# Patient Record
Sex: Female | Born: 1970 | Race: Black or African American | Hispanic: No | Marital: Single | Smoking: Never smoker
Health system: Southern US, Community
[De-identification: ages and names within clinical notes are randomized; demographics above are authoritative.]

## PROBLEM LIST (undated history)

## (undated) DIAGNOSIS — I10 Essential (primary) hypertension: Secondary | ICD-10-CM

## (undated) DIAGNOSIS — I509 Heart failure, unspecified: Secondary | ICD-10-CM

## (undated) DIAGNOSIS — D332 Benign neoplasm of brain, unspecified: Secondary | ICD-10-CM

## (undated) DIAGNOSIS — E119 Type 2 diabetes mellitus without complications: Secondary | ICD-10-CM

## (undated) DIAGNOSIS — E669 Obesity, unspecified: Secondary | ICD-10-CM

## (undated) DIAGNOSIS — E1169 Type 2 diabetes mellitus with other specified complication: Secondary | ICD-10-CM

## (undated) DIAGNOSIS — E282 Polycystic ovarian syndrome: Secondary | ICD-10-CM

## (undated) HISTORY — DX: Type 2 diabetes mellitus with other specified complication: E11.69

## (undated) HISTORY — DX: Polycystic ovarian syndrome: E28.2

## (undated) HISTORY — DX: Obesity, unspecified: E66.9

## (undated) HISTORY — DX: Essential (primary) hypertension: I10

## (undated) HISTORY — DX: Type 2 diabetes mellitus without complications: E11.9

## (undated) HISTORY — DX: Heart failure, unspecified: I50.9

## (undated) HISTORY — PX: OTHER SURGICAL HISTORY: SHX169

## (undated) HISTORY — DX: Benign neoplasm of brain, unspecified: D33.2

---

## 2000-06-25 HISTORY — PX: OTHER SURGICAL HISTORY: SHX169

## 2000-07-24 ENCOUNTER — Inpatient Hospital Stay (HOSPITAL_COMMUNITY): Admission: RE | Admit: 2000-07-24 | Discharge: 2000-07-27 | Payer: Self-pay | Admitting: Neurosurgery

## 2003-03-31 ENCOUNTER — Ambulatory Visit (HOSPITAL_COMMUNITY): Admission: RE | Admit: 2003-03-31 | Discharge: 2003-03-31 | Payer: Self-pay | Admitting: Family Medicine

## 2004-08-01 ENCOUNTER — Ambulatory Visit: Payer: Self-pay | Admitting: Family Medicine

## 2005-02-26 ENCOUNTER — Ambulatory Visit: Payer: Self-pay | Admitting: Family Medicine

## 2005-08-29 ENCOUNTER — Ambulatory Visit: Payer: Self-pay | Admitting: Family Medicine

## 2006-01-22 ENCOUNTER — Ambulatory Visit: Payer: Self-pay | Admitting: Family Medicine

## 2006-02-05 ENCOUNTER — Ambulatory Visit: Payer: Self-pay | Admitting: Family Medicine

## 2006-03-07 ENCOUNTER — Encounter: Payer: Self-pay | Admitting: Family Medicine

## 2006-08-26 ENCOUNTER — Ambulatory Visit: Payer: Self-pay | Admitting: Family Medicine

## 2007-02-24 ENCOUNTER — Ambulatory Visit: Payer: Self-pay | Admitting: Family Medicine

## 2007-02-26 ENCOUNTER — Encounter: Payer: Self-pay | Admitting: Family Medicine

## 2007-06-19 ENCOUNTER — Encounter: Payer: Self-pay | Admitting: Family Medicine

## 2007-06-19 LAB — CONVERTED CEMR LAB
BUN: 13 mg/dL (ref 6–23)
Basophils Absolute: 0 10*3/uL (ref 0.0–0.1)
Basophils Relative: 1 % (ref 0–1)
CO2: 24 meq/L (ref 19–32)
Calcium: 9.5 mg/dL (ref 8.4–10.5)
Chloride: 102 meq/L (ref 96–112)
Cholesterol: 155 mg/dL (ref 0–200)
Creatinine, Ser: 1.02 mg/dL (ref 0.40–1.20)
Eosinophils Absolute: 0.2 10*3/uL (ref 0.0–0.7)
Eosinophils Relative: 3 % (ref 0–5)
Glucose, Bld: 125 mg/dL — ABNORMAL HIGH (ref 70–99)
HCT: 39.4 % (ref 36.0–46.0)
HDL: 38 mg/dL — ABNORMAL LOW (ref >39)
Hemoglobin: 12.7 g/dL (ref 12.0–15.0)
LDL Cholesterol: 88 mg/dL (ref 0–99)
Lymphocytes Relative: 19 % (ref 12–46)
Lymphs Abs: 1.5 10*3/uL (ref 0.7–4.0)
MCHC: 32.2 g/dL (ref 30.0–36.0)
MCV: 82.9 fL (ref 78.0–100.0)
Microalb, Ur: 3.85 mg/dL — ABNORMAL HIGH (ref 0.00–1.89)
Monocytes Absolute: 0.7 10*3/uL (ref 0.1–1.0)
Monocytes Relative: 9 % (ref 3–12)
Neutro Abs: 5.3 10*3/uL (ref 1.7–7.7)
Neutrophils Relative %: 69 % (ref 43–77)
Platelets: 331 10*3/uL (ref 150–400)
Potassium: 4.8 meq/L (ref 3.5–5.3)
RBC: 4.75 M/uL (ref 3.87–5.11)
RDW: 15.1 % (ref 11.5–15.5)
Sodium: 138 meq/L (ref 135–145)
Total CHOL/HDL Ratio: 4.1
Triglycerides: 146 mg/dL (ref <150)
VLDL: 29 mg/dL (ref 0–40)
WBC: 7.8 10*3/uL (ref 4.0–10.5)

## 2007-06-24 DIAGNOSIS — I10 Essential (primary) hypertension: Secondary | ICD-10-CM | POA: Insufficient documentation

## 2007-06-25 ENCOUNTER — Ambulatory Visit: Payer: Self-pay | Admitting: Family Medicine

## 2007-09-04 ENCOUNTER — Ambulatory Visit: Payer: Self-pay | Admitting: Family Medicine

## 2007-09-14 ENCOUNTER — Ambulatory Visit (HOSPITAL_COMMUNITY): Admission: RE | Admit: 2007-09-14 | Discharge: 2007-09-14 | Payer: Self-pay | Admitting: Family Medicine

## 2007-09-14 LAB — HM MAMMOGRAPHY: HM Mammogram: NORMAL

## 2007-11-11 ENCOUNTER — Encounter: Payer: Self-pay | Admitting: Family Medicine

## 2007-12-09 ENCOUNTER — Ambulatory Visit: Payer: Self-pay | Admitting: Family Medicine

## 2007-12-09 DIAGNOSIS — E1169 Type 2 diabetes mellitus with other specified complication: Secondary | ICD-10-CM | POA: Insufficient documentation

## 2007-12-09 DIAGNOSIS — E669 Obesity, unspecified: Secondary | ICD-10-CM

## 2007-12-09 DIAGNOSIS — E119 Type 2 diabetes mellitus without complications: Secondary | ICD-10-CM

## 2007-12-09 HISTORY — DX: Type 2 diabetes mellitus with other specified complication: E11.69

## 2007-12-09 HISTORY — DX: Type 2 diabetes mellitus without complications: E11.9

## 2007-12-09 LAB — HM DIABETES FOOT EXAM

## 2007-12-09 LAB — CONVERTED CEMR LAB
Glucose, Bld: 126 mg/dL
Hgb A1c MFr Bld: 7.5 %

## 2007-12-28 ENCOUNTER — Telehealth: Payer: Self-pay | Admitting: Family Medicine

## 2009-02-25 ENCOUNTER — Emergency Department (HOSPITAL_COMMUNITY): Admission: EM | Admit: 2009-02-25 | Discharge: 2009-02-25 | Payer: Self-pay | Admitting: Emergency Medicine

## 2009-02-25 DIAGNOSIS — D332 Benign neoplasm of brain, unspecified: Secondary | ICD-10-CM

## 2009-02-25 HISTORY — DX: Benign neoplasm of brain, unspecified: D33.2

## 2009-03-24 ENCOUNTER — Telehealth: Payer: Self-pay | Admitting: Family Medicine

## 2009-03-24 ENCOUNTER — Encounter: Payer: Self-pay | Admitting: Family Medicine

## 2009-04-06 ENCOUNTER — Ambulatory Visit: Payer: Self-pay | Admitting: Family Medicine

## 2009-04-19 ENCOUNTER — Ambulatory Visit: Payer: Self-pay | Admitting: Family Medicine

## 2009-04-19 ENCOUNTER — Telehealth: Payer: Self-pay | Admitting: Family Medicine

## 2009-04-19 DIAGNOSIS — R42 Dizziness and giddiness: Secondary | ICD-10-CM | POA: Insufficient documentation

## 2009-04-19 DIAGNOSIS — G4700 Insomnia, unspecified: Secondary | ICD-10-CM | POA: Insufficient documentation

## 2009-04-21 ENCOUNTER — Encounter: Payer: Self-pay | Admitting: Family Medicine

## 2009-07-18 ENCOUNTER — Encounter: Payer: Self-pay | Admitting: Family Medicine

## 2009-07-18 ENCOUNTER — Telehealth (INDEPENDENT_AMBULATORY_CARE_PROVIDER_SITE_OTHER): Payer: Self-pay | Admitting: *Deleted

## 2009-07-26 ENCOUNTER — Telehealth: Payer: Self-pay | Admitting: Family Medicine

## 2009-07-26 ENCOUNTER — Encounter: Payer: Self-pay | Admitting: Family Medicine

## 2009-07-26 ENCOUNTER — Telehealth: Payer: Self-pay | Admitting: Physician Assistant

## 2009-07-27 ENCOUNTER — Encounter: Payer: Self-pay | Admitting: Family Medicine

## 2009-07-28 ENCOUNTER — Telehealth: Payer: Self-pay | Admitting: Physician Assistant

## 2009-08-16 ENCOUNTER — Encounter: Payer: Self-pay | Admitting: Family Medicine

## 2009-08-25 ENCOUNTER — Encounter: Payer: Self-pay | Admitting: Family Medicine

## 2009-09-12 ENCOUNTER — Encounter: Payer: Self-pay | Admitting: Family Medicine

## 2009-09-18 ENCOUNTER — Telehealth: Payer: Self-pay | Admitting: Family Medicine

## 2009-09-28 ENCOUNTER — Encounter: Payer: Self-pay | Admitting: Family Medicine

## 2009-10-11 ENCOUNTER — Encounter: Payer: Self-pay | Admitting: Family Medicine

## 2009-10-25 ENCOUNTER — Encounter: Payer: Self-pay | Admitting: Family Medicine

## 2009-12-04 ENCOUNTER — Telehealth: Payer: Self-pay | Admitting: Family Medicine

## 2009-12-22 ENCOUNTER — Telehealth: Payer: Self-pay | Admitting: Family Medicine

## 2009-12-25 ENCOUNTER — Telehealth: Payer: Self-pay | Admitting: Family Medicine

## 2009-12-27 ENCOUNTER — Telehealth: Payer: Self-pay | Admitting: Family Medicine

## 2010-01-02 ENCOUNTER — Encounter: Payer: Self-pay | Admitting: Family Medicine

## 2010-01-05 ENCOUNTER — Telehealth: Payer: Self-pay | Admitting: Family Medicine

## 2010-01-08 ENCOUNTER — Encounter: Payer: Self-pay | Admitting: Family Medicine

## 2010-02-08 ENCOUNTER — Encounter: Payer: Self-pay | Admitting: Family Medicine

## 2010-02-14 ENCOUNTER — Telehealth: Payer: Self-pay | Admitting: Family Medicine

## 2010-02-16 ENCOUNTER — Emergency Department (HOSPITAL_COMMUNITY)
Admission: EM | Admit: 2010-02-16 | Discharge: 2010-02-17 | Payer: Self-pay | Source: Home / Self Care | Admitting: Emergency Medicine

## 2010-02-20 ENCOUNTER — Telehealth: Payer: Self-pay | Admitting: Family Medicine

## 2010-02-20 ENCOUNTER — Ambulatory Visit
Admission: RE | Admit: 2010-02-20 | Discharge: 2010-02-20 | Payer: Self-pay | Source: Home / Self Care | Attending: Family Medicine | Admitting: Family Medicine

## 2010-02-20 ENCOUNTER — Encounter: Payer: Self-pay | Admitting: Family Medicine

## 2010-02-20 DIAGNOSIS — R059 Cough, unspecified: Secondary | ICD-10-CM | POA: Insufficient documentation

## 2010-02-20 DIAGNOSIS — R05 Cough: Secondary | ICD-10-CM

## 2010-02-22 ENCOUNTER — Ambulatory Visit
Admission: RE | Admit: 2010-02-22 | Discharge: 2010-02-22 | Payer: Self-pay | Source: Home / Self Care | Attending: Internal Medicine | Admitting: Internal Medicine

## 2010-02-27 ENCOUNTER — Encounter: Payer: Self-pay | Admitting: Family Medicine

## 2010-03-06 ENCOUNTER — Encounter: Payer: Self-pay | Admitting: Family Medicine

## 2010-03-13 ENCOUNTER — Encounter: Payer: Self-pay | Admitting: Family Medicine

## 2010-03-19 ENCOUNTER — Telehealth: Payer: Self-pay | Admitting: Family Medicine

## 2010-03-27 ENCOUNTER — Encounter: Payer: Self-pay | Admitting: Family Medicine

## 2010-03-27 NOTE — Letter (Signed)
Summary: lab  lab   Imported By: Curtis Sites 09/21/2009 13:10:49  _____________________________________________________________________  External Attachment:    Type:   Image     Comment:   External Document

## 2010-03-27 NOTE — Progress Notes (Signed)
  Phone Note Call from Patient   Caller: Patient Summary of Call: mostly dry cough but some yellow sputum, head congestion, cough is hurting throat and chest, no fever, no body aches [facility]. [phone] at work Initial call taken by: Adella Hare LPN,  December 25, 2009 8:25 AM  Follow-up for Phone Call        pls advise and erx tessalon perles 100mg .tidcap #30 refill zero, if worsens needs ov Follow-up by: Syliva Overman MD,  December 25, 2009 2:12 PM  Additional Follow-up for Phone Call Additional follow up Details #1::        med sent, called patient, no answer work or home Additional Follow-up by: Adella Hare LPN,  December 25, 2009 3:48 PM    New/Updated Medications: TESSALON PERLES 100 MG CAPS (BENZONATATE) one cap by mouth three times a day Prescriptions: TESSALON PERLES 100 MG CAPS (BENZONATATE) one cap by mouth three times a day  #30 x 0   Entered by:   Adella Hare LPN   Authorized by:   Syliva Overman MD   Signed by:   Adella Hare LPN on 60/45/4098   Method used:   Electronically to        [facility] (retail)       [address]       [place], [place]  [address]       Ph: [phone]       Fax: [phone]   RxID:   [id number]

## 2010-03-27 NOTE — Progress Notes (Signed)
Summary: [facility]  [facility]   Imported By: Lind Guest 10/13/2009 13:42:38  _____________________________________________________________________  External Attachment:    Type:   Image     Comment:   External Document

## 2010-03-27 NOTE — Letter (Signed)
Summary: misc.  misc.   Imported By: Curtis Sites 09/21/2009 13:11:05  _____________________________________________________________________  External Attachment:    Type:   Image     Comment:   External Document

## 2010-03-27 NOTE — Progress Notes (Signed)
Summary: speak with nurse  Phone Note Call from Patient   Summary of Call: needs to speak with nurse about having dizzy neck stiff and heart isn't beating right. tried to give appt. but she wanted to talk with doc about this because they have before. [phone] Initial call taken by: Rudene Anda,  April 19, 2009 2:11 PM  Follow-up for Phone Call        patient coming in today to see PA Follow-up by: Adella Hare LPN,  April 19, 2009 2:18 PM

## 2010-03-27 NOTE — Progress Notes (Signed)
Summary: NEUROSURGERY  NEUROSURGERY   Imported By: Lind Guest 10/04/2009 11:38:02  _____________________________________________________________________  External Attachment:    Type:   Image     Comment:   External Document

## 2010-03-27 NOTE — Progress Notes (Signed)
Summary: brain surgery  Phone Note Call from Patient   Summary of Call: having brain surgery nov 18 at [facility] wanted u to know Initial call taken by: Lind Guest,  December 04, 2009 11:44 AM  Follow-up for Phone Call        pls wishe her all the best for me and thanks for letting meknow Follow-up by: Syliva Overman MD,  December 04, 2009 12:08 PM  Additional Follow-up for Phone Call Additional follow up Details #1::        LEFT MESSAGE

## 2010-03-27 NOTE — Progress Notes (Signed)
Summary: rx  Phone Note Call from Patient   Summary of Call: left message needs her medicines refilled that was sent over there Initial call taken by: Lind Guest,  December 22, 2009 1:08 PM    Prescriptions: ZOLPIDEM TARTRATE 5 MG TABS (ZOLPIDEM TARTRATE) 1 hs as needed insomna  #90 x 0   Entered by:   Adella Hare LPN   Authorized by:   Syliva Overman MD   Signed by:   Adella Hare LPN on 27/25/3664   Method used:   Printed then faxed to ...       [facility] (retail)       [address]       [place], [place]  [address]       Ph: [phone]       Fax: [phone]   RxID:   [id number] AZOR 10-40 MG TABS (AMLODIPINE-OLMESARTAN) Take 1 tablet by mouth once a day  #90 x 0   Entered by:   Adella Hare LPN   Authorized by:   Syliva Overman MD   Signed by:   Adella Hare LPN on 35/57/3220   Method used:   Electronically to        [facility] (retail)       [address]       [place], [place]  [address]       Ph: [phone]       Fax: [phone]   RxID:   [id number] GLIPIZIDE 5 MG  TABS (GLIPIZIDE) one tab by mouth two times a day  #180 x 0   Entered by:   Adella Hare LPN   Authorized by:   Syliva Overman MD   Signed by:   Adella Hare LPN on 00/93/8182   Method used:   Electronically to        [facility] (retail)       [address]       [place], [place]  [address]       Ph: [phone]       Fax: [phone]   RxID:   [id number] KLOR-CON M20 20 MEQ  TBCR (POTASSIUM CHLORIDE CRYS CR) two tabs by mouth once daily  #180 x 0   Entered by:   Adella Hare LPN   Authorized by:   Syliva Overman MD   Signed by:   Adella Hare LPN on 40/09/6759   Method used:   Electronically to        [facility] (retail)       [address]       [place], [place]  [address]       Ph: [phone]       Fax: [phone]   RxID:    [id number] CLONIDINE HCL 0.3 MG  TABS (CLONIDINE HCL) two tabs by mouth three times a day  #540 x 0   Entered by:   Adella Hare LPN   Authorized by:   Syliva Overman MD   Signed by:   Adella Hare LPN on 73/53/2992   Method used:   Electronically to        [facility] (retail)       [address]       [place], [place]  [address]       Ph: [phone]       Fax: [phone]   RxID:   [id number] LASIX 40 MG  TABS (FUROSEMIDE) one tab by mouth two times a day  #180 x 0   Entered by:   Adella Hare LPN   Authorized by:   Syliva Overman MD   Signed by:   Adella Hare LPN on 84/13/2440   Method used:   Electronically to        [facility] (retail)       [address]       [place], [place]  [address]       Ph: [phone]       Fax: [phone]   RxID:   [id number]

## 2010-03-27 NOTE — Medication Information (Signed)
Summary: Tax adviser   Imported By: Lind Guest 01/08/2010 17:15:11  _____________________________________________________________________  External Attachment:    Type:   Image     Comment:   External Document

## 2010-03-27 NOTE — Progress Notes (Signed)
Summary: PRE AUTH #  Phone Note Call from Patient   Summary of Call: PRE AUTHORIZATION  #   IS  [id number] FOR THE MRI AND CT  GOOD THROUGH 6.1.11 -  6.30.11  AND THIS IS NOT A GUAR. OF PAYMENT  IF U NEED TO CALL BACK AT [phone] Initial call taken by: Lind Guest,  July 26, 2009 3:58 PM  Follow-up for Phone Call        pt going to wait and see what dr. Lovell Sheehan says before doing this. and if her recommends she will have him to do it.  Follow-up by: Rudene Anda,  July 27, 2009 11:39 AM

## 2010-03-27 NOTE — Letter (Signed)
Summary: history and physical  history and physical   Imported By: Curtis Sites 09/21/2009 13:10:32  _____________________________________________________________________  External Attachment:    Type:   Image     Comment:   External Document

## 2010-03-27 NOTE — Progress Notes (Signed)
Summary: Christina Davenport  Christina Davenport   Imported By: Lind Guest 09/15/2009 15:15:34  _____________________________________________________________________  External Attachment:    Type:   Image     Comment:   External Document

## 2010-03-27 NOTE — Letter (Signed)
Summary: demographic  demographic   Imported By: Curtis Sites 09/21/2009 13:10:14  _____________________________________________________________________  External Attachment:    Type:   Image     Comment:   External Document

## 2010-03-27 NOTE — Medication Information (Signed)
Summary: Tax adviser   Imported By: Lind Guest 04/21/2009 14:46:10  _____________________________________________________________________  External Attachment:    Type:   Image     Comment:   External Document

## 2010-03-27 NOTE — Assessment & Plan Note (Signed)
Summary: office visit   Vital Signs:  Patient profile:   40 year old female Height:      65 inches Weight:      332.25 pounds BMI:     55.49 O2 Sat:      95 % Pulse rate:   100 / minute Pulse rhythm:   regular Resp:     16 per minute BP sitting:   140 / 72  (right arm)  Vitals Entered By: Everitt Amber (April 06, 2009 3:20 PM)  Nutrition Counseling: Patient's BMI is greater than 25 and therefore counseled on weight management options. CC: Follow up chronic problems Is Patient Diabetic? Yes   CC:  Follow up chronic problems.  History of Present Illness: See in the Ed due to facial twitching and palpitations, on 02/25/2009, the twitching is allegedly a possible afterv effect from brain surgery, she has had no more episodes and states she will be walking regularly as this prevents the twitch. Reports  that she has been doing well. 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 Denies recent fever or chills. Denies sinus pressure, nasal congestion , ear pain or sore throat. Denies chest congestion, or cough productive of sputum. Denies chest pain, palpitations, PND, orthopnea or leg swelling. Denies abdominal pain, nausea, vomitting, diarrhea or constipation. Denies change in bowel movements or bloody stool. Denies dysuria , frequency, incontinence or hesitancy. Denies  joint pain, swelling, or reduced mobility. Denies headaches, vertigo, seizures. Denies depression, anxiety or insomnia. Denies  rash, lesions, or itch. she has not been exerfcising and has not lost weight as she had planned, she did recently invbest in a treadmill and intends to change this     Current Medications (verified): 1)  Lasix 40 Mg  Tabs (Furosemide) .... One Tab By Mouth Two Times A Day 2)  Clonidine Hcl 0.3 Mg  Tabs (Clonidine Hcl) .... Two Tabs By Mouth Three Times A Day 3)  Klor-Con M20 20 Meq  Tbcr (Potassium Chloride Crys Cr) .... Two Tabs By Mouth Once  Daily 4)  Glipizide 5 Mg  Tabs (Glipizide) .... One Tab By Mouth Two Times A Day 5)  Azor 10-40 Mg Tabs (Amlodipine-Olmesartan) .... Take 1 Tablet By Mouth Once A Day  Allergies (verified): 1)  ! Vasotec 2)  ! Glucophage  Review of Systems      See HPI Eyes:  Denies blurring and discharge. Derm:  Denies lesion(s) and rash. Neuro:  Denies headaches, seizures, and sensation of room spinning; neew  onset left facial twitch, eye, lower lip, post left neck intermittent since Jan 2011. Psych:  Denies anxiety and depression. Endo:  Denies cold intolerance, excessive hunger, excessive thirst, excessive urination, heat intolerance, polyuria, and weight change; tests daily fastings, range 110 to 127. Heme:  Denies abnormal bruising and bleeding. Allergy:  Denies hives or rash.  Physical Exam  General:  Well-developed,morbidly obese,in no acute distress; alert,appropriate and cooperative throughout examination HEENT: No facial asymmetry,  EOMI, No sinus tenderness, TM's Clear, oropharynx  pink and moist.   Chest: Clear to auscultation bilaterally.  CVS: S1, S2, No murmurs, No S3.   Abd: Soft, Nontender.  MS: Adequate ROM spine, hips, shoulders and knees.  Ext: No edema.   CNS: CN 2-12 intact, power tone and sensation normal throughout.   Skin: Intact, no visible lesions or rashes.  Psych: Good eye contact, normal affect.  Memory intact, not anxious or depressed appearing.    Impression &  Recommendations:  Problem # 1:  DIABETES MELLITUS, TYPE II (ICD-250.00) Assessment Improved  Her updated medication list for this problem includes:    Glipizide 5 Mg Tabs (Glipizide) ..... One tab by mouth two times a day    Azor 10-40 Mg Tabs (Amlodipine-olmesartan) .Marland Kitchen... Take 1 tablet by mouth once a day  Labs Reviewed: Creat: 1.02 (06/19/2007)    Reviewed HgBA1c results: 5.4 (04/06/2009)  7.5 (12/09/2007)  Problem # 2:  OBESITY (ICD-278.00) Assessment: Unchanged  Ht: 65 (04/06/2009)   Wt:  332.25 (04/06/2009)   BMI: 55.49 (04/06/2009)  Problem # 3:  HYPERTENSION (ICD-401.9) Assessment: Improved  Her updated medication list for this problem includes:    Lasix 40 Mg Tabs (Furosemide) ..... One tab by mouth two times a day    Clonidine Hcl 0.3 Mg Tabs (Clonidine hcl) .Marland Kitchen..Marland Kitchen Two tabs by mouth three times a day    Azor 10-40 Mg Tabs (Amlodipine-olmesartan) .Marland Kitchen... Take 1 tablet by mouth once a day  Orders: T-Basic Metabolic Panel ([address])  BP today: 140/72 Prior BP: 142/94 (12/09/2007)  Labs Reviewed: K+: 4.8 (06/19/2007) Creat: : 1.02 (06/19/2007)   Chol: 155 (06/19/2007)   HDL: 38 (06/19/2007)   LDL: 88 (06/19/2007)   TG: 146 (06/19/2007)  Complete Medication List: 1)  Lasix 40 Mg Tabs (Furosemide) .... One tab by mouth two times a day 2)  Clonidine Hcl 0.3 Mg Tabs (Clonidine hcl) .... Two tabs by mouth three times a day 3)  Klor-con M20 20 Meq Tbcr (Potassium chloride crys cr) .... Two tabs by mouth once daily 4)  Glipizide 5 Mg Tabs (Glipizide) .... One tab by mouth two times a day 5)  Azor 10-40 Mg Tabs (Amlodipine-olmesartan) .... Take 1 tablet by mouth once a day  Other Orders: Hemoglobin A1C ([address]) T-Lipid Profile ([address]21-30865) T-CBC w/Diff ([id number]) T-Urine Microalbumin w/creat. ratio [id number])  Patient Instructions: 1)  F/u in 5 months and 3.5 weeks 2)  It is important that you exercise regularly at least 20 minutes 5 times a week. If you develop chest pain, have severe difficulty breathing, or feel very tired , stop exercising immediately and seek medical attention. 3)  You need to lose weight. Consider a lower calorie diet and regular exercise.  4)  BMP prior to visit, ICD-9: 5)  Lipid Panel prior to visit, ICD-9:  fasting asap 6)  CBC w/ Diff prior to visit, ICD-9: 7)  Urine Microalbumin prior to visit, ICD-9: 8)  Your sugar is great keep it up  Laboratory Results   Blood Tests   Date/Time Received: April 06, 2009  Date/Time  Reported: April 06, 2009   HGBA1C: 5.4%   (Normal Range: Non-Diabetic - 3-6%   Control Diabetic - 6-8%)

## 2010-03-27 NOTE — Letter (Signed)
Summary: progress notes  progress notes   Imported By: Curtis Sites 09/21/2009 13:11:45  _____________________________________________________________________  External Attachment:    Type:   Image     Comment:   External Document

## 2010-03-27 NOTE — Progress Notes (Signed)
Summary: APPT  Phone Note Call from Patient   Summary of Call: YOU GAVE ME HER LAB SHEET AND SAID NEEDS OV AND I CALLED HER AND SHE RETURNED MY CALL THIS MORNING AND SHE IS HAVING BRAIN SURGERY THURSDAY  AND SHE SAID SHE HAD TOLD YOU SEVERAL TIMES AND THAT THE OTHER DRS HD TOLD YOU WAS THERE SOMETHING  THAT SHE NEEDED TO KNOW  Initial call taken by: Lind Guest,  January 05, 2010 9:13 AM  Follow-up for Phone Call        spoke with pt, dose inc sent in for her glipizide due to uncontrolled blood sugars, she is aware. Follow-up by: Syliva Overman MD,  January 05, 2010 12:04 PM  Additional Follow-up for Phone Call Additional follow up Details #1::        pls call pt at her work for  an appt i 4 months , she is expecting the call Additional Follow-up by: Syliva Overman MD,  January 05, 2010 12:05 PM    Additional Follow-up for Phone Call Additional follow up Details #2::    appt made for 3.21.12 @ 3:00 Follow-up by: Lind Guest,  January 05, 2010 3:31 PM  New/Updated Medications: GLIPIZIDE 5 MG TABS (GLIPIZIDE) one and a half tablets twice daily Prescriptions: GLIPIZIDE 5 MG TABS (GLIPIZIDE) one and a half tablets twice daily  #270 x 1   Entered and Authorized by:   Syliva Overman MD   Signed by:   Syliva Overman MD on 01/05/2010   Method used:   Printed then faxed to ...       [facility] (retail)       [address]       [place], [place]  [address]       Ph: [phone]       Fax: [phone]   RxID:   [id number]

## 2010-03-27 NOTE — Progress Notes (Signed)
Summary: fyi  Phone Note Call from Patient   Summary of Call: patient called in this morning, states she has tried to get in since Monday, she has something going on cold like symptoms, she is scheduled for brain surgery on the 18th of this month.  She is going to urgent care this morning, so that the antibiotics they prescribe will be out of her system before her surgery.  She did say that Dr. Lodema Hong called her in something for a cough, but she is going to Urgent care. Initial call taken by: Curtis Sites,  December 27, 2009 8:24 AM  Follow-up for Phone Call        NOTED, THE INITIAL MSG WAS NOT TO BE SEEN HERE Follow-up by: Syliva Overman MD,  December 27, 2009 8:12 PM

## 2010-03-27 NOTE — Letter (Signed)
Summary: Appt with Dr. Lovell Sheehan  Appt with Dr. Lovell Sheehan   Imported By: Rudene Anda 07/27/2009 11:44:21  _____________________________________________________________________  External Attachment:    Type:   Image     Comment:   External Document

## 2010-03-27 NOTE — Assessment & Plan Note (Signed)
Summary: sick- room 2   Vital Signs:  Patient profile:   40 year old female Height:      65 inches Weight:      326.50 pounds BMI:     54.53 O2 Sat:      100 % Pulse rate:   100 / minute Resp:     16 per minute BP supine:   160 / 90  (left arm) BP sitting:   140 / 84  (left arm) BP standing:   140 / 80  (left arm)  Vitals Entered By: Adella Hare LPN (April 19, 2009 3:34 PM)  Nutrition Counseling: Patient's BMI is greater than 25 and therefore counseled on weight management options. CC: dizzy, insomnia, neck pain Is Patient Diabetic? Yes Did you bring your meter with you today? Yes Pain Assessment Patient in pain? yes     Location: neck Intensity: 6 Type: stiffness Onset of pain  Constant   CC:  dizzy, insomnia, and neck pain.  History of Present Illness: Pt is in today with complaints of unable to sleep since January of this year.  No prev hx of insomnia.  States she has probs falling asleep, and will lie in bed for a couple of hrs before she falls asleep.  Then awakens early am.  Usually about 4:00.  Awakens suddenly with mind racing about things she might need to do, etc.  Pt has a hx of dizziness x yrs.  Mostly with change of positions.  She has had Antivert in the past which did seem to help.  Pt has been exercising regulaly.  Doing a mile per day on her treadmill.  She is working full time, and in Jan started classes for [employer].  She doesn't feel overwhelmed with this.   Current Medications (verified): 1)  Lasix 40 Mg  Tabs (Furosemide) .... One Tab By Mouth Two Times A Day 2)  Clonidine Hcl 0.3 Mg  Tabs (Clonidine Hcl) .... Two Tabs By Mouth Three Times A Day 3)  Klor-Con M20 20 Meq  Tbcr (Potassium Chloride Crys Cr) .... Two Tabs By Mouth Once Daily 4)  Glipizide 5 Mg  Tabs (Glipizide) .... One Tab By Mouth Two Times A Day 5)  Azor 10-40 Mg Tabs (Amlodipine-Olmesartan) .... Take 1 Tablet By Mouth Once A Day  Allergies (verified): 1)  ! Vasotec 2)  !  Glucophage PMH-FH-SH reviewed for relevance  Review of Systems General:  Denies chills, fatigue, and fever. Eyes:  Denies blurring and double vision. ENT:  Denies earache, sinus pressure, and sore throat. CV:  Denies chest pain or discomfort. Resp:  Denies shortness of breath. Neuro:  Denies numbness and tingling. Psych:  Denies anxiety and depression.  Physical Exam  General:  Well-developed,well-nourished,in no acute distress; alert,appropriate and cooperative throughout examination Head:  Normocephalic and atraumatic without obvious abnormalities. No apparent alopecia or balding. Ears:  External ear exam shows no significant lesions or deformities.  Otoscopic examination reveals clear canals, tympanic membranes are intact bilaterally without bulging, retraction, inflammation or discharge. Hearing is grossly normal bilaterally. Nose:  External nasal examination shows no deformity or inflammation. Nasal mucosa are pink and moist without lesions or exudates. Mouth:  Oral mucosa and oropharynx without lesions or exudates.  Teeth in good repair. Neck:  No deformities, masses, or tenderness noted. Lungs:  Normal respiratory effort, chest expands symmetrically. Lungs are clear to auscultation, no crackles or wheezes. Heart:  Normal rate and regular rhythm. S1 and S2 normal without gallop, murmur, click, rub  or other extra sounds. Cervical Nodes:  No lymphadenopathy noted Psych:  Cognition and judgment appear intact. Alert and cooperative with normal attention span and concentration. No apparent delusions, illusions, hallucinations   Impression & Recommendations:  Problem # 1:  INSOMNIA (ICD-780.52) Assessment New Discussed with pt probably due to increase in stress.  Will use Ambien as needed, not nightly.  Her updated medication list for this problem includes:    Zolpidem Tartrate 5 Mg Tabs (Zolpidem tartrate) .Marland Kitchen... 1 hs as needed insomna  Problem # 2:  DIZZINESS, CHRONIC  (ICD-780.4) Assessment: Unchanged  Her updated medication list for this problem includes:    Meclizine Hcl 25 Mg Tabs (Meclizine hcl) .Marland Kitchen... 1/2 - 1 tab q 8 hrs as needed dizziness  Problem # 3:  HYPERTENSION (ICD-401.9) Assessment: Unchanged  Her updated medication list for this problem includes:    Lasix 40 Mg Tabs (Furosemide) ..... One tab by mouth two times a day    Clonidine Hcl 0.3 Mg Tabs (Clonidine hcl) .Marland Kitchen..Marland Kitchen Two tabs by mouth three times a day    Azor 10-40 Mg Tabs (Amlodipine-olmesartan) .Marland Kitchen... Take 1 tablet by mouth once a day  BP today: 140/84 Prior BP: 140/72 (04/06/2009)  Labs Reviewed: K+: 4.8 (06/19/2007) Creat: : 1.02 (06/19/2007)   Chol: 155 (06/19/2007)   HDL: 38 (06/19/2007)   LDL: 88 (06/19/2007)   TG: 146 (06/19/2007)  Problem # 4:  OBESITY (ICD-278.00) Assessment: Improved Pt has lost 6 lbs since her prev visit. Congratulated her & encouraged her to continue.  Complete Medication List: 1)  Lasix 40 Mg Tabs (Furosemide) .... One tab by mouth two times a day 2)  Clonidine Hcl 0.3 Mg Tabs (Clonidine hcl) .... Two tabs by mouth three times a day 3)  Klor-con M20 20 Meq Tbcr (Potassium chloride crys cr) .... Two tabs by mouth once daily 4)  Glipizide 5 Mg Tabs (Glipizide) .... One tab by mouth two times a day 5)  Azor 10-40 Mg Tabs (Amlodipine-olmesartan) .... Take 1 tablet by mouth once a day 6)  Zolpidem Tartrate 5 Mg Tabs (Zolpidem tartrate) .Marland Kitchen.. 1 hs as needed insomna 7)  Meclizine Hcl 25 Mg Tabs (Meclizine hcl) .... 1/2 - 1 tab q 8 hrs as needed dizziness  Patient Instructions: 1)  It is important that you exercise regularly at least 20 minutes 5 times a week. If you develop chest pain, have severe difficulty breathing, or feel very tired , stop exercising immediately and seek medical attention. 2)  Continue with weight loss.  Congratulations! 3)  I have sent your prescriptions to the pharmacy for you. 4)  Keep your follow up appt.  We will see you sooner  if your problems persist. Prescriptions: MECLIZINE HCL 25 MG TABS (MECLIZINE HCL) 1/2 - 1 tab q 8 hrs as needed dizziness  #30 x 1   Entered and Authorized by:   Esperanza Sheets PA   Signed by:   Esperanza Sheets PA on 04/19/2009   Method used:   Printed then faxed to ...       [facility] (retail)       [address]       [place], [place]  [address]       Ph: [phone]       Fax: [phone]   RxID:   [id number] ZOLPIDEM TARTRATE 5 MG TABS (ZOLPIDEM TARTRATE) 1 hs as needed insomna  #30 x 1   Entered and  Authorized by:   Esperanza Sheets PA   Signed by:   Esperanza Sheets PA on 04/19/2009   Method used:   Printed then faxed to ...       [facility] ([facility]ail)       [address]       [place], [place]  [address]       Ph: [phone]       Fax: [phone]   RxID:   [id number]

## 2010-03-27 NOTE — Progress Notes (Signed)
Summary: CALL  Phone Note Call from Patient   Summary of Call: WANTS YOU TO CALL HER AT [phone] Initial call taken by: Lind Guest,  September 18, 2009 1:30 PM  Follow-up for Phone Call        patient is preparing for brain surgery, has been unable to come in for ov due to multiple neuro appts, needs refills, advised will send 90 scripts to give time for recovery Follow-up by: Adella Hare LPN,  September 18, 2009 5:07 PM    Prescriptions: ZOLPIDEM TARTRATE 5 MG TABS (ZOLPIDEM TARTRATE) 1 hs as needed insomna  #90 x 0   Entered by:   Adella Hare LPN   Authorized by:   Syliva Overman MD   Signed by:   Adella Hare LPN on 11/91/4782   Method used:   Printed then faxed to ...       [facility] ([facility]ail[facility]       [address]       [place], [place]  [address]       Ph: [phone]       Fax: [phone]   RxID:   [id number] AZOR 10-40 MG TABS (AMLODIPINE-OLMESARTAN) Take 1 tablet by mouth once a day  #90 x 0   Entered by:   Adella Hare LPN   Authorized by:   Syliva Overman MD   Signed by:   Adella Hare LPN on 47/42/5956   Method used:   Electronically to        [facility] (retail)       [address]       [place], [place]  [address]       Ph: [phone]       Fax: [phone]   RxID:   [id number] GLIPIZIDE 5 MG  TABS (GLIPIZIDE) one tab by mouth two times a day  #180 x 0   Entered by:   Adella Hare LPN   Authorized by:   Syliva Overman MD   Signed by:   Adella Hare LPN on 27/07/2374   Method used:   Electronically to        [facility] (retail)       [address]       [place], [place]  [address]       Ph: [phone]       Fax: [phone]   RxID:   [id number] KLOR-CON M20 20 MEQ  TBCR (POTASSIUM CHLORIDE CRYS CR) two tabs by mouth once daily  #180 x 0   Entered by:   Adella Hare LPN   Authorized by:   Syliva Overman MD   Signed by:   Adella Hare LPN on 71/69/6789   Method used:   Electronically to        [facility] [address] (retail)       [address]       [place], [place]  [address]       Ph: [phone]       Fax: [phone]   RxID:   [id number] CLONIDINE HCL 0.3 MG  TABS (CLONIDINE HCL) two tabs by mouth three times a day  #540 x 0   Entered by:   Adella Hare LPN   Authorized by:   Syliva Overman MD  Signed by:   Adella Hare LPN on 95/10/3265   Method used:   Electronically to        [facility] (retail)       [address]       [place], [place]  [address]       Ph: [phone]       Fax: [phone]   RxID:   [id number] LASIX 40 MG  TABS (FUROSEMIDE) one tab by mouth two times a day  #180 x 0   Entered by:   Adella Hare LPN   Authorized by:   Syliva Overman MD   Signed by:   Adella Hare LPN on 42/68/3419   Method used:   Electronically to        [facility] [address] (retail)       [address]       [place], [place]  [address]       Ph: [phone]       Fax: [phone]   RxID:   [id number]

## 2010-03-27 NOTE — Letter (Signed)
Summary: interventional radiology  interventional radiology   Imported By: Lind Guest 01/08/2010 16:50:41  _____________________________________________________________________  External Attachment:    Type:   Image     Comment:   External Document

## 2010-03-27 NOTE — Progress Notes (Signed)
  Phone Note Other Incoming   Summary of Call: Phone call from radiologist.  Pt had MRI brain last night. Shows a 4 cm mass Lt, by brainstem, with shift.  Needs 1) an MRI with contrast, and 2) temporal bone CT.  These are needed ASAP Also refer pt to Neurosurg for consult. Dx: Brain mass, New onset HA, & abn vision exam. Initial call taken by: Esperanza Sheets PA,  July 26, 2009 9:52 AM     Appended Document:  Discussed results with Pt. She states she has not been having headaches.  She has been experiencing visual changes, and problems with balance and clumsiness.  Appended Document:  pt has appt with dr. Lovell Sheehan for 08/16/2009 9:50. pt is aware and said that she didn't want to to another MRI with contrast are a temprol ct scan. She stated dr. Lovell Sheehan done her last surgery and she would like to see him first before she does any of this. And if dr. Lovell Sheehan thinks it needs to be done then he will do it. Told pt I would let you know and Dr. Lodema Hong

## 2010-03-27 NOTE — Progress Notes (Signed)
  Phone Note Outgoing Call   Summary of Call: Called Dr Lovell Sheehan office. l spoke with Suzie who states Dr Lovell Sheehan did review the pts information, and MRI and does not feel that she needs an appt before 08-16-09. Initial call taken by: Esperanza Sheets PA,  July 28, 2009 10:09 AM

## 2010-03-27 NOTE — Letter (Signed)
Summary: phone notes  phone notes   Imported By: Curtis Sites 09/21/2009 13:11:27  _____________________________________________________________________  External Attachment:    Type:   Image     Comment:   External Document

## 2010-03-27 NOTE — Progress Notes (Signed)
Summary: [facility]  [facility]   Imported By: Lind Guest 09/04/2009 13:48:48  _____________________________________________________________________  External Attachment:    Type:   Image     Comment:   External Document

## 2010-03-27 NOTE — Progress Notes (Signed)
  Phone Note Other Incoming   Caller: dr Ila Landowski Summary of Call: this pt has not been in the office since 10/09. We have made 2 unsuccesful attempts to contact her, 2 msgs have been left for her to call the office fplor an appt with no response, [facility] is requesting a 90 day supply of meds which I am unablet to fill, due to the fact that she has not been evaluatd in over 1 yr.  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

## 2010-03-27 NOTE — Consult Note (Signed)
Summary: NEUOSURGICAL  NEUOSURGICAL   Imported By: Lind Guest 08/24/2009 09:16:42  _____________________________________________________________________  External Attachment:    Type:   Image     Comment:   External Document

## 2010-03-27 NOTE — Progress Notes (Signed)
Summary: eye exam  eye exam   Imported By: Lind Guest 07/25/2009 08:45:48  _____________________________________________________________________  External Attachment:    Type:   Image     Comment:   External Document

## 2010-03-27 NOTE — Progress Notes (Signed)
Summary: wanting a MRI  Phone Note Call from Patient   Summary of Call: pt would like to get a open MRI of head. Said she went to eye doc and told her she had muscles in eye not tracking like they should. said maybe came from where she had surgery in past. please call her back. [phone] Initial call taken by: Rudene Anda,  Jul 18, 2009 1:40 PM  Follow-up for Phone Call        ok to refer her for mRI of brain , new headaches , abn eye exam, s/p brain surgery for chiari malformation, pls let her know , if you can order pls do, if not have them know that they will geta call on thursday about it Follow-up by: Syliva Overman MD,  Jul 18, 2009 5:14 PM  Additional Follow-up for Phone Call Additional follow up Details #1::        not sure if [facility] needs precert will forward to [facility] and let patient know  called patient, left message Additional Follow-up by: Adella Hare LPN,  Jul 19, 2009 10:42 AM    Additional Follow-up for Phone Call Additional follow up Details #2::    Patient aware that precert has to be done first to see if they will cover. Told her we would call her back after that  Follow-up by: Everitt Amber LPN,  Jul 19, 2009 11:10 AM   Appended Document: wanting a MRI pt has a appt at [facility] for 07/21/2009 2:30. called pt and left message for her. and if she had any questions to please call back.   Appended Document: wanting a MRI spoke with pt and she is having test at [facility] at 8pm, she rescheduled,  pLS f/u on therept and directlygive to salllie if abn to fax to the pt's neurosurgeon for an earlier appt than she was able to make 9approx 1 mth out0 She has had arnold chiari brain surgery in the past and a recent opthal eval suggested that she needed urgent neurosurg eval, she was seen last week by them, thanks  pt has been asked to contact the officeWed afternoon if she has not heard from [place] about this , it is impt

## 2010-03-29 NOTE — Progress Notes (Signed)
Summary: change pills to liquid  Phone Note Call from Patient   Summary of Call: pt needs to speak with nurse about brain surgery. needs her medicines in liquid form. [phone] Initial call taken by: Rudene Anda,  March 19, 2010 2:10 PM  Follow-up for Phone Call        unable to swallow tabs of potassium, will cjange to liquid, she is aware Follow-up by: Syliva Overman MD,  March 19, 2010 5:19 PM    New/Updated Medications: POTASSIUM CHLORIDE 20 MEQ/15ML (10%) LIQD (POTASSIUM CHLORIDE) 30 ml once daily, stop potassium tablets effective 03/19/2010 Prescriptions: POTASSIUM CHLORIDE 20 MEQ/15ML (10%) LIQD (POTASSIUM CHLORIDE) 30 ml once daily, stop potassium tablets effective 03/19/2010  #935ml x 3   Entered and Authorized by:   Syliva Overman MD   Signed by:   Syliva Overman MD on 03/19/2010   Method used:   Electronically to        [facility] (retail)       [address]       [place], [place]  [address]       Ph: [phone]       Fax: [phone]   RxID:   [id number]

## 2010-03-29 NOTE — Letter (Signed)
Summary: otolaryngology  otolaryngology   Imported By: Lind Guest 03/20/2010 11:07:01  _____________________________________________________________________  External Attachment:    Type:   Image     Comment:   External Document

## 2010-03-29 NOTE — Letter (Signed)
Summary: Discharge Summary  Discharge Summary   Imported By: Lind Guest 03/19/2010 15:39:50  _____________________________________________________________________  External Attachment:    Type:   Image     Comment:   External Document

## 2010-03-29 NOTE — Letter (Signed)
Summary: Discharge Summary  Discharge Summary   Imported By: Lind Guest 03/15/2010 14:56:51  _____________________________________________________________________  External Attachment:    Type:   Image     Comment:   External Document

## 2010-03-29 NOTE — Letter (Signed)
Summary: appt from [facility]  appt from [facility]   Imported By: Lind Guest 02/20/2010 14:37:12  _____________________________________________________________________  External Attachment:    Type:   Image     Comment:   External Document

## 2010-03-29 NOTE — Progress Notes (Signed)
Summary: SPEECH  Phone Note Call from Patient   Summary of Call: Christina Davenport with [facility] wants verbal order to continue with speech therpy call back at [phone] Initial call taken by: Lind Guest,  February 14, 2010 3:55 PM  Follow-up for Phone Call        returned call, left message Follow-up by: Adella Hare LPN,  February 14, 2010 4:16 PM  Additional Follow-up for Phone Call Additional follow up Details #1::        advised okay Additional Follow-up by: Adella Hare LPN,  February 14, 2010 4:22 PM

## 2010-03-29 NOTE — Assessment & Plan Note (Signed)
Summary: cough   Vital Signs:  Patient profile:   40 year old female Height:      65 inches Weight:      305.75 pounds BMI:     51.06 O2 Sat:      96 % on Room air Pulse rate:   111 / minute Pulse rhythm:   regular Resp:     16 per minute BP sitting:   110 / 70  (left arm)  Vitals Entered By: Adella Hare LPN (February 20, 2010 10:47 AM)  Nutrition Counseling: Patient's BMI is greater than 25 and therefore counseled on weight management options.  O2 Flow:  Room air CC: dry cough Is Patient Diabetic? Yes Did you bring your meter with you today? No   CC:  dry cough.  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 She has h/h for speech and physical therapy.  She was seen in the ed on 12/23 and was told that herPEG is leaking, needs local gI involvement.  Dry hacking cough since mid nov, preveent s her from sleeping, no fever or chills.Her CXR at the Ed this past weekend is clear, and she has recently been on multiple antibiotics No upper respiratory symptoms. No urinary symptoms  Current Medications (verified): 1)  Lasix 40 Mg  Tabs (Furosemide) .... One Tab By Mouth Two Times A Day 2)  Clonidine Hcl 0.3 Mg  Tabs (Clonidine Hcl) .... Two Tabs By Mouth Three Times A Day 3)  Klor-Con M20 20 Meq  Tbcr (Potassium Chloride Crys Cr) .... Two Tabs By Mouth Once Daily 4)  Azor 10-40 Mg Tabs (Amlodipine-Olmesartan) .... Take 1 Tablet By Mouth Once A Day 5)  Zolpidem Tartrate 5 Mg Tabs (Zolpidem Tartrate) .Marland Kitchen.. 1 Hs As Needed Insomna 6)  Meclizine Hcl 25 Mg Tabs (Meclizine Hcl) .... 1/2 - 1 Tab Q 8 Hrs As Needed Dizziness 7)  Glipizide 5 Mg Tabs (Glipizide)  .... One and A Half Tablets Twice Daily  Allergies (verified): 1)  ! Vasotec 2)  ! Glucophage  Review of Systems      See HPI General:  Complains of fatigue and weakness. Eyes:  Complains of vision loss-1 eye; left eye. ENT:  Denies hoarseness, nasal congestion, postnasal drainage, and sinus pressure. CV:  Denies chest pain or discomfort, palpitations, and swelling of hands. Resp:  Complains of cough; denies sputum productive and wheezing. GI:  Denies abdominal pain, constipation, diarrhea, nausea, and vomiting; feelspeg is leaking. GU:  Denies dysuria and urinary frequency. MS:  Complains of muscle weakness. Derm:  Denies itching and rash. Neuro:  Complains of visual disturbances and weakness. Psych:  Complains of anxiety and depression; denies mental problems, suicidal thoughts/plans, thoughts of violence, and unusual visions or sounds; overwhelmed understandably by the multitude of complications. Endo:  Denies cold intolerance, excessive thirst, excessive urination, and heat intolerance. Heme:  Denies abnormal bruising and bleeding. Allergy:  Denies hives or rash and itching eyes.  Physical Exam  General:  Well-developed,obese,in no acute distress; alert,appropriate and cooperative throughout examination HEENT: No facial asymmetry,  EOMI, No sinus tenderness, TM's Clear, oropharynx  pink and moist.   Chest: Clear to auscultation bilaterally.  CVS:  S1, S2, No murmurs, No S3.   Abd: Soft, Nontender. Dry dressing at PEG site MS: Adequate ROM spine, hips, shoulders and knees.  Ext: No edema.   CNS: left facial weakness, power tone and sensation normal throughout.   Skin: Intact, no visible lesions or rashes.  Psych: Good eye contact, normal affect.  Memory intact, mildly  depressed appearing.    Impression & Recommendations:  Problem # 1:  COUGH (ICD-786.2) Assessment Comment Only tussionex prescribed, no evidence of infection  Problem # 2:  DIABETES MELLITUS, TYPE II  (ICD-250.00)  The following medications were removed from the medication list:    Glipizide 5 Mg Tabs (Glipizide) ..... One tab by mouth two times a day Her updated medication list for this problem includes:    Azor 10-40 Mg Tabs (Amlodipine-olmesartan) .Marland Kitchen... Take 1 tablet by mouth once a day    Glipizide 5 Mg Tabs (Glipizide) ..... One and a half tablets twice daily HBA1C at next OV Orders: Gastroenterology Referral (GI)  Problem # 3:  HYPERTENSION (ICD-401.9) Assessment: Improved  Her updated medication list for this problem includes:    Lasix 40 Mg Tabs (Furosemide) ..... One tab by mouth two times a day    Clonidine Hcl 0.3 Mg Tabs (Clonidine hcl) .Marland Kitchen..Marland Kitchen Two tabs by mouth three times a day    Azor 10-40 Mg Tabs (Amlodipine-olmesartan) .Marland Kitchen... Take 1 tablet by mouth once a day  BP today: 110/70 Prior BP: 140/80 (04/19/2009)  Labs Reviewed: K+: 4.8 (06/19/2007) Creat: : 1.02 (06/19/2007)   Chol: 155 (06/19/2007)   HDL: 38 (06/19/2007)   LDL: 88 (06/19/2007)   TG: 146 (06/19/2007)  Problem # 4:  OBESITY (ICD-278.00) Assessment: Improved  Ht: 65 (02/20/2010)   Wt: 305.75 (02/20/2010)   BMI: 51.06 (02/20/2010)  Complete Medication List: 1)  Lasix 40 Mg Tabs (Furosemide) .... One tab by mouth two times a day 2)  Clonidine Hcl 0.3 Mg Tabs (Clonidine hcl) .... Two tabs by mouth three times a day 3)  Klor-con M20 20 Meq Tbcr (Potassium chloride crys cr) .... Two tabs by mouth once daily 4)  Azor 10-40 Mg Tabs (Amlodipine-olmesartan) .... Take 1 tablet by mouth once a day 5)  Zolpidem Tartrate 5 Mg Tabs (Zolpidem tartrate) .Marland Kitchen.. 1 hs as needed insomna 6)  Meclizine Hcl 25 Mg Tabs (Meclizine hcl) .... 1/2 - 1 tab q 8 hrs as needed dizziness 7)  Glipizide 5 Mg Tabs (Glipizide) .... One and a half tablets twice daily 8)  Tussionex Pennkinetic Er 10-8 Mg/47ml Lqcr (Hydrocod polst-chlorphen polst) .... One teasponn twice daily as needed for cough  Patient Instructions: 1)  f/u as  before. 2)  Med sent in for cough. 3)  I will review CXR and labs from Ed visit on 12/23, if no evidence ofinfection no other meds will be sent in. 4)  We will get GI appt for you and call today with appt. 5)  pls call if I can be of help. Prescriptions: Sandria Senter ER 10-8 MG/5ML LQCR (HYDROCOD POLST-CHLORPHEN POLST) one teasponn twice daily as needed for cough  #450 cc x 0   Entered and Authorized by:   Syliva Overman MD   Signed by:   Syliva Overman MD on 02/20/2010   Method used:   Printed then faxed to ...       [facility]       [address]       [place]  [place], [place]  [address]       Ph: [phone]       Fax: [phone]   RxID:   [id number]    Orders Added: 1)  Est. Patient Level IV [84132] 2)  Gastroenterology Referral [GI]

## 2010-03-29 NOTE — Progress Notes (Signed)
Summary: cough medicine  Phone Note Call from Patient   Summary of Call: her phar. said they never recieved her rx for cough medicine please send in asap Initial call taken by: Lind Guest,  February 20, 2010 4:21 PM  Follow-up for Phone Call        Rx Called In Follow-up by: Adella Hare LPN,  February 20, 2010 4:22 PM

## 2010-03-29 NOTE — Letter (Signed)
Summary: surgical supplies  surgical supplies   Imported By: Lind Guest 02/27/2010 15:17:08  _____________________________________________________________________  External Attachment:    Type:   Image     Comment:   External Document

## 2010-03-30 NOTE — Letter (Signed)
Summary: Letter  Letter   Imported By: Lind Guest 03/27/2009 11:28:41  _____________________________________________________________________  External Attachment:    Type:   Image     Comment:   External Document

## 2010-04-18 NOTE — Letter (Signed)
Summary: general surgery  general surgery   Imported By: Lind Guest 04/12/2010 10:28:52  _____________________________________________________________________  External Attachment:    Type:   Image     Comment:   External Document

## 2010-04-23 ENCOUNTER — Encounter: Payer: Self-pay | Admitting: Family Medicine

## 2010-05-07 LAB — DIFFERENTIAL
Eosinophils Relative: 3 % (ref 0–5)
Lymphocytes Relative: 22 % (ref 12–46)
Lymphs Abs: 1.8 10*3/uL (ref 0.7–4.0)
Monocytes Absolute: 0.6 10*3/uL (ref 0.1–1.0)
Neutro Abs: 5.5 10*3/uL (ref 1.7–7.7)

## 2010-05-07 LAB — CBC
HCT: 27.3 % — ABNORMAL LOW (ref 36.0–46.0)
MCV: 78.9 fL (ref 78.0–100.0)
Platelets: 380 10*3/uL (ref 150–400)
RBC: 3.46 MIL/uL — ABNORMAL LOW (ref 3.87–5.11)
WBC: 8.2 10*3/uL (ref 4.0–10.5)

## 2010-05-13 LAB — CBC
HCT: 35.5 % — ABNORMAL LOW (ref 36.0–46.0)
Platelets: 338 10*3/uL (ref 150–400)
WBC: 8.5 10*3/uL (ref 4.0–10.5)

## 2010-05-13 LAB — URINALYSIS, ROUTINE W REFLEX MICROSCOPIC
Bilirubin Urine: NEGATIVE
Glucose, UA: NEGATIVE mg/dL
Hgb urine dipstick: NEGATIVE
Ketones, ur: 15 mg/dL — AB
Protein, ur: 100 mg/dL — AB

## 2010-05-13 LAB — BASIC METABOLIC PANEL
BUN: 12 mg/dL (ref 6–23)
GFR calc non Af Amer: >60 mL/min (ref >60)
Potassium: 3.9 mEq/L (ref 3.5–5.1)

## 2010-05-13 LAB — URINE MICROSCOPIC-ADD ON

## 2010-05-13 LAB — DIFFERENTIAL
Lymphocytes Relative: 10 % — ABNORMAL LOW (ref 12–46)
Lymphs Abs: 0.8 10*3/uL (ref 0.7–4.0)
Neutrophils Relative %: 86 % — ABNORMAL HIGH (ref 43–77)

## 2010-05-14 ENCOUNTER — Encounter: Payer: Self-pay | Admitting: Family Medicine

## 2010-05-15 ENCOUNTER — Encounter: Payer: Self-pay | Admitting: Family Medicine

## 2010-05-15 ENCOUNTER — Telehealth: Payer: Self-pay | Admitting: Family Medicine

## 2010-05-15 NOTE — Telephone Encounter (Signed)
Advise sudafed one to 2 tabs daily, her bp is controlled so ok, has upcoming appt remind her in am

## 2010-05-16 ENCOUNTER — Encounter: Payer: Self-pay | Admitting: Family Medicine

## 2010-05-16 ENCOUNTER — Ambulatory Visit (INDEPENDENT_AMBULATORY_CARE_PROVIDER_SITE_OTHER): Payer: BC Managed Care – PPO | Admitting: Family Medicine

## 2010-05-16 VITALS — BP 150/88 | HR 90 | Resp 16 | Ht 65.0 in | Wt 290.0 lb

## 2010-05-16 DIAGNOSIS — J209 Acute bronchitis, unspecified: Secondary | ICD-10-CM

## 2010-05-16 DIAGNOSIS — I10 Essential (primary) hypertension: Secondary | ICD-10-CM

## 2010-05-16 DIAGNOSIS — J019 Acute sinusitis, unspecified: Secondary | ICD-10-CM

## 2010-05-16 DIAGNOSIS — Z13220 Encounter for screening for lipoid disorders: Secondary | ICD-10-CM

## 2010-05-16 DIAGNOSIS — R110 Nausea: Secondary | ICD-10-CM

## 2010-05-16 DIAGNOSIS — E119 Type 2 diabetes mellitus without complications: Secondary | ICD-10-CM

## 2010-05-16 MED ORDER — ONDANSETRON HCL 2 MG/ML IJ SOLN
4.0000 mg | INTRAMUSCULAR | Status: DC
  Administered 2010-05-17: 4 mg via INTRAMUSCULAR

## 2010-05-16 MED ORDER — ONDANSETRON HCL 2 MG/ML IJ SOLN: 4.0000 mg | INTRAMUSCULAR | Status: DC

## 2010-05-16 MED ORDER — CHLORPHENIRAMINE-HYDROCODONE 8-10 MG/5ML PO LQCR: 5.0000 mL | Freq: Two times a day (BID) | ORAL | Status: AC | PRN

## 2010-05-16 MED ORDER — CEFTRIAXONE SODIUM 500 MG IJ SOLR
500.0000 mg | Freq: Once | INTRAMUSCULAR | Status: AC
  Administered 2010-05-16: 500 mg via INTRAMUSCULAR

## 2010-05-16 MED ORDER — METHYLPREDNISOLONE ACETATE 80 MG/ML IJ SUSP
80.0000 mg | Freq: Once | INTRAMUSCULAR | Status: AC
  Administered 2010-05-16: 80 mg via INTRAMUSCULAR

## 2010-05-16 MED ORDER — PENICILLIN V POTASSIUM 125 MG/5ML PO SOLR: 250.0000 mg | Freq: Four times a day (QID) | ORAL | Status: DC

## 2010-05-16 MED ORDER — FLUCONAZOLE 100 MG PO TABS: ORAL_TABLET | ORAL | Status: DC

## 2010-05-16 NOTE — Telephone Encounter (Signed)
Patient aware to take sudafed for her symptoms and she is coming in today for appt

## 2010-05-16 NOTE — Patient Instructions (Signed)
F/U in 4 months..  You are being treated for sinusitis and bronchitis. You will also get injection in the office for nausea. Meds are sent to your pharmacy.   Fasting lipid, chem7 with Egfr, HBA1C, TSH asap  HBA1C in 4 months

## 2010-05-17 ENCOUNTER — Telehealth: Payer: Self-pay | Admitting: Family Medicine

## 2010-05-17 MED ORDER — PENICILLIN V POTASSIUM 125 MG/5ML PO SOLR: 250.0000 mg | Freq: Four times a day (QID) | ORAL | Status: AC

## 2010-05-17 MED ORDER — HYDROCOD POLST-CHLORPHEN POLST 10-8 MG/5ML PO LQCR: 5.0000 mL | Freq: Two times a day (BID) | ORAL | Status: DC | PRN

## 2010-05-17 MED ORDER — FLUCONAZOLE 100 MG PO TABS: ORAL_TABLET | ORAL | Status: AC

## 2010-05-17 NOTE — Telephone Encounter (Signed)
Antibiotic sent this morning and the two additional meds (fluconazole and tussinex) resent this evening

## 2010-05-18 ENCOUNTER — Encounter: Payer: Self-pay | Admitting: Family Medicine

## 2010-05-18 NOTE — Progress Notes (Signed)
  Subjective:    Patient ID: Christina Davenport, female    DOB: 12/27/1970, 40 y.o.   MRN: [id number]  HPI Patient reports she has not been well in the past 2 weeks. Prior to this she has been recovering well from her recent brain surgery, and had her PEG tube removed in the past 2 weeks Reports recent chills, but has had no documented fever.  Denies chest pains, palpitations, paroxysmal nocturnal dyspnea, orthopnea and leg swelling Denies abdominal pain,vomiting,diarrhea or constipation.  Denies rectal bleeding or change in bowel movement. Denies dysuria, frequency, hesitancy or incontinence. Denies joint pain, swelling and limitation and mobility. Denies seizure, numbness, or tingling. Denies depression, anxiety or insomnia. Denies skin break down or rash. Pt has been testing regularly, and reports fasting sugars to be less than 120. She denies hypoglycemic episodes, or symptoms of hyperglycemia.      Review of Systems  Constitutional: Positive for chills and fatigue.  HENT: Positive for congestion, postnasal drip and sinus pressure.        Yellow ,green nasal drainage  Respiratory: Positive for cough and chest tightness. Negative for choking, wheezing and stridor.        Yellow foul sputum  Gastrointestinal: Positive for nausea. Negative for vomiting and diarrhea.  Neurological: Positive for headaches.       Maxillary pressure        Objective:   Physical Exam Patient alert and oriented and in no Cardiopulmonary distress. Ill appearing, adequately hydrated.  HEENT: No facial asymmetry, EOMI, maxillary sinus tenderness, TM's clear, Oropharynx pink and moist.  Neck supple no adenopathy.  Chest::decreased air entry, bilateral crackles, no wheezes CVS: S1, S2 no murmurs, no S3.  ABD: Soft non tender. Bowel sounds normal.  Ext: No edema  MS: Adequate ROM spine, shoulders, hips and knees.  Skin: Intact, no ulcerations or rash noted.  Psych: Good eye contact, normal  affect. Memory intact not anxious or depressed appearing.  CNS: CN 2-12 intact, power, tone and sensation normal throughout.        Assessment & Plan:  1. Acute sinusitis and bronchitis, antibiotics prescribed, penicillin prescribed, pt educated re impt of completing entire antibiotic course and ensuring adequate rest and fluid intake.  2. Hypertension, controlled, no med changes. 3. Diabetes: improved per pt . History and based on dietary change and weight loss following recent surgery.Labs are due at this time.  4.Obesity: improved, healthy lifestyle encouraged  5.Nausea, zofran administered, and BRAT diet discussed

## 2010-05-23 ENCOUNTER — Telehealth: Payer: Self-pay | Admitting: Family Medicine

## 2010-05-23 NOTE — Telephone Encounter (Signed)
See response and let pt know pls

## 2010-05-23 NOTE — Telephone Encounter (Signed)
Patient called in today states she had an MRI done at [facility] yesterday and it showed what appeared to be a cyst on her ovary. She wanted to know if Dr. Lodema Hong did pap smears and ultrasounds, I told her we didn't do ultrasounds.  She then stated she had called Dr. Rayna Sexton office to see if they would give her a pap smear, but they are booked for the next 2 weeks.  She wanted to know if Dr. Lodema Hong would do a pap smear to confirm it was a cyst.  I told her I would have to put in a note.  So please advise.

## 2010-05-23 NOTE — Telephone Encounter (Signed)
Does patient need a pap smear to determine if she has a cyst?

## 2010-05-23 NOTE — Telephone Encounter (Signed)
A pelvic US and not a papsmear would detect a cyst, she still needs her pap, Dr Emelda Fear can do the pelvic US at that time , 2 weeks should be fine

## 2010-05-24 NOTE — Telephone Encounter (Signed)
Called patient and left message.

## 2010-05-25 NOTE — Telephone Encounter (Signed)
Called patient left message

## 2010-05-28 NOTE — Telephone Encounter (Signed)
Patient aware.

## 2010-07-06 NOTE — Telephone Encounter (Signed)
Patient aware.

## 2010-07-13 NOTE — H&P (Signed)
[place]. [facility]  Patient:    Christina Davenport, Christina Davenport                      MRN: [id number] Adm. Date:  07/24/00 Attending:  Cristi Loron, M.D.                         History and Physical  CHIEF COMPLAINT:  Left hearing loss.  HISTORY OF PRESENT ILLNESS:  The patient is a 40 year old black female who was in her usual state of good health until one day she was curling her hair, she became dizzy and noticed some ringing in her right ear.  She subsequently was found to have some hearing loss and was referred to Dr. Lucky Cowboy.  She was treated with medications, failed to improve and she was worked up with a brain MRI and it demonstrated a Chiari I malformation; patient was kindly sent for my consultation.  The patient complains of diminished hearing in the left ear.  She has had some dizziness.  Her mother has a Chiari I malformation and was operated on by Dr. Reinaldo Meeker.  PAST MEDICAL HISTORY:  Positive for hypertension, diabetes mellitus, obesity.  PAST SURGICAL HISTORY:  None.  MEDICATIONS PRIOR TO ADMISSION: 1. Glucotrol 5 mg p.o. q.d. 2. Catapres 0.62 mg p.o. q.d. 3. Lasix 80 mg p.o. q.d. 4. K-Dur 40 mg p.o. q.d.  DRUG ALLERGIES:  VASOTEC causes "whelps."  FAMILY MEDICAL HISTORY:  The patients mother is age 31 and has a Chiari I malformation.  Patients father is age 103 and suffers from hypertension.  SOCIAL HISTORY:  The patient is single.  She has no children.  She lives in [place].  She denies tobacco, ethanol and drug use.  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  REVIEW OF SYSTEMS:  Negative except as above.  PHYSICAL EXAMINATION:  GENERAL:  A pleasant, morbidly obese 40 year old black female complaining of dizziness and left hearing loss.  Height 5 feet 5 inches.  Weight 330 pounds.  HEENT:  Normocephalic, atraumatic.  Pupils -- OS 2 mm, OD 3 mm, reactive OU. Extraocular movements are intact.  Sclerae  white.  Conjunctivae are pink. Oropharynx benign.  NECK:  Supple, obese.  There are no masses, deformities, tracheal deviation, jugular venous distention or carotid bruits.  She has a normal cervical range of motion.  Spurlings test is negative.  Lhermittes sign was not present.  THORAX:  Symmetric.  LUNGS:  Clear to auscultation.  HEART:  Regular rate and rhythm.  ABDOMEN:  Obese, soft, nontender.  EXTREMITIES:  Obese, nontender.  BACK:  Benign.  NEUROLOGIC:  The patient is alert and oriented x 3.  Cranial nerves II-XII are grossly intact bilaterally except for the above-noted anisocoria.  Motor strength is 5/5 in her bilateral deltoids, biceps, triceps, hand grips, wrist extensors, interossei, psoas, quadriceps, gastrocnemii, extensor hallucis longi.  Sensory exam is normal to light touch in all tested dermatomes bilaterally.  Deep tendon reflexes are 2/4 in her bilateral biceps, triceps, brachioradialis; 2/4 in her bilateral quadriceps and gastrocnemii.  She has bilateral flexor plantar reflexes and two-beat nonsustained ankle clonus on the left; no ankle clonus on the right.  DIAGNOSTIC STUDIES:  The patient had a brain MRI performed with and without contrast at [facility] on June 07, 2000.  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  Cerebellopontine angle appears  normal.  ASSESSMENT:  Chiari I malformation with syringomyelia.  I discussed the situation with the patient and reviewed the MRI scan with her and her mother. 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  I told her that I cannot be sure that this has anything to do with her hearing loss and dizziness, but nonetheless, this Chiari I malformation needs to be addressed. I discussed the various treatment options with her including doing nothing, continued medical management and surgery; I have recommended the latter.  I have described a suboccipital  craniectomy with C1 and C2 laminectomies and duraplasty.  I have shown them surgical models and I have discussed the risks of surgery extensively.  The patient has weighed the risks, benefits and alternatives to surgery and decided to proceed with a Chiari decompression on Jul 24, 2000.  History of hypertension, diabetes mellitus, morbid obesity, etc, noted. DD:  08/07/00 TD:  08/08/00 Job: [id number]

## 2010-07-13 NOTE — Op Note (Signed)
[place]. [facility]  Patient:    Christina Davenport, Christina Davenport                      MRN: [id number] Proc. Date: 07/24/00 Adm. Date:  [id number] Disc. Date: [id number] Attending:  Tressie Stalker D                           Operative Report  PREOPERATIVE DIAGNOSES:  Chiari I malformation, syringomyelia.  POSTOPERATIVE DIAGNOSES:  Chiari I malformation, syringomyelia.  PROCEDURES:  Suboccipital craniectomy, C1 and C2 laminectomy, duraplasty.  SURGEON:  Cristi Loron, M.D.  ASSISTANT:  Tanya Nones. Jeral Fruit, M.D.  ANESTHESIA:  General endotracheal.  ESTIMATED BLOOD LOSS:  250 cc.  SPECIMENS:  None.  DRAINS:  None.  COMPLICATIONS:  None.  BRIEF HISTORY:  The patient is a 40 year old female who suffered from headaches, dizziness, etc.  She failed medical management and was worked up with a brain MRI, which demonstrated a Chiari I malformation with syringomyelia.  I discussed the various treatment options with her and recommended that she undergo a Chiari decompression.  The patient weighed the risks, benefits, and alternatives of surgery and decided to proceed with the operation.  DESCRIPTION OF PROCEDURE:  The patient was brought to the operating room by the anesthesia team.  General endotracheal anesthesia was induced.  I then applied the Mayfield three-point head rest to the patients calvarium, and she was turned to the prone position on chest rolls.  He suboccipital and posterior cervical region was then shaved and prepared with Betadine scrub and Betadine solution.  Sterile drapes were applied.  Then I injected the area to be incised with Marcaine with epinephrine solution and used the scalpel to make a vertical incision in the suboccipital and upper cervical region in the midline.  I used electrocautery to dissect down through the abundant adipose tissue down through the cervical fascia.  I then divided the fascia bilaterally in the midline in a  relatively avascular midline plane.  I used electrocautery to dissect the periosteum from the suboccipital region as well as to perform a subperiosteal dissection, separating the paraspinous musculature from the C1, C2, and C3 spinous processes of the lamina.  I provided exposure with cerebellar retractors.  I then used the high-speed drill to thin out the suboccipital region, i.e., drilling out until there was only a thin shell of bone over the underlying dura.  I also thinned out the posterior C1 lamina as well as the C2 lamina with the drill.  I then used the Kerrison punch to complete the suboccipital craniectomy.  There was the typical fibrotic band at the foramen magnum causing significant narrowing.  I removed the remainder of the posterior C1 ring and the upper aspect of the C2 lamina.  I used the 15 blade scalpel and the Kerrison punch to remove the fibrotic band at the foramen magnum.  I then incised the dura in a Y-shaped fashion, and this exposed the underlying arachnoid.  I divided the arachnoid and inspected the cerebellar tonsils, which had obviously herniated down into the cervical region.  There was no significant fibrosis.  I did not manipulate the tonsils.  It looked like there was good flow of CSF.  I then obtained a 4 x 4 Dura-Guard dural substitute and then performed a duraplasty using a combination of interrupted and running 4-0 Nurolon suture.  It gave a fairly good dural closure.  I then laid  a large piece of Gelfoam over the exposed dura.  I then achieved stringent hemostasis using bipolar electrocautery.  I then removed the cerebellar retractors and then reapproximated the patients cervical fascia using interrupted #1 Vicryl suture, the subcutaneous tissue with interrupted 3-0 Vicryl suture, and the skin with a running 3-0 nylon suture.  I then coated the incision with bacitracin ointment and then applied a sterile dressing.  I removed the drapes, and then the  patient was subsequently returned to the supine position.  The Mayfield three-point head rest was removed from the calvarium, and she was subsequently extubated by the anesthesia team. DD:  07/27/00 TD:  07/28/00 Job: [id number]

## 2010-07-13 NOTE — Discharge Summary (Signed)
[place]. [facility]  Patient:    Christina Davenport, Christina Davenport Visit Number: [id number] MRN: [id number]          Service Type: SUR Location: [id number] Attending Physician:  Cristi Loron Dictated by:   Cristi Loron, M.D. Admit Date:  07/24/2000 Discharge Date: 07/27/2000                             Discharge Summary  For full details of this admission, please refer to typed history and physical.  BRIEF HISTORY:  The patient is a 40 year old black female who suffered from headaches, dizziness, etc.  She failed medical management and was worked up with a brain MRI which demonstrated a Chiari I malformation with syringomyelia.  I discussed the various treatments with her and recommended she undergo a Chiari decompression.  The patient weighed the risks, benefits, and alternatives of surgery and decided to proceed with surgery.  For past medical history, past surgical history, medications prior to admission, drug allergies, family medical history, social history, admission physical examination, imaging status, assessment and plan, etc., please refer to typed history and physical.  HOSPITAL COURSE:  I performed a suboccipital craniectomy and C1 and C2 laminectomy with duraplasty on the patient on Jul 24, 2000. The surgery went well without complications (for full details of this operation, please refer to typed operative note).  POSTOPERATIVE COURSE:  The patients postoperative course was essentially unremarkable.  She was initially monitored in the neurosurgical intensive care unit. She remained stable neurologically and vital signs.  The patient was subsequently transferred to the neurosurgical general floor where she was observed.  By July 27, 2000, the patient was afebrile, vital signs were stable, she was eating well and ambulating well, her wound was healing well without signs of infection, and she requested discharge to home.  She was  therefore discharged home on July 27, 2000.  DISCHARGE INSTRUCTIONS:  The patient was instructed to resume her outpatient medical regimen, instructed to follow up with me in a week.  DISCHARGE MEDICATIONS:  Percocet and Valium p.r.n.  FINAL DIAGNOSIS:  Chiari I malformation syringomyelia.  PROCEDURE PERFORMED:  Suboccipital craniectomy C1 and C2 laminectomy with Duraplasty.Dictated by:   Cristi Loron, M.D. Attending Physician:  Tressie Stalker D DD:  10/30/00 TD:  10/30/00 Job: [id number]

## 2010-07-29 ENCOUNTER — Other Ambulatory Visit: Payer: Self-pay | Admitting: Family Medicine

## 2010-08-14 NOTE — Telephone Encounter (Signed)
Pt was seen 05/16/2010

## 2010-09-19 ENCOUNTER — Encounter: Payer: Self-pay | Admitting: Family Medicine

## 2010-09-19 ENCOUNTER — Ambulatory Visit: Payer: BC Managed Care – PPO | Admitting: Family Medicine

## 2010-10-10 ENCOUNTER — Other Ambulatory Visit: Payer: Self-pay | Admitting: Obstetrics and Gynecology

## 2010-10-10 ENCOUNTER — Other Ambulatory Visit (HOSPITAL_COMMUNITY)
Admission: RE | Admit: 2010-10-10 | Discharge: 2010-10-10 | Disposition: A | Discharge status: Home / Self Care | Payer: BC Managed Care – PPO | Source: Ambulatory Visit | Attending: Obstetrics and Gynecology | Admitting: Obstetrics and Gynecology

## 2010-10-10 DIAGNOSIS — Z01419 Encounter for gynecological examination (general) (routine) without abnormal findings: Secondary | ICD-10-CM | POA: Insufficient documentation

## 2010-10-25 ENCOUNTER — Other Ambulatory Visit: Payer: Self-pay | Admitting: Family Medicine

## 2010-10-26 LAB — COMPLETE METABOLIC PANEL WITH GFR
CO2: 24 mEq/L (ref 19–32)
Creat: 1.15 mg/dL — ABNORMAL HIGH (ref 0.50–1.10)
GFR, Est African American: >60 mL/min (ref >60)
GFR, Est Non African American: 53 mL/min — ABNORMAL LOW (ref >60)
Glucose, Bld: 166 mg/dL — ABNORMAL HIGH (ref 70–99)
Total Bilirubin: 0.4 mg/dL (ref 0.3–1.2)

## 2010-10-26 LAB — HEMOGLOBIN A1C
Hgb A1c MFr Bld: 7.2 % — ABNORMAL HIGH (ref <5.7)
Mean Plasma Glucose: 160 mg/dL — ABNORMAL HIGH (ref <117)

## 2010-10-26 LAB — LIPID PANEL
Cholesterol: 199 mg/dL (ref 0–200)
HDL: 49 mg/dL (ref >39)
Triglycerides: 134 mg/dL (ref <150)

## 2010-10-30 ENCOUNTER — Encounter: Payer: Self-pay | Admitting: Family Medicine

## 2010-10-31 ENCOUNTER — Ambulatory Visit (INDEPENDENT_AMBULATORY_CARE_PROVIDER_SITE_OTHER): Payer: BC Managed Care – PPO | Admitting: Family Medicine

## 2010-10-31 ENCOUNTER — Encounter: Payer: Self-pay | Admitting: Family Medicine

## 2010-10-31 VITALS — BP 140/80 | HR 98 | Resp 16 | Ht 65.0 in | Wt 274.0 lb

## 2010-10-31 DIAGNOSIS — E119 Type 2 diabetes mellitus without complications: Secondary | ICD-10-CM

## 2010-10-31 DIAGNOSIS — E785 Hyperlipidemia, unspecified: Secondary | ICD-10-CM

## 2010-10-31 DIAGNOSIS — I10 Essential (primary) hypertension: Secondary | ICD-10-CM

## 2010-10-31 DIAGNOSIS — E669 Obesity, unspecified: Secondary | ICD-10-CM

## 2010-10-31 DIAGNOSIS — E1169 Type 2 diabetes mellitus with other specified complication: Secondary | ICD-10-CM

## 2010-10-31 LAB — CBC
HCT: 40.9 % (ref 36.0–46.0)
MCH: 24.7 pg — ABNORMAL LOW (ref 26.0–34.0)
MCV: 91.1 fL (ref 78.0–100.0)
Platelets: 374 10*3/uL (ref 150–400)
RBC: 4.49 MIL/uL (ref 3.87–5.11)

## 2010-10-31 NOTE — Assessment & Plan Note (Signed)
Deteriorated, pt needs to take the glipizide one daily as prescribed

## 2010-10-31 NOTE — Assessment & Plan Note (Signed)
Uncontrolled. Medication compliance addressed. Commitment to regular exercise, and healthy  eating habits with portion control discussed. DASH diet, and low fat diet discussed, and literature offered. No changes in medication at this time.  

## 2010-10-31 NOTE — Assessment & Plan Note (Signed)
Electing dietary change only at this toime, she is aware of the statin recommendation for diabetics, but declines at this time

## 2010-10-31 NOTE — Assessment & Plan Note (Signed)
Improved. Pt applauded on succesful weight loss through lifestyle change, and encouraged to continue same. Weight loss goal set for the next several months.  

## 2010-10-31 NOTE — Patient Instructions (Signed)
F/u in early January  A healthy diet is rich in fruit, vegetables and whole grains. Poultry fish, nuts and beans are a healthy choice for protein rather then red meat. A low sodium diet and drinking 64 ounces of water daily is generally recommended. Oils and sweet should be limited. Carbohydrates especially for those who are diabetic or overweight, should be limited to 30-45 gram per meal. It is important to eat on a regular schedule, at least 3 times daily. Snacks should be primarily fruits, vegetables or nuts.   It is important that you exercise regularly at least 30 minutes 5 times a week. If you develop chest pain, have severe difficulty breathing, or feel very tired, stop exercising immediately and seek medical attention   Fasting lipid , chem 7, HBA1C  Take ONE glipizide5 mg once daily, your blood sugars are too high  Weight loss goal of 1 to 2 pounds per week

## 2010-10-31 NOTE — Progress Notes (Signed)
  Subjective:    Patient ID: Christina Davenport, female    DOB: [birth year], 40 y.o.   MRN: [id number]  HPI The PT is here for follow up and re-evaluation of chronic medical conditions, medication management and review of any available recent lab and radiology data.  Preventive health is updated, specifically  Cancer screening and Immunization.   Questions or concerns regarding consultations or procedures which the PT has had in the interim are  addressed. The PT denies any adverse reactions to current medications since the last visit.  Pt has PCO , recently advised to have oophorectomy, and this is planned for next week. Little appetite with steady weight loss, however has not been taking glipizide as prescribed and blood sugar are ubncontrolled       Review of Systems Denies recent fever or chills. Denies sinus pressure, nasal congestion, ear pain or sore throat. Denies chest congestion, productive cough or wheezing. Denies chest pains, palpitations and leg swelling    Denies dysuria, frequency, hesitancy or incontinence. Denies joint pain, swelling and limitation in mobility. Denies seizures,reports improvement in  numbness, affecting the left side of her face. Denies depression, anxiety or insomnia. Denies skin break down or rash.        Objective:   Physical Exam Patient alert and oriented and in no cardiopulmonary distress.  HEENT: No facial asymmetry, EOMI, no sinus tenderness,  oropharynx pink and moist.  Neck supple no adenopathy.  Chest: Clear to auscultation bilaterally.  CVS: S1, S2 no murmurs, no S3.  ABD: Soft non tender. Bowel sounds normal.  Ext: No edema  MS: Adequate ROM spine, shoulders, hips and knees.  Skin: Intact, no ulcerations or rash noted.  Psych: Good eye contact, normal affect. Memory intact not anxious or depressed appearing.  CNS: CN 2-12 intact, power, tone and sensation normal throughout.        Assessment & Plan:

## 2010-11-01 ENCOUNTER — Other Ambulatory Visit: Payer: Self-pay | Admitting: Family Medicine

## 2010-11-01 MED ORDER — GLIPIZIDE ER 5 MG PO TB24: 5.0000 mg | ORAL_TABLET | Freq: Every day | ORAL | Status: DC

## 2010-11-01 MED ORDER — AMLODIPINE-OLMESARTAN 10-40 MG PO TABS: 1.0000 | ORAL_TABLET | Freq: Every day | ORAL | Status: DC

## 2010-11-01 NOTE — Progress Notes (Signed)
Addended by: Adella Hare B on: 11/01/2010 02:31 PM   Modules accepted: Orders, Medications

## 2010-11-07 ENCOUNTER — Other Ambulatory Visit: Payer: Self-pay | Admitting: Obstetrics and Gynecology

## 2010-11-07 ENCOUNTER — Ambulatory Visit: Payer: BC Managed Care – PPO

## 2010-11-07 DIAGNOSIS — N838 Other noninflammatory disorders of ovary, fallopian tube and broad ligament: Secondary | ICD-10-CM

## 2010-11-12 ENCOUNTER — Ambulatory Visit (HOSPITAL_COMMUNITY)
Admission: RE | Admit: 2010-11-12 | Discharge: 2010-11-12 | Disposition: A | Discharge status: Home / Self Care | Payer: BC Managed Care – PPO | Source: Ambulatory Visit | Attending: Obstetrics and Gynecology | Admitting: Obstetrics and Gynecology

## 2010-11-12 ENCOUNTER — Ambulatory Visit (HOSPITAL_COMMUNITY): Payer: BC Managed Care – PPO

## 2010-11-12 DIAGNOSIS — N839 Noninflammatory disorder of ovary, fallopian tube and broad ligament, unspecified: Secondary | ICD-10-CM | POA: Insufficient documentation

## 2010-11-12 DIAGNOSIS — N838 Other noninflammatory disorders of ovary, fallopian tube and broad ligament: Secondary | ICD-10-CM

## 2010-11-23 ENCOUNTER — Telehealth: Payer: Self-pay | Admitting: Family Medicine

## 2010-11-23 NOTE — Telephone Encounter (Signed)
Patient is aware 

## 2010-11-23 NOTE — Telephone Encounter (Signed)
Attempted  to directly contact pt, she was not avail;able , Dr Cherly Hensen is the doc I suggested pls let her know

## 2010-12-13 ENCOUNTER — Other Ambulatory Visit: Payer: Self-pay | Admitting: Family Medicine

## 2010-12-27 HISTORY — PX: BRAIN SURGERY: SHX531

## 2011-01-14 ENCOUNTER — Other Ambulatory Visit: Payer: Self-pay | Admitting: Family Medicine

## 2011-02-25 ENCOUNTER — Encounter: Payer: Self-pay | Admitting: Family Medicine

## 2011-03-06 ENCOUNTER — Encounter: Payer: Self-pay | Admitting: Family Medicine

## 2011-03-06 ENCOUNTER — Ambulatory Visit: Payer: BC Managed Care – PPO | Admitting: Family Medicine

## 2011-03-14 ENCOUNTER — Other Ambulatory Visit: Payer: Self-pay | Admitting: Family Medicine

## 2011-03-20 ENCOUNTER — Other Ambulatory Visit: Payer: Self-pay | Admitting: Family Medicine

## 2011-03-27 LAB — LIPID PANEL
HDL: 44 mg/dL (ref >39)
Triglycerides: 91 mg/dL (ref <150)

## 2011-03-27 LAB — BASIC METABOLIC PANEL
CO2: 23 mEq/L (ref 19–32)
Calcium: 9.4 mg/dL (ref 8.4–10.5)
Chloride: 103 mEq/L (ref 96–112)
Creat: 1.03 mg/dL (ref 0.50–1.10)
Glucose, Bld: 115 mg/dL — ABNORMAL HIGH (ref 70–99)
Sodium: 139 mEq/L (ref 135–145)

## 2011-04-02 DIAGNOSIS — D1809 Hemangioma of other sites: Secondary | ICD-10-CM

## 2011-04-02 DIAGNOSIS — G950 Syringomyelia and syringobulbia: Secondary | ICD-10-CM

## 2011-04-02 HISTORY — DX: Hemangioma of other sites: D18.09

## 2011-04-02 HISTORY — DX: Syringomyelia and syringobulbia: G95.0

## 2011-04-10 ENCOUNTER — Ambulatory Visit (INDEPENDENT_AMBULATORY_CARE_PROVIDER_SITE_OTHER): Payer: BC Managed Care – PPO | Admitting: Family Medicine

## 2011-04-10 ENCOUNTER — Encounter: Payer: Self-pay | Admitting: Family Medicine

## 2011-04-10 VITALS — BP 150/90 | HR 70 | Resp 16 | Ht 65.0 in | Wt 272.4 lb

## 2011-04-10 DIAGNOSIS — I10 Essential (primary) hypertension: Secondary | ICD-10-CM

## 2011-04-10 DIAGNOSIS — E119 Type 2 diabetes mellitus without complications: Secondary | ICD-10-CM

## 2011-04-10 DIAGNOSIS — E669 Obesity, unspecified: Secondary | ICD-10-CM

## 2011-04-10 DIAGNOSIS — E785 Hyperlipidemia, unspecified: Secondary | ICD-10-CM

## 2011-04-10 MED ORDER — GLIPIZIDE ER 2.5 MG PO TB24
2.5000 mg | ORAL_TABLET | Freq: Every day | ORAL | Status: DC
Start: 2011-04-10 — End: 2012-01-22

## 2011-04-10 NOTE — Patient Instructions (Addendum)
F/u in 4.5 month  Blood sugar excellent.  Reduced dose of glipizide to 2.5mg  one daily.  Pls take clonidine at set 8 hr intervals, 7am , 3pm  And 11pm  Plan on a 1500 cal diety, we will provide a sheet.  Log into "my fittnes pal" this counts calories  I am glad you feel better.  HBA1C, chem 7 and microalb in 4.5 months before next visit

## 2011-04-10 NOTE — Assessment & Plan Note (Signed)
Reduce glipiziode dose has sugar lows

## 2011-04-10 NOTE — Assessment & Plan Note (Signed)
Uncontrolled, take clonidine on schedule

## 2011-04-27 NOTE — Assessment & Plan Note (Signed)
Unchanged. Patient re-educated about  the importance of commitment to a  minimum of 150 minutes of exercise per week. The importance of healthy food choices with portion control discussed. Encouraged to start a food diary, count calories and to consider  joining a support group. Sample diet sheets offered. Goals set by the patient for the next several months.    

## 2011-04-27 NOTE — Progress Notes (Signed)
  Subjective:    Patient ID: Christina Davenport, female    DOB: 06-01-1970, 41 y.o.   MRN: [id number]  HPI The PT is here for follow up and re-evaluation of chronic medical conditions, medication management and review of any available recent lab and radiology data.  Preventive health is updated, specifically  Cancer screening and Immunization.   Questions or concerns regarding consultations or procedures which the PT has had in the interim are  addressed. The PT denies any adverse reactions to current medications since the last visit.  There are no new concerns.  There are no specific complaints       Review of Systems See HPI Denies recent fever or chills. Denies sinus pressure, nasal congestion, ear pain or sore throat. Denies chest congestion, productive cough or wheezing. Denies chest pains, palpitations and leg swelling Denies abdominal pain, nausea, vomiting,diarrhea or constipation.   Denies dysuria, frequency, hesitancy or incontinence. Denies joint pain, swelling and limitation in mobility. Denies headaches, seizures, numbness, or tingling. Denies depression, anxiety or insomnia. Denies skin break down or rash.        Objective:   Physical Exam Patient alert and oriented and in no cardiopulmonary distress.  HEENT: No facial asymmetry, EOMI, no sinus tenderness,  oropharynx pink and moist.  Neck supple no adenopathy.  Chest: Clear to auscultation bilaterally.  CVS: S1, S2 no murmurs, no S3.  ABD: Soft non tender. Bowel sounds normal.  Ext: No edema  MS: Adequate ROM spine, shoulders, hips and knees.  Skin: Intact, no ulcerations or rash noted.  Psych: Good eye contact, normal affect. Memory intact not anxious or depressed appearing.  CNS: CN 2-12 intact, power, tone and sensation normal throughout.        Assessment & Plan:

## 2011-06-14 ENCOUNTER — Other Ambulatory Visit: Payer: Self-pay | Admitting: Family Medicine

## 2011-06-24 ENCOUNTER — Other Ambulatory Visit: Payer: Self-pay | Admitting: Family Medicine

## 2011-06-27 ENCOUNTER — Telehealth: Payer: Self-pay | Admitting: Family Medicine

## 2011-06-27 ENCOUNTER — Other Ambulatory Visit: Payer: Self-pay

## 2011-06-27 MED ORDER — POTASSIUM CHLORIDE 20 MEQ/15ML (10%) PO SOLN: ORAL | Status: DC

## 2011-06-27 MED ORDER — FUROSEMIDE 40 MG PO TABS: ORAL_TABLET | ORAL | Status: DC

## 2011-06-27 NOTE — Telephone Encounter (Signed)
They have been sent.

## 2011-07-22 ENCOUNTER — Other Ambulatory Visit: Payer: Self-pay | Admitting: Family Medicine

## 2011-07-23 ENCOUNTER — Other Ambulatory Visit: Payer: Self-pay

## 2011-07-23 ENCOUNTER — Telehealth: Payer: Self-pay

## 2011-07-23 ENCOUNTER — Ambulatory Visit (INDEPENDENT_AMBULATORY_CARE_PROVIDER_SITE_OTHER): Payer: BC Managed Care – PPO | Admitting: Family Medicine

## 2011-07-23 MED ORDER — POTASSIUM CHLORIDE CRYS ER 20 MEQ PO TBCR: 20.0000 meq | EXTENDED_RELEASE_TABLET | Freq: Every day | ORAL | Status: DC

## 2011-07-23 NOTE — Telephone Encounter (Signed)
She needs to specifically bring in the form stating exactly what tests are being required by "the school" before any tests can be orderd.Also needs to make and keep appt end July latest, since has not been i since January.'Should have hBa1C , microalb , chem 7 before the visit, pls oder and let her know if not already ordred

## 2011-07-24 ENCOUNTER — Other Ambulatory Visit: Payer: Self-pay

## 2011-07-24 LAB — HM DIABETES EYE EXAM

## 2011-07-24 MED ORDER — POTASSIUM CHLORIDE CRYS ER 20 MEQ PO TBCR: EXTENDED_RELEASE_TABLET | ORAL | Status: DC

## 2011-07-25 ENCOUNTER — Telehealth: Payer: Self-pay | Admitting: Family Medicine

## 2011-07-25 NOTE — Telephone Encounter (Signed)
Spoke with pt and she stated that she will have paper sent or bring it by the office.

## 2011-07-25 NOTE — Telephone Encounter (Signed)
Called and spoke with pt today.

## 2011-07-30 ENCOUNTER — Telehealth: Payer: Self-pay | Admitting: Family Medicine

## 2011-07-30 DIAGNOSIS — Z020 Encounter for examination for admission to educational institution: Secondary | ICD-10-CM

## 2011-07-30 NOTE — Telephone Encounter (Signed)
Addended by: Kandis Fantasia B on: 07/30/2011 11:59 AM   Modules accepted: Orders

## 2011-07-30 NOTE — Telephone Encounter (Signed)
Ok to order urine drug screen per pt request for school purposes, varicella and MMR. From record she had tetanus only pls document on letterhead I will sign She has not had hep B series of immunization , her school may require this let her know she can get this here if she wishes

## 2011-07-30 NOTE — Telephone Encounter (Signed)
Addended by: Kandis Fantasia B on: 07/30/2011 05:03 PM   Modules accepted: Orders

## 2011-07-31 NOTE — Telephone Encounter (Signed)
Appropriate tests ordered.

## 2011-09-12 ENCOUNTER — Other Ambulatory Visit: Payer: Self-pay | Admitting: Family Medicine

## 2011-12-05 ENCOUNTER — Other Ambulatory Visit: Payer: Self-pay | Admitting: Family Medicine

## 2011-12-30 ENCOUNTER — Telehealth: Payer: Self-pay

## 2011-12-30 DIAGNOSIS — E119 Type 2 diabetes mellitus without complications: Secondary | ICD-10-CM

## 2011-12-30 DIAGNOSIS — Z139 Encounter for screening, unspecified: Secondary | ICD-10-CM

## 2011-12-30 LAB — BASIC METABOLIC PANEL
BUN: 14 mg/dL (ref 6–23)
CO2: 25 mEq/L (ref 19–32)
Chloride: 100 mEq/L (ref 96–112)
Glucose, Bld: 93 mg/dL (ref 70–99)
Potassium: 3.9 mEq/L (ref 3.5–5.3)
Sodium: 136 mEq/L (ref 135–145)

## 2011-12-30 NOTE — Telephone Encounter (Signed)
Labs ordered.

## 2011-12-30 NOTE — Telephone Encounter (Signed)
Labs had to be reordered and printed because they had been cancelled out in [facility]

## 2011-12-31 LAB — DRUG SCREEN, URINE
Amphetamine Screen, Ur: NEGATIVE
Barbiturate Quant, Ur: NEGATIVE
Benzodiazepines.: NEGATIVE
Cocaine Metabolites: NEGATIVE
Creatinine,U: 462.01 mg/dL
Phencyclidine (PCP): NEGATIVE

## 2011-12-31 LAB — MICROALBUMIN / CREATININE URINE RATIO
Creatinine, Urine: 425.9 mg/dL
Microalb Creat Ratio: 14.7 mg/g (ref 0.0–30.0)

## 2012-01-02 ENCOUNTER — Telehealth: Payer: Self-pay | Admitting: Family Medicine

## 2012-01-02 NOTE — Telephone Encounter (Signed)
Copy of labs put up front per request

## 2012-01-06 ENCOUNTER — Ambulatory Visit: Payer: BC Managed Care – PPO | Admitting: Family Medicine

## 2012-01-07 ENCOUNTER — Telehealth: Payer: Self-pay | Admitting: Family Medicine

## 2012-01-07 NOTE — Telephone Encounter (Signed)
Noted and will be addressed at Ov in am

## 2012-01-07 NOTE — Telephone Encounter (Signed)
Patient just wanted to verify that it can be documented that she has had chickenpox in the past.  She needs this for school. FYI

## 2012-01-08 ENCOUNTER — Encounter: Payer: Self-pay | Admitting: Family Medicine

## 2012-01-08 ENCOUNTER — Ambulatory Visit (INDEPENDENT_AMBULATORY_CARE_PROVIDER_SITE_OTHER): Payer: BC Managed Care – PPO | Admitting: Family Medicine

## 2012-01-08 ENCOUNTER — Other Ambulatory Visit: Payer: Self-pay | Admitting: Family Medicine

## 2012-01-08 VITALS — BP 150/90 | HR 100 | Resp 18 | Ht 65.0 in | Wt 289.0 lb

## 2012-01-08 DIAGNOSIS — N83209 Unspecified ovarian cyst, unspecified side: Secondary | ICD-10-CM

## 2012-01-08 DIAGNOSIS — R9389 Abnormal findings on diagnostic imaging of other specified body structures: Secondary | ICD-10-CM

## 2012-01-08 DIAGNOSIS — Z020 Encounter for examination for admission to educational institution: Secondary | ICD-10-CM

## 2012-01-08 DIAGNOSIS — E785 Hyperlipidemia, unspecified: Secondary | ICD-10-CM

## 2012-01-08 DIAGNOSIS — Z0289 Encounter for other administrative examinations: Secondary | ICD-10-CM

## 2012-01-08 DIAGNOSIS — E669 Obesity, unspecified: Secondary | ICD-10-CM

## 2012-01-08 DIAGNOSIS — I10 Essential (primary) hypertension: Secondary | ICD-10-CM

## 2012-01-08 DIAGNOSIS — E119 Type 2 diabetes mellitus without complications: Secondary | ICD-10-CM

## 2012-01-08 HISTORY — DX: Abnormal findings on diagnostic imaging of other specified body structures: R93.89

## 2012-01-08 HISTORY — DX: Unspecified ovarian cyst, unspecified side: N83.209

## 2012-01-08 NOTE — Progress Notes (Signed)
  Subjective:    Patient ID: Christina Davenport, female    DOB: 01-Sep-1970, 41 y.o.   MRN: [id number]  HPI The PT is here for follow up and re-evaluation of chronic medical conditions, medication management and review of any available recent lab and radiology data.  Preventive health is updated, specifically  Cancer screening and Immunization.  Need gyne exam for pap and abnormal pelvic US in the past. Flu vaccine today Questions or concerns regarding consultations or procedures which the PT has had in the interim are  addressed. The PT denies any adverse reactions to current medications since the last visit.  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 Denies sinus pressure, nasal congestion, ear pain or sore throat. Denies chest congestion, productive cough or wheezing. Denies chest pains, palpitations and leg swelling Denies abdominal pain, nausea, vomiting,diarrhea or constipation.   Denies dysuria, frequency, hesitancy or incontinence. Denies joint pain, swelling and limitation in mobility. Denies headaches, seizures, numbness, or tingling. Tearful, anxious and frustrated Denies skin break down or rash.        Objective:   Physical Exam Patient alert and oriented and in no cardiopulmonary distress.Tearful and anxious  HEENT:Facial asymmetr, EOMI, no sinus tenderness,  oropharynx pink and moist.  Neck supple no adenopathy.  Chest: Clear to auscultation bilaterally.  CVS: S1, S2 no murmurs, no S3.  ABD: Soft non tender. Bowel sounds normal.  Ext: No edema  MS: Adequate ROM spine, shoulders, hips and knees.  Skin: Intact, no ulcerations or rash noted.  Psych: Good eye contact, normal  affect. Memory intact   CNS: CN 2-12 intact, power, normal .in all extremities        Assessment & Plan:

## 2012-01-08 NOTE — Patient Instructions (Addendum)
F/U in 4.5 month  Flu vaccine today.  Varicella titer today.  Fasting lipid, chem 7, HBA1C, TSH and CBC in 4.5 month before next visit  It is important that you exercise regularly at least 30 minutes 5 times a week. If you develop chest pain, have severe difficulty breathing, or feel very tired, stop exercising immediately and seek medical attention   A healthy diet is rich in fruit, vegetables and whole grains. Poultry fish, nuts and beans are a healthy choice for protein rather then red meat. A low sodium diet and drinking 64 ounces of water daily is generally recommended. Oils and sweet should be limited. Carbohydrates especially for those who are diabetic or overweight, should be limited to 30-45 gram per meal. It is important to eat on a regular schedule, at least 3 times daily. Snacks should be primarily fruits, vegetables or nuts.  Work on 4 pound per month weight loss , this can be done, restrict calories to 1500 per day  You should be able to independently call for gyne appt at Dr Cherly Hensen  Blood pressure is elevated today, so please work on low salt diet rich in vegetables and fruit  All the best

## 2012-01-09 LAB — MEASLES/MUMPS/RUBELLA IMMUNITY
Mumps IgG: 1.52 {ISR} — ABNORMAL HIGH
Rubella: 8.1 IU/mL — ABNORMAL HIGH

## 2012-01-09 LAB — VARICELLA ZOSTER ANTIBODY, IGG: Varicella IgG: 4.15 {ISR} — ABNORMAL HIGH (ref <0.90)

## 2012-01-12 NOTE — Assessment & Plan Note (Signed)
Controlled, no change in medication Patient educated about the importance of limiting  Carbohydrate intake , the need to commit to daily physical activity for a minimum of 30 minutes , and to commit weight loss. The fact that changes in all these areas will reduce or eliminate all together the development of diabetes is stressed.    

## 2012-01-12 NOTE — Assessment & Plan Note (Signed)
Deteriorated. Patient re-educated about  the importance of commitment to a  minimum of 150 minutes of exercise per week. The importance of healthy food choices with portion control discussed. Encouraged to start a food diary, count calories and to consider  joining a support group. Sample diet sheets offered. Goals set by the patient for the next several months.    

## 2012-01-12 NOTE — Assessment & Plan Note (Signed)
Referred to gyne for further eval

## 2012-01-12 NOTE — Assessment & Plan Note (Signed)
Pt referred to gyne for further follow up and pelvic exam

## 2012-01-12 NOTE — Assessment & Plan Note (Signed)
Uncontrolled Hyperlipidemia:Low fat diet discussed and encouraged.  No med change 

## 2012-01-12 NOTE — Assessment & Plan Note (Signed)
Uncontrolled at this visit. Pt very emotionally distraught, and has also gained weight. No med change DASH diet and commitment to daily physical activity for a minimum of 30 minutes discussed and encouraged, as a part of hypertension management. The importance of attaining a healthy weight is also discussed.

## 2012-01-22 ENCOUNTER — Other Ambulatory Visit: Payer: Self-pay | Admitting: Family Medicine

## 2012-01-22 ENCOUNTER — Other Ambulatory Visit: Payer: Self-pay

## 2012-01-22 DIAGNOSIS — E119 Type 2 diabetes mellitus without complications: Secondary | ICD-10-CM

## 2012-01-22 MED ORDER — POTASSIUM CHLORIDE CRYS ER 20 MEQ PO TBCR: 20.0000 meq | EXTENDED_RELEASE_TABLET | Freq: Every day | ORAL | Status: DC

## 2012-01-22 MED ORDER — FUROSEMIDE 40 MG PO TABS: ORAL_TABLET | ORAL | Status: DC

## 2012-01-22 MED ORDER — CLONIDINE HCL 0.3 MG PO TABS: 0.3000 mg | ORAL_TABLET | Freq: Three times a day (TID) | ORAL | Status: DC

## 2012-01-22 MED ORDER — AMLODIPINE-OLMESARTAN 10-40 MG PO TABS: 1.0000 | ORAL_TABLET | Freq: Every day | ORAL | Status: DC

## 2012-01-22 MED ORDER — GLIPIZIDE ER 2.5 MG PO TB24: 2.5000 mg | ORAL_TABLET | Freq: Every day | ORAL | Status: DC

## 2012-04-08 ENCOUNTER — Telehealth: Payer: Self-pay

## 2012-04-08 ENCOUNTER — Other Ambulatory Visit: Payer: Self-pay | Admitting: Family Medicine

## 2012-04-08 MED ORDER — AMLODIPINE BESYLATE 10 MG PO TABS: 10.0000 mg | ORAL_TABLET | Freq: Every day | ORAL | Status: DC

## 2012-04-08 MED ORDER — LOSARTAN POTASSIUM 100 MG PO TABS: 100.0000 mg | ORAL_TABLET | Freq: Every day | ORAL | Status: DC

## 2012-04-08 NOTE — Telephone Encounter (Signed)
Pt aware.

## 2012-04-08 NOTE — Telephone Encounter (Signed)
Azor 10/40 needs PA.  Want to change to alternative?

## 2012-04-08 NOTE — Telephone Encounter (Signed)
I changed to amlodipine and cozar in place of azor, both are generic, they are printed, pls explain to pt and fax in

## 2012-05-25 IMAGING — US US PELVIS COMPLETE
1 series · 13 of 25 positions shown · non-contrast
Comparison: None

CLINICAL DATA: Ovarian masses, follow-up

TRANSABDOMINAL AND TRANSVAGINAL ULTRASOUND OF PELVIS
TECHNIQUE: Both transabdominal and transvaginal ultrasound
examinations of the pelvis were performed. Transabdominal technique
was performed for global imaging of the pelvis including uterus,
ovaries, adnexal regions, and pelvic cul-de-sac.

[Series 1: us pelvis complete · 0.23mm/px · 13 of 129 slices shown]
[im 1/129]
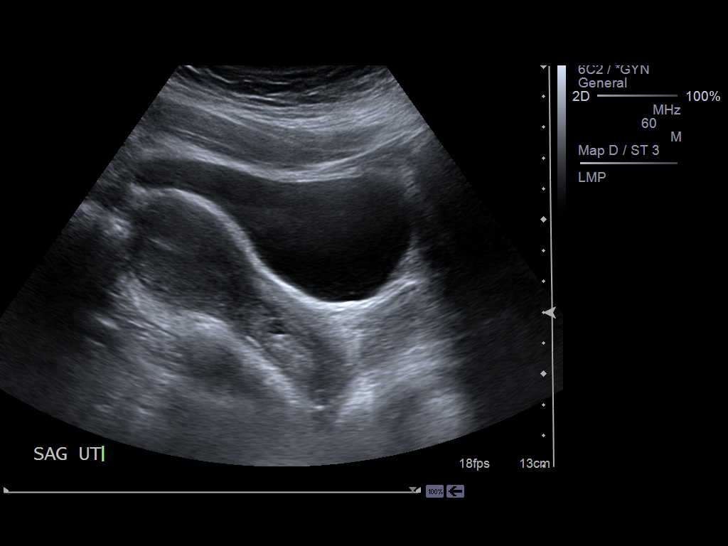
[im 11/129]
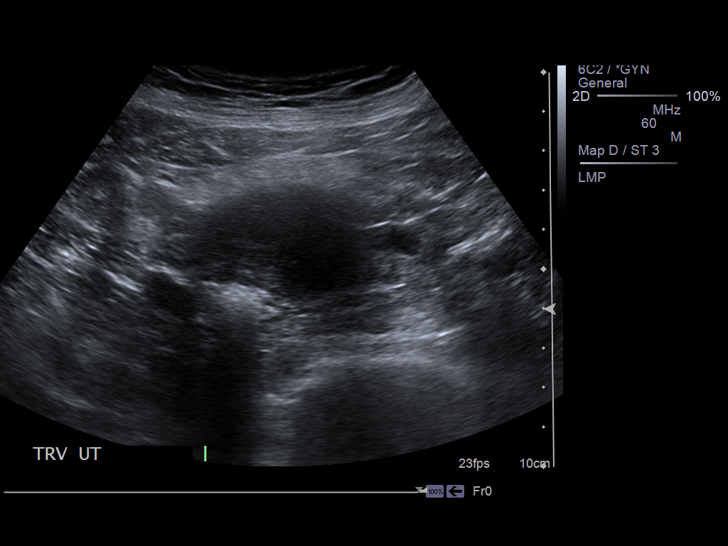
[im 22/129]
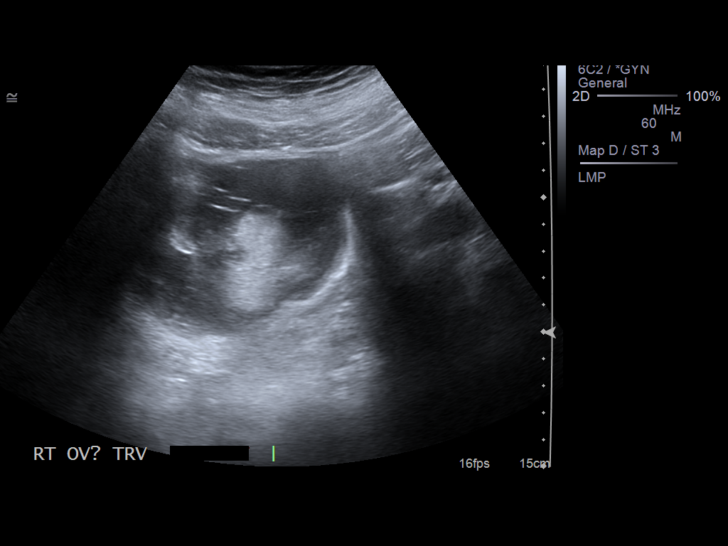
[im 33/129]
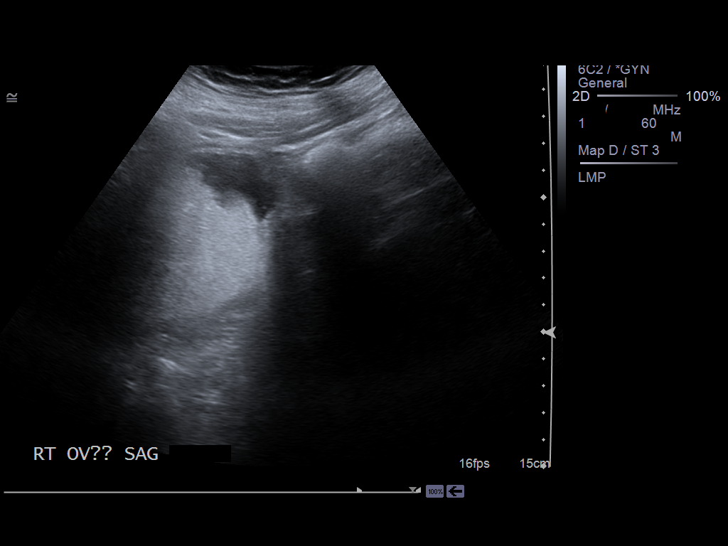
[im 43/129]
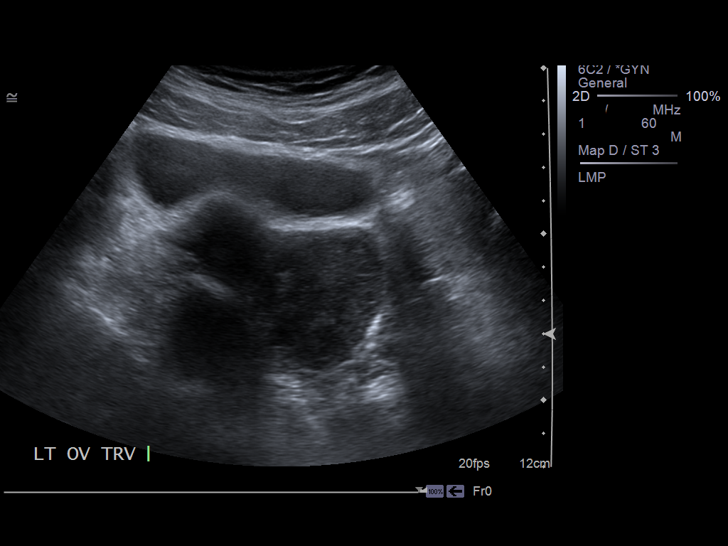
[im 54/129]
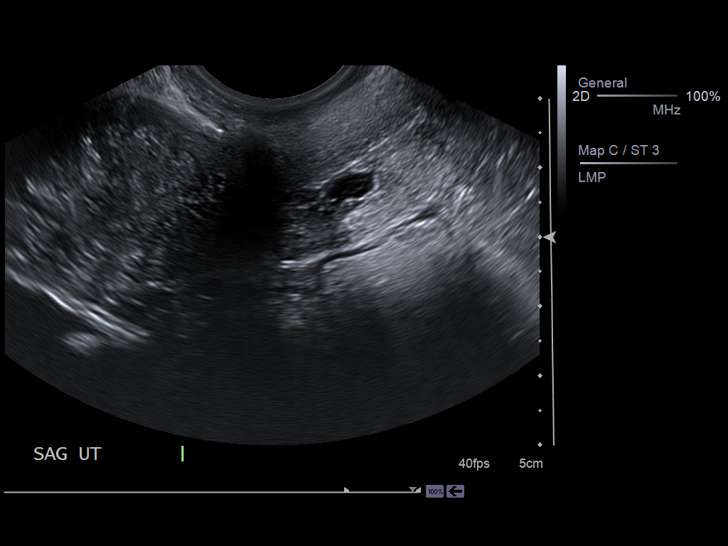
[im 65/129]
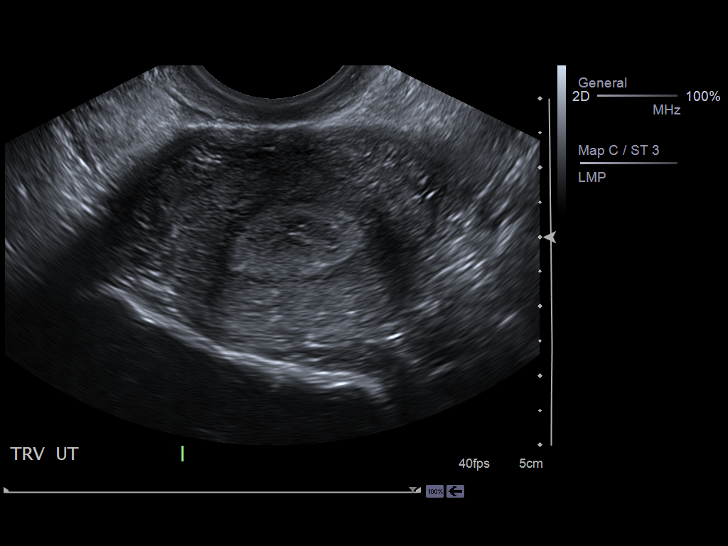
[im 75/129]
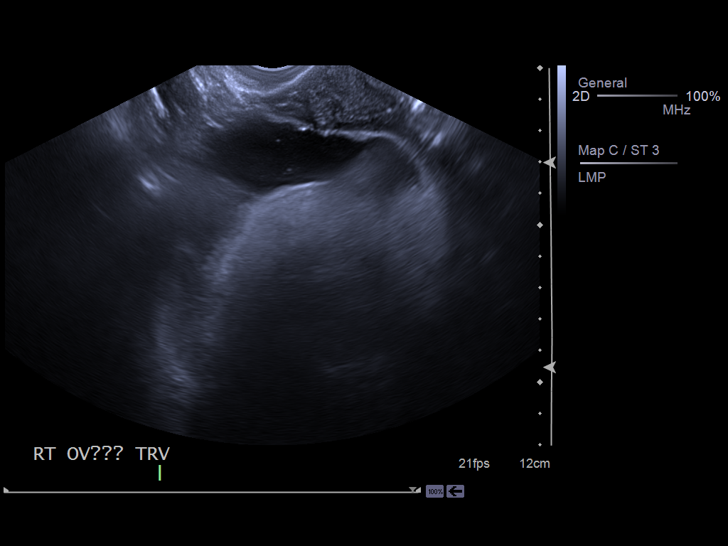
[im 86/129]
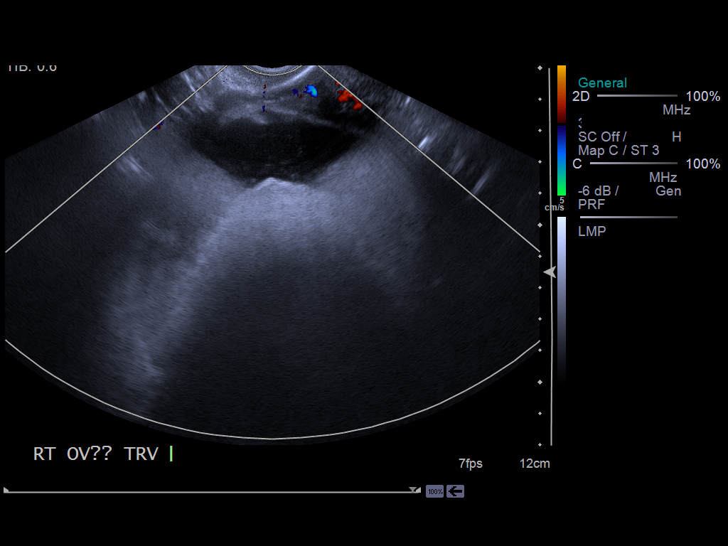
[im 97/129]
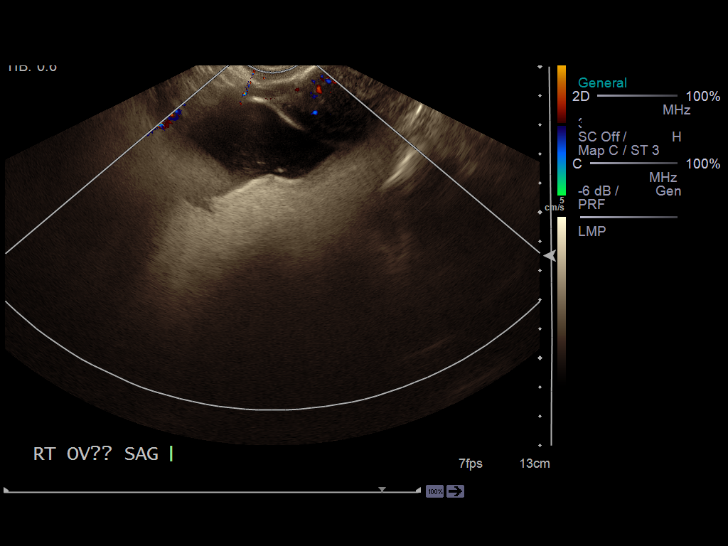
[im 107/129]
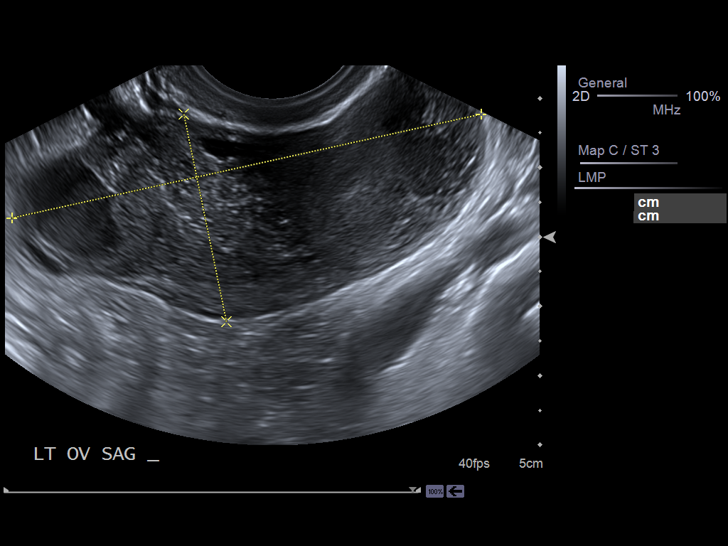
[im 118/129]
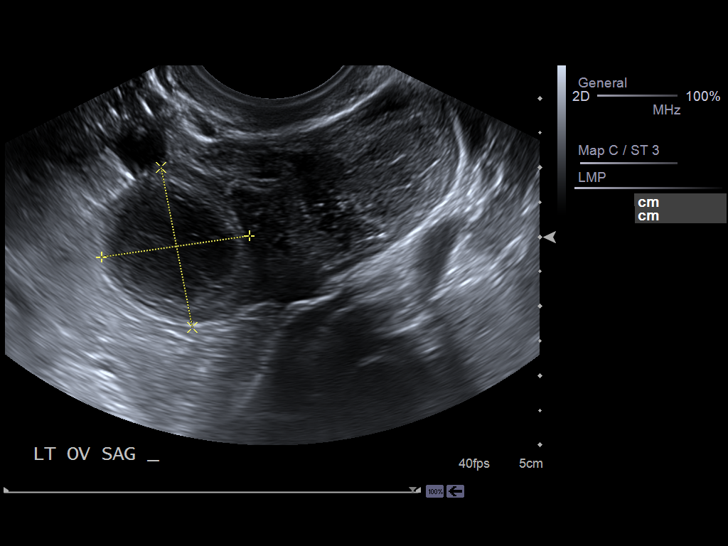
[im 129/129]
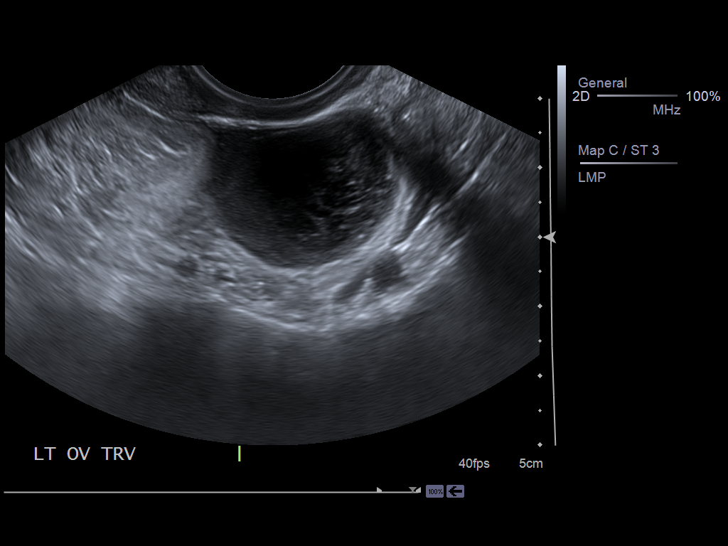

[13 of 25 positions shown; findings below may reference images not displayed]

It was necessary to proceed with endovaginal exam following the
transabdominal exam to visualize the right ovary and adnexa.
FINDINGS: Uterus: 7.5 cm length by 3.9 cm AP by 5.1 cm transverse.  Normal
morphology without mass.

Endometrium: 12 mm thick, slightly thickened.  No endometrial
fluid.  Nabothian cyst at cervix.

Right ovary:  Normal appearing right ovarian tissue is not
identified.  At right adnexa, a complex cystic and hyperechoic
masses identified 12.0 x 9.1 x 11.6 cm in size. Increased
echogenicity of the solid component is highly suggestive of fat.
Lesion is most suspicious for a large dermoid tumor arising from
the right ovary.

Left ovary: Enlarged, 6.9 x 3.1 x 5.0 cm.  Multiple cystic and
complicated cystic foci are identified, up to 2.4 cm in diameter.
No definite calcifications or hyperechoic foci seen to suggest fat.

Other findings: No free pelvic fluid or additional adnexal masses.
IMPRESSION: Mildly thickened nonspecific endometrial complex 12 mm thick.
Multiple simple and complicated cystic foci within left ovary up to
2.4 cm diameter.
Consider follow-up imaging after one to two menstrual cycles to
reassess, in order to exclude cystic tumor.
Large complicated cystic and hyperechoic mass of the right adnexa,
suspect right ovarian dermoid tumor 12.0 cm greatest size.

## 2012-05-29 ENCOUNTER — Encounter: Payer: Self-pay | Admitting: Family Medicine

## 2012-06-15 ENCOUNTER — Ambulatory Visit: Payer: BC Managed Care – PPO | Admitting: Family Medicine

## 2012-08-24 ENCOUNTER — Other Ambulatory Visit: Payer: Self-pay | Admitting: Family Medicine

## 2012-12-25 ENCOUNTER — Other Ambulatory Visit: Payer: Self-pay | Admitting: Family Medicine

## 2013-01-19 ENCOUNTER — Other Ambulatory Visit: Payer: Self-pay | Admitting: Family Medicine

## 2013-02-07 ENCOUNTER — Other Ambulatory Visit: Payer: Self-pay | Admitting: Family Medicine

## 2013-03-12 ENCOUNTER — Other Ambulatory Visit (HOSPITAL_COMMUNITY): Payer: Self-pay | Admitting: Obstetrics and Gynecology

## 2013-03-12 DIAGNOSIS — Z1231 Encounter for screening mammogram for malignant neoplasm of breast: Secondary | ICD-10-CM

## 2013-04-06 ENCOUNTER — Ambulatory Visit (HOSPITAL_COMMUNITY): Payer: BC Managed Care – PPO

## 2013-04-10 ENCOUNTER — Other Ambulatory Visit: Payer: Self-pay | Admitting: Family Medicine

## 2013-04-12 ENCOUNTER — Other Ambulatory Visit: Payer: Self-pay

## 2013-04-12 MED ORDER — CLONIDINE HCL 0.3 MG PO TABS: 0.3000 mg | ORAL_TABLET | Freq: Three times a day (TID) | ORAL | Status: DC

## 2013-04-12 MED ORDER — AMLODIPINE BESYLATE 10 MG PO TABS: 10.0000 mg | ORAL_TABLET | Freq: Every day | ORAL | Status: DC

## 2013-04-12 MED ORDER — FUROSEMIDE 40 MG PO TABS: ORAL_TABLET | ORAL | Status: DC

## 2013-04-12 MED ORDER — LOSARTAN POTASSIUM 100 MG PO TABS: 100.0000 mg | ORAL_TABLET | Freq: Every day | ORAL | Status: DC

## 2013-04-12 MED ORDER — POTASSIUM CHLORIDE CRYS ER 20 MEQ PO TBCR: EXTENDED_RELEASE_TABLET | ORAL | Status: DC

## 2013-04-19 ENCOUNTER — Ambulatory Visit (HOSPITAL_COMMUNITY): Payer: BC Managed Care – PPO

## 2013-05-03 ENCOUNTER — Encounter (INDEPENDENT_AMBULATORY_CARE_PROVIDER_SITE_OTHER): Payer: Self-pay

## 2013-05-03 ENCOUNTER — Encounter: Payer: Self-pay | Admitting: Family Medicine

## 2013-05-03 ENCOUNTER — Ambulatory Visit (INDEPENDENT_AMBULATORY_CARE_PROVIDER_SITE_OTHER): Payer: BC Managed Care – PPO | Admitting: Family Medicine

## 2013-05-03 VITALS — BP 142/90 | HR 88 | Resp 18 | Ht 65.0 in | Wt 297.1 lb

## 2013-05-03 DIAGNOSIS — Z1329 Encounter for screening for other suspected endocrine disorder: Secondary | ICD-10-CM

## 2013-05-03 DIAGNOSIS — I10 Essential (primary) hypertension: Secondary | ICD-10-CM

## 2013-05-03 DIAGNOSIS — Z13228 Encounter for screening for other metabolic disorders: Secondary | ICD-10-CM

## 2013-05-03 DIAGNOSIS — Z1239 Encounter for other screening for malignant neoplasm of breast: Secondary | ICD-10-CM

## 2013-05-03 DIAGNOSIS — E119 Type 2 diabetes mellitus without complications: Secondary | ICD-10-CM

## 2013-05-03 DIAGNOSIS — R9389 Abnormal findings on diagnostic imaging of other specified body structures: Secondary | ICD-10-CM

## 2013-05-03 DIAGNOSIS — E669 Obesity, unspecified: Secondary | ICD-10-CM

## 2013-05-03 DIAGNOSIS — Z1321 Encounter for screening for nutritional disorder: Secondary | ICD-10-CM

## 2013-05-03 DIAGNOSIS — E785 Hyperlipidemia, unspecified: Secondary | ICD-10-CM

## 2013-05-03 DIAGNOSIS — Z130 Encounter for screening for diseases of the blood and blood-forming organs and certain disorders involving the immune mechanism: Secondary | ICD-10-CM

## 2013-05-03 MED ORDER — TRIAMTERENE-HCTZ 37.5-25 MG PO TABS: 1.0000 | ORAL_TABLET | Freq: Every day | ORAL | Status: DC

## 2013-05-03 NOTE — Patient Instructions (Signed)
F/u in 5.5 weeks, call if you need me before  Fasting lipid, cmp and EGFR, hBA1C, cBc, TSH and microalb and vit D this Saturday  Medication for blood pressure is amlodipine, losartan and maxzide one each  It is important that you exercise regularly at least 30 minutes 5 times a week. If you develop chest pain, have severe difficulty breathing, or feel very tired, stop exercising immediately and seek medical attention   A healthy diet is rich in fruit, vegetables and whole grains. Poultry fish, nuts and beans are a healthy choice for protein rather then red meat. A low sodium diet and drinking 64 ounces of water daily is generally recommended. Oils and sweet should be limited. Carbohydrates especially for those who are diabetic or overweight, should be limited to 34-45 gram per meal. It is important to eat on a regular schedule, at least 3 times daily. Snacks should be primarily fruits, vegetables or nuts.   [facility]

## 2013-05-04 NOTE — Assessment & Plan Note (Signed)
Deteriorated. Patient re-educated about  the importance of commitment to a  minimum of 150 minutes of exercise per week. The importance of healthy food choices with portion control discussed. Encouraged to start a food diary, count calories and to consider  joining a support group. Sample diet sheets offered. Goals set by the patient for the next several months.    

## 2013-05-04 NOTE — Assessment & Plan Note (Signed)
Updated lab needed at/ before next visit. Patient advised to reduce carb and sweets, commit to regular physical activity, take meds as prescribed, test blood as directed, and attempt to lose weight, to improve blood sugar control. Not currently taking medication consistently, reports hypoglycemic episodes with glipizide low dose.Will await HBA1C before prescribing med

## 2013-05-04 NOTE — Assessment & Plan Note (Signed)
Hyperlipidemia:Low fat diet discussed and encouraged.  Pt aware that eshe will be started on a statin due to dx of doiabetes, dose will depend on lab data

## 2013-05-04 NOTE — Progress Notes (Signed)
   Subjective:    Patient ID: Christina Davenport, female    DOB: 08-26-1970, 43 y.o.   MRN: [id number]  HPI The PT is here for follow up and re-evaluation of chronic medical conditions, medication management and review of any available recent lab and radiology data.  Preventive health is updated, specifically  Cancer screening and Immunization.   Questions or concerns regarding consultations or procedures which the PT has had in the interim are  Addressed.Has recently had gyne eval, states she has been regularly followed up by neurology at [facility], still needs mammogram Stopped clonidine , blood pressure too low, also states glipizide dropped  Her blood sugar. She is concerned about weight gain      Review of Systems See HPI Denies recent fever or chills. Denies sinus pressure, nasal congestion, ear pain or sore throat. Denies chest congestion, productive cough or wheezing. Denies chest pains, palpitations and leg swelling Denies abdominal pain, nausea, vomiting,diarrhea or constipation.   Denies dysuria, frequency, hesitancy or incontinence. Denies joint pain, swelling and limitation in mobility. Denies headaches, seizures, numbness, or tingling. Denies depression, anxiety or insomnia. Denies skin break down or rash.        Objective:   Physical Exam BP 142/90  Pulse 88  Resp 18  Ht 5\' 5"  (1.651 m)  Wt 297 lb 1.3 oz (134.755 kg)  BMI 49.44 kg/m2  SpO2 100%  LMP 05/03/2013 Patient alert and oriented and in no cardiopulmonary distress.  HEENT: No facial asymmetry, EOMI, no sinus tenderness,  oropharynx pink and moist.  Neck supple no adenopathy.  Chest: Clear to auscultation bilaterally.  CVS: S1, S2 no murmurs, no S3.  ABD: Soft non tender. Bowel sounds normal.  Ext: No edema  MS: Adequate ROM spine, shoulders, hips and knees.  Skin: Intact, no ulcerations or rash noted.  Psych: Good eye contact, normal affect. Memory intact not anxious or depressed  appearing.  CNS: CN 2-12 intact, power, tone and sensation normal throughout.        Assessment & Plan:  HYPERTENSION Uncontrolled add maxzide DASH diet and commitment to daily physical activity for a minimum of 30 minutes discussed and encouraged, as a part of hypertension management. The importance of attaining a healthy weight is also discussed.   DIABETES MELLITUS, TYPE II Updated lab needed at/ before next visit. Patient advised to reduce carb and sweets, commit to regular physical activity, take meds as prescribed, test blood as directed, and attempt to lose weight, to improve blood sugar control. Not currently taking medication consistently, reports hypoglycemic episodes with glipizide low dose.Will await HBA1C before prescribing med   Dyslipidemia, goal LDL below 70 Hyperlipidemia:Low fat diet discussed and encouraged.  Pt aware that eshe will be started on a statin due to dx of doiabetes, dose will depend on lab data  OBESITY Deteriorated. Patient re-educated about  the importance of commitment to a  minimum of 150 minutes of exercise per week. The importance of healthy food choices with portion control discussed. Encouraged to start a food diary, count calories and to consider  joining a support group. Sample diet sheets offered. Goals set by the patient for the next several months.

## 2013-05-04 NOTE — Assessment & Plan Note (Signed)
Uncontrolled add maxzide DASH diet and commitment to daily physical activity for a minimum of 30 minutes discussed and encouraged, as a part of hypertension management. The importance of attaining a healthy weight is also discussed.

## 2013-05-12 ENCOUNTER — Other Ambulatory Visit: Payer: Self-pay | Admitting: Family Medicine

## 2013-05-14 ENCOUNTER — Ambulatory Visit: Payer: BC Managed Care – PPO

## 2013-06-14 ENCOUNTER — Ambulatory Visit: Payer: BC Managed Care – PPO | Admitting: Family Medicine

## 2013-08-25 ENCOUNTER — Telehealth: Payer: Self-pay | Admitting: Family Medicine

## 2013-08-25 DIAGNOSIS — I10 Essential (primary) hypertension: Secondary | ICD-10-CM

## 2013-08-25 DIAGNOSIS — E785 Hyperlipidemia, unspecified: Secondary | ICD-10-CM

## 2013-08-25 DIAGNOSIS — E119 Type 2 diabetes mellitus without complications: Secondary | ICD-10-CM

## 2013-08-25 NOTE — Telephone Encounter (Signed)
pls order hBA1C, lipid, cmp and EGFr, TSH , CBC and microalb, pRT ALSO nEEDS oV ABSOLUTELY no f/u visit is in her record, unable to care for [pt with no visit!

## 2013-08-25 NOTE — Addendum Note (Signed)
Addended by: Eual Fines on: 08/25/2013 03:38 PM   Modules accepted: Orders

## 2013-08-25 NOTE — Telephone Encounter (Signed)
Labs ordered and I sent her a letter to call to make an appointment and have her labs done as soon as possible

## 2013-09-13 ENCOUNTER — Other Ambulatory Visit: Payer: Self-pay

## 2013-09-13 DIAGNOSIS — E119 Type 2 diabetes mellitus without complications: Secondary | ICD-10-CM

## 2013-09-13 DIAGNOSIS — E785 Hyperlipidemia, unspecified: Secondary | ICD-10-CM

## 2013-10-11 ENCOUNTER — Ambulatory Visit: Payer: BC Managed Care – PPO | Admitting: Family Medicine

## 2013-10-25 ENCOUNTER — Telehealth: Payer: Self-pay | Admitting: Family Medicine

## 2013-10-25 NOTE — Telephone Encounter (Signed)
Noted and lab order has been faxed to lab.

## 2013-10-26 ENCOUNTER — Ambulatory Visit: Payer: BC Managed Care – PPO | Admitting: Family Medicine

## 2013-10-28 ENCOUNTER — Other Ambulatory Visit: Payer: Self-pay | Admitting: Family Medicine

## 2013-10-28 LAB — LIPID PANEL
Cholesterol: 182 mg/dL (ref 0–200)
HDL: 51 mg/dL (ref >39)
LDL CALC: 118 mg/dL — AB (ref 0–99)
Total CHOL/HDL Ratio: 3.6 Ratio
Triglycerides: 65 mg/dL (ref <150)
VLDL: 13 mg/dL (ref 0–40)

## 2013-10-28 LAB — HEMOGLOBIN A1C
HEMOGLOBIN A1C: 6.1 % — AB (ref <5.7)
Mean Plasma Glucose: 128 mg/dL — ABNORMAL HIGH (ref <117)

## 2013-11-02 ENCOUNTER — Ambulatory Visit (INDEPENDENT_AMBULATORY_CARE_PROVIDER_SITE_OTHER): Payer: BC Managed Care – PPO | Admitting: Family Medicine

## 2013-11-02 ENCOUNTER — Encounter: Payer: Self-pay | Admitting: Family Medicine

## 2013-11-02 VITALS — BP 166/94 | HR 94 | Resp 16 | Ht 65.0 in | Wt 301.8 lb

## 2013-11-02 DIAGNOSIS — I10 Essential (primary) hypertension: Secondary | ICD-10-CM

## 2013-11-02 DIAGNOSIS — E119 Type 2 diabetes mellitus without complications: Secondary | ICD-10-CM

## 2013-11-02 DIAGNOSIS — E785 Hyperlipidemia, unspecified: Secondary | ICD-10-CM

## 2013-11-02 DIAGNOSIS — E669 Obesity, unspecified: Secondary | ICD-10-CM

## 2013-11-02 MED ORDER — ATORVASTATIN CALCIUM 10 MG PO TABS: 10.0000 mg | ORAL_TABLET | Freq: Every day | ORAL | Status: DC

## 2013-11-02 MED ORDER — METFORMIN HCL ER (MOD) 500 MG PO TB24: 500.0000 mg | ORAL_TABLET | Freq: Every day | ORAL | Status: DC

## 2013-11-02 NOTE — Assessment & Plan Note (Signed)
Deteriorated. Patient re-educated about  the importance of commitment to a  minimum of 150 minutes of exercise per week. The importance of healthy food choices with portion control discussed. Encouraged to start a food diary, count calories and to consider  joining a support group. Sample diet sheets offered. Goals set by the patient for the next several months.    

## 2013-11-02 NOTE — Assessment & Plan Note (Signed)
Hyperlipidemia:Low fat diet discussed and encouraged.  Updated lab needed at/ before next visit. Pt to start a statin

## 2013-11-02 NOTE — Assessment & Plan Note (Signed)
Improved, pt not taking any med, she is applauded about this   She is to start metformin once daily to help with both blood sugar and with weight  Patient advised to reduce carb and sweets, commit to regular physical activity, take meds as prescribed, test blood as directed, and attempt to lose weight, to improve blood sugar control.

## 2013-11-02 NOTE — Progress Notes (Signed)
   Subjective:    Patient ID: Christina Davenport, female    DOB: Nov 03, 1970, 43 y.o.   MRN: [id number]  HPI The PT is here for follow up and re-evaluation of chronic medical conditions, medication management and review of any available recent lab and radiology data.  Preventive health is updated, specifically  Cancer screening and Immunization.   Hsas been taking one and not the three  BP meds she is prescribed, unintentionally, did not realize she needed them and gets "vey good BP at home" States she has had pelvic and eye exam in the past 12 month The PT denies any adverse reactions to current medications since the last visit.  There are no new concerns.  There are no specific complaints  Does feel the need to get out more since she now works from home Also knows she needs to work on weigh loss, states she is more invested in well health Sees neurology at [facility] and is doing well      Review of Systems See HPI Denies recent fever or chills. Denies sinus pressure, nasal congestion, ear pain or sore throat. Denies chest congestion, productive cough or wheezing. Denies chest pains, palpitations and leg swelling Denies abdominal pain, nausea, vomiting,diarrhea or constipation.   Denies dysuria, frequency, hesitancy or incontinence. Denies joint pain, swelling and limitation in mobility. Denies headaches, seizures, numbness, or tingling. Denies depression, anxiety or insomnia. Denies skin break down or rash.        Objective:   Physical Exam BP 166/94  Pulse 94  Resp 16  Ht 5\' 5"  (1.651 m)  Wt 301 lb 12.8 oz (136.896 kg)  BMI 50.22 kg/m2  SpO2 98% Patient alert and oriented and in no cardiopulmonary distress.  HEENT:  facial asymmetry, EOMI,   oropharynx pink and moist.  Neck supple no JVD, no mass.  Chest: Clear to auscultation bilaterally.  CVS: S1, S2 no murmurs, no S3.Regular rate.  ABD: Soft non tender.   Ext: No edema  MS: Adequate ROM spine,  shoulders, hips and knees.  Skin: Intact, no ulcerations or rash noted.  Psych: Good eye contact, normal affect. Memory intact not anxious or depressed appearing.  CNS: CN 2-12 intact, power,  normal throughout.no focal deficits noted.        Assessment & Plan:  HYPERTENSION Uncontrolled, med non compli DASH diet and commitment to daily physical activity for a minimum of 30 minutes discussed and encouraged, as a part of hypertension management. The importance of attaining a healthy weight is also discussed. ance Pt re educated   DIABETES MELLITUS, TYPE II Improved, pt not taking any med, she is applauded about this   She is to start metformin once daily to help with both blood sugar and with weight  Patient advised to reduce carb and sweets, commit to regular physical activity, take meds as prescribed, test blood as directed, and attempt to lose weight, to improve blood sugar control.   OBESITY Deteriorated. Patient re-educated about  the importance of commitment to a  minimum of 150 minutes of exercise per week. The importance of healthy food choices with portion control discussed. Encouraged to start a food diary, count calories and to consider  joining a support group. Sample diet sheets offered. Goals set by the patient for the next several months.     Hyperlipidemia LDL goal <100 Hyperlipidemia:Low fat diet discussed and encouraged.  Updated lab needed at/ before next visit. Pt to start a statin

## 2013-11-02 NOTE — Addendum Note (Signed)
Addended by: Fayrene Helper on: 11/02/2013 10:24 PM   Modules accepted: Orders

## 2013-11-02 NOTE — Patient Instructions (Addendum)
F/u Dec 8 or after , call if you need me before  Blood pressure is high, youn need to take THREE tablets every day for blood pressure and protection of your kidney function, amlodipine, cozaar and triamterene   New for diabetes is metformin one every morning  New for cholesterol is lipitor one at bedtime  It is important that you exercise regularly at least 30 minutes 5 times a week. If you develop chest pain, have severe difficulty breathing, or feel very tired, stop exercising immediately and seek medical attention   Weight loss goal of 6 pounds  Microalb today  Sign for eye exam from 2014 please  Fasting lipid, cmp and eGFr, hBA1C  Dec 3 or after, at least 4 days before f/u

## 2013-11-02 NOTE — Assessment & Plan Note (Signed)
Uncontrolled, med non compli DASH diet and commitment to daily physical activity for a minimum of 30 minutes discussed and encouraged, as a part of hypertension management. The importance of attaining a healthy weight is also discussed. ance Pt re educated

## 2013-11-03 ENCOUNTER — Telehealth: Payer: Self-pay | Admitting: Family Medicine

## 2013-11-03 DIAGNOSIS — E785 Hyperlipidemia, unspecified: Secondary | ICD-10-CM

## 2013-11-03 DIAGNOSIS — E119 Type 2 diabetes mellitus without complications: Secondary | ICD-10-CM

## 2013-11-03 LAB — MICROALBUMIN / CREATININE URINE RATIO
Creatinine, Urine: 182 mg/dL
MICROALB UR: 1.67 mg/dL (ref 0.00–1.89)
Microalb Creat Ratio: 9.2 mg/g (ref 0.0–30.0)

## 2013-11-03 MED ORDER — METFORMIN HCL ER (MOD) 500 MG PO TB24: 500.0000 mg | ORAL_TABLET | Freq: Every day | ORAL | Status: DC

## 2013-11-03 MED ORDER — TRIAMTERENE-HCTZ 37.5-25 MG PO TABS: ORAL_TABLET | ORAL | Status: DC

## 2013-11-03 MED ORDER — AMLODIPINE BESYLATE 10 MG PO TABS: ORAL_TABLET | ORAL | Status: DC

## 2013-11-03 MED ORDER — ATORVASTATIN CALCIUM 10 MG PO TABS: 10.0000 mg | ORAL_TABLET | Freq: Every day | ORAL | Status: DC

## 2013-11-03 MED ORDER — LOSARTAN POTASSIUM 100 MG PO TABS: ORAL_TABLET | ORAL | Status: DC

## 2013-11-03 NOTE — Telephone Encounter (Signed)
meds resent

## 2013-12-10 ENCOUNTER — Other Ambulatory Visit: Payer: Self-pay

## 2014-02-01 ENCOUNTER — Encounter: Payer: Self-pay | Admitting: *Deleted

## 2014-02-01 ENCOUNTER — Ambulatory Visit: Payer: BC Managed Care – PPO | Admitting: Family Medicine

## 2014-02-01 ENCOUNTER — Other Ambulatory Visit: Payer: Self-pay | Admitting: Family Medicine

## 2014-02-01 DIAGNOSIS — E669 Obesity, unspecified: Principal | ICD-10-CM

## 2014-02-01 DIAGNOSIS — E1169 Type 2 diabetes mellitus with other specified complication: Secondary | ICD-10-CM

## 2014-02-01 NOTE — Progress Notes (Signed)
   Subjective:    Patient ID: Christina Davenport, female    DOB: 1970/10/25, 43 y.o.   MRN: [id number]  HPI  Pt did not come in fore visit   Review of Systems     Objective:   Physical Exam        Assessment & Plan:

## 2014-02-03 ENCOUNTER — Ambulatory Visit (INDEPENDENT_AMBULATORY_CARE_PROVIDER_SITE_OTHER): Payer: BC Managed Care – PPO

## 2014-02-03 ENCOUNTER — Ambulatory Visit: Payer: BC Managed Care – PPO

## 2014-02-03 DIAGNOSIS — Z23 Encounter for immunization: Secondary | ICD-10-CM

## 2014-03-17 ENCOUNTER — Encounter: Payer: Self-pay | Admitting: Family Medicine

## 2014-03-17 ENCOUNTER — Ambulatory Visit (INDEPENDENT_AMBULATORY_CARE_PROVIDER_SITE_OTHER): Payer: BC Managed Care – PPO | Admitting: Family Medicine

## 2014-03-17 VITALS — BP 120/80 | HR 98 | Resp 18 | Ht 65.0 in | Wt 311.1 lb

## 2014-03-17 DIAGNOSIS — E119 Type 2 diabetes mellitus without complications: Secondary | ICD-10-CM | POA: Diagnosis not present

## 2014-03-17 DIAGNOSIS — Z23 Encounter for immunization: Secondary | ICD-10-CM

## 2014-03-17 DIAGNOSIS — I10 Essential (primary) hypertension: Secondary | ICD-10-CM | POA: Diagnosis not present

## 2014-03-17 DIAGNOSIS — N8320 Unspecified ovarian cysts: Secondary | ICD-10-CM

## 2014-03-17 DIAGNOSIS — E785 Hyperlipidemia, unspecified: Secondary | ICD-10-CM

## 2014-03-17 DIAGNOSIS — N83209 Unspecified ovarian cyst, unspecified side: Secondary | ICD-10-CM

## 2014-03-17 LAB — COMPLETE METABOLIC PANEL WITH GFR
ALBUMIN: 4 g/dL (ref 3.5–5.2)
ALK PHOS: 36 U/L — AB (ref 39–117)
ALT: 15 U/L (ref 0–35)
AST: 19 U/L (ref 0–37)
BUN: 18 mg/dL (ref 6–23)
CHLORIDE: 97 meq/L (ref 96–112)
CO2: 27 meq/L (ref 19–32)
CREATININE: 1.15 mg/dL — AB (ref 0.50–1.10)
Calcium: 10.1 mg/dL (ref 8.4–10.5)
GFR, EST NON AFRICAN AMERICAN: 59 mL/min — AB
GFR, Est African American: 68 mL/min
Glucose, Bld: 86 mg/dL (ref 70–99)
Potassium: 4.2 mEq/L (ref 3.5–5.3)
SODIUM: 136 meq/L (ref 135–145)
TOTAL PROTEIN: 7.7 g/dL (ref 6.0–8.3)
Total Bilirubin: 0.6 mg/dL (ref 0.2–1.2)

## 2014-03-17 LAB — CBC
HEMATOCRIT: 36.3 % (ref 36.0–46.0)
Hemoglobin: 12.1 g/dL (ref 12.0–15.0)
MCH: 26.3 pg (ref 26.0–34.0)
MCHC: 33.3 g/dL (ref 30.0–36.0)
MCV: 78.9 fL (ref 78.0–100.0)
MPV: 10.3 fL (ref 8.6–12.4)
Platelets: 243 10*3/uL (ref 150–400)
RBC: 4.6 MIL/uL (ref 3.87–5.11)
RDW: 15.1 % (ref 11.5–15.5)
WBC: 8.5 10*3/uL (ref 4.0–10.5)

## 2014-03-17 LAB — LIPID PANEL
CHOL/HDL RATIO: 3.2 ratio
Cholesterol: 187 mg/dL (ref 0–200)
HDL: 59 mg/dL (ref >39)
LDL CALC: 106 mg/dL — AB (ref 0–99)
TRIGLYCERIDES: 108 mg/dL (ref <150)
VLDL: 22 mg/dL (ref 0–40)

## 2014-03-17 NOTE — Patient Instructions (Addendum)
F/u in 4 month, call if you need me before  No change in blood pressu re meds, change position slowly, take evening med at 8 pm   HBA1C, cmp and EGFR, lipid, , CBC, TSH and Vit D today  Prevnar today

## 2014-03-17 NOTE — Progress Notes (Signed)
   Subjective:    Patient ID: Christina Davenport, female    DOB: Jun 25, 1970, 44 y.o.   MRN: [id number]  HPI The PT is here for follow up and re-evaluation of chronic medical conditions, medication management and review of any available recent lab and radiology data.  Preventive health is updated, specifically  Cancer screening and Immunization.   Questions or concerns regarding consultations or procedures which the PT has had in the interim are  Addressed.Has upcoming gyne appt next week, which I have stressed that it is very important she keep, which she intends to. States that about 3 days out of 7 , she experiences severe dizziness when she raises her head up from bending down early in the am, no symptoms of hypoglycemia , thinks likely related to time she is taking pm antihypertensive.  Has holiday weight gain which she will work on    Review of Systems See HPI Denies recent fever or chills. Denies sinus pressure, nasal congestion, ear pain or sore throat. Denies chest congestion, productive cough or wheezing. Denies chest pains, palpitations and leg swelling Denies abdominal pain, nausea, vomiting,diarrhea or constipation.   Denies dysuria, frequency, hesitancy or incontinence. Denies joint pain, swelling and limitation in mobility. Denies headaches, seizures, numbness, or tingling. Denies depression, anxiety or insomnia. Denies skin break down or rash.        Objective:   Physical Exam BP 120/80 mmHg  Pulse 98  Resp 18  Ht 5\' 5"  (1.651 m)  Wt 311 lb 1.3 oz (141.105 kg)  BMI 51.77 kg/m2  SpO2 100%  Patient alert and oriented and in no cardiopulmonary distress.  HEENT: No facial asymmetry, EOMI,   oropharynx pink and moist.  Neck supple no JVD, no mass.  Chest: Clear to auscultation bilaterally.  CVS: S1, S2 no murmurs, no S3.Regular rate.  ABD: Soft non tender.   Ext: No edema  MS: Adequate ROM spine, shoulders, hips and knees.  Skin: Intact, no ulcerations  or rash noted.  Psych: Good eye contact, normal affect. Memory intact not anxious or depressed appearing.  CNS: CN 2-12 intact, power,  normal throughout.no focal deficits noted.        Assessment & Plan:  Essential hypertension Controlled, no change in medication DASH diet and commitment to daily physical activity for a minimum of 30 minutes discussed and encouraged, as a part of hypertension management. The importance of attaining a healthy weight is also discussed.    Hyperlipidemia LDL goal <100 Improved , but not at goal Hyperlipidemia:Low fat diet discussed and encouraged.  Updated lab needed at/ before next visit.    Morbid obesity Deteriorated. Patient re-educated about  the importance of commitment to a  minimum of 150 minutes of exercise per week. The importance of healthy food choices with portion control discussed. Encouraged to start a food diary, count calories and to consider  joining a support group. Sample diet sheets offered. Goals set by the patient for the next several months.      Ovarian cyst appt with gyne next week to follow up on this   Diabetes mellitus without complication Controlled, no change in medication Patient advised to reduce carb and sweets, commit to regular physical activity, take meds as prescribed, test blood as directed, and attempt to lose weight, to improve blood sugar control.    Need for vaccination with 13-polyvalent pneumococcal conjugate vaccine Vaccine administered at visit.

## 2014-03-18 DIAGNOSIS — Z23 Encounter for immunization: Secondary | ICD-10-CM | POA: Insufficient documentation

## 2014-03-18 LAB — HEMOGLOBIN A1C
Hgb A1c MFr Bld: 6.1 % — ABNORMAL HIGH (ref <5.7)
Mean Plasma Glucose: 128 mg/dL — ABNORMAL HIGH (ref <117)

## 2014-03-18 LAB — TSH: TSH: 1.157 u[IU]/mL (ref 0.350–4.500)

## 2014-03-18 NOTE — Assessment & Plan Note (Signed)
Controlled, no change in medication Patient advised to reduce carb and sweets, commit to regular physical activity, take meds as prescribed, test blood as directed, and attempt to lose weight, to improve blood sugar control.  

## 2014-03-18 NOTE — Assessment & Plan Note (Signed)
Improved , but not at goal Hyperlipidemia:Low fat diet discussed and encouraged.  Updated lab needed at/ before next visit.

## 2014-03-18 NOTE — Assessment & Plan Note (Signed)
Deteriorated. Patient re-educated about  the importance of commitment to a  minimum of 150 minutes of exercise per week. The importance of healthy food choices with portion control discussed. Encouraged to start a food diary, count calories and to consider  joining a support group. Sample diet sheets offered. Goals set by the patient for the next several months.    

## 2014-03-18 NOTE — Assessment & Plan Note (Signed)
Controlled, no change in medication DASH diet and commitment to daily physical activity for a minimum of 30 minutes discussed and encouraged, as a part of hypertension management. The importance of attaining a healthy weight is also discussed.  

## 2014-03-18 NOTE — Assessment & Plan Note (Signed)
appt with gyne next week to follow up on this

## 2014-03-18 NOTE — Assessment & Plan Note (Signed)
Vaccine administered at visit.  

## 2014-03-19 ENCOUNTER — Encounter: Payer: Self-pay | Admitting: Family Medicine

## 2014-03-22 ENCOUNTER — Telehealth: Payer: Self-pay

## 2014-03-22 DIAGNOSIS — E119 Type 2 diabetes mellitus without complications: Secondary | ICD-10-CM

## 2014-03-22 DIAGNOSIS — E785 Hyperlipidemia, unspecified: Secondary | ICD-10-CM

## 2014-03-22 NOTE — Telephone Encounter (Signed)
Lab order to be completed prior to next visit. Orders placed

## 2014-05-03 ENCOUNTER — Telehealth: Payer: Self-pay | Admitting: Family Medicine

## 2014-05-03 NOTE — Telephone Encounter (Signed)
Patient had to cancel Jan appt because of death in the family and I explained that you were concerned and needed to reschedule asap. She said she will do it before she comes back here in may.

## 2014-05-03 NOTE — Telephone Encounter (Signed)
Pls cal pt and spk directly with her. Let her know that i ma concerned that she still ahsd not followed  Up with gyne as i have discussed with her and Dr Cousin's office also contacted her. Pls SEND me back her response as far as gyne f/u eval with Dr Garwin Brothers, past due and Dr Garwin Brothers s concerns as I have made her aware VERY IMPT  ( If unable to spk with her by Thursday this week, also mail her a certified letter to call and let me know, thanks)

## 2014-05-03 NOTE — Telephone Encounter (Signed)
Called patient and left message for them to return call at the office   

## 2014-07-19 ENCOUNTER — Ambulatory Visit: Payer: BC Managed Care – PPO | Admitting: Family Medicine

## 2014-07-25 ENCOUNTER — Other Ambulatory Visit: Payer: Self-pay | Admitting: Family Medicine

## 2014-08-22 ENCOUNTER — Ambulatory Visit: Payer: BLUE CROSS/BLUE SHIELD | Admitting: Family Medicine

## 2015-01-06 ENCOUNTER — Telehealth: Payer: Self-pay | Admitting: Family Medicine

## 2015-01-06 ENCOUNTER — Other Ambulatory Visit: Payer: Self-pay | Admitting: Family Medicine

## 2015-01-06 DIAGNOSIS — E785 Hyperlipidemia, unspecified: Secondary | ICD-10-CM

## 2015-01-06 DIAGNOSIS — E119 Type 2 diabetes mellitus without complications: Secondary | ICD-10-CM

## 2015-01-06 MED ORDER — AMLODIPINE BESYLATE 10 MG PO TABS: 10.0000 mg | ORAL_TABLET | Freq: Every day | ORAL | Status: DC

## 2015-01-06 MED ORDER — METFORMIN HCL ER (MOD) 500 MG PO TB24: 500.0000 mg | ORAL_TABLET | Freq: Every day | ORAL | Status: DC

## 2015-01-06 MED ORDER — ATORVASTATIN CALCIUM 10 MG PO TABS: 10.0000 mg | ORAL_TABLET | Freq: Every day | ORAL | Status: DC

## 2015-01-06 MED ORDER — TRIAMTERENE-HCTZ 37.5-25 MG PO TABS: ORAL_TABLET | ORAL | Status: DC

## 2015-01-06 MED ORDER — LOSARTAN POTASSIUM 100 MG PO TABS: 100.0000 mg | ORAL_TABLET | Freq: Every day | ORAL | Status: DC

## 2015-01-06 NOTE — Telephone Encounter (Signed)
Patient has rescheduled her appointment to Jan 9th at 3:30 and she is asking for her current medications to be refilled until office visit, please advise?

## 2015-01-06 NOTE — Telephone Encounter (Signed)
1 refill ONLY given

## 2015-02-08 ENCOUNTER — Telehealth: Payer: Self-pay | Admitting: Family Medicine

## 2015-02-08 NOTE — Telephone Encounter (Signed)
I spoke directly with the opt and explained the urgent need to have gybne evaluate her ovarian lesion. She stated that she had explained to Dr Garwin Brothers in the past that Dr Glo Herring told her this was her polycystic disease and that Dr Garwin Brothers had told her she needed th ovary removed. I explained and she promises to schedule an appt with gyne of her choice, ehich is Dr Garwin Brothers to re evaluate anve ovarian lesionm managed as deemed necessary

## 2015-02-14 ENCOUNTER — Other Ambulatory Visit: Payer: Self-pay | Admitting: Family Medicine

## 2015-02-14 DIAGNOSIS — Z114 Encounter for screening for human immunodeficiency virus [HIV]: Secondary | ICD-10-CM

## 2015-02-14 DIAGNOSIS — I10 Essential (primary) hypertension: Secondary | ICD-10-CM

## 2015-02-14 DIAGNOSIS — E1169 Type 2 diabetes mellitus with other specified complication: Secondary | ICD-10-CM

## 2015-02-14 DIAGNOSIS — E669 Obesity, unspecified: Principal | ICD-10-CM

## 2015-02-14 LAB — COMPLETE METABOLIC PANEL WITH GFR
ALT: 14 U/L (ref 6–29)
AST: 17 U/L (ref 10–30)
Albumin: 4.1 g/dL (ref 3.6–5.1)
Alkaline Phosphatase: 43 U/L (ref 33–115)
BILIRUBIN TOTAL: 0.5 mg/dL (ref 0.2–1.2)
BUN: 18 mg/dL (ref 7–25)
CO2: 26 mmol/L (ref 20–31)
Calcium: 9.1 mg/dL (ref 8.6–10.2)
Chloride: 101 mmol/L (ref 98–110)
Creat: 1.63 mg/dL — ABNORMAL HIGH (ref 0.50–1.10)
GFR, EST NON AFRICAN AMERICAN: 38 mL/min — AB (ref >=60)
GFR, Est African American: 44 mL/min — ABNORMAL LOW (ref >=60)
GLUCOSE: 101 mg/dL — AB (ref 65–99)
POTASSIUM: 4.3 mmol/L (ref 3.5–5.3)
SODIUM: 137 mmol/L (ref 135–146)
TOTAL PROTEIN: 7.3 g/dL (ref 6.1–8.1)

## 2015-02-14 LAB — LIPID PANEL
Cholesterol: 204 mg/dL — ABNORMAL HIGH (ref 125–200)
HDL: 44 mg/dL — AB (ref >=46)
LDL CALC: 143 mg/dL — AB (ref <130)
LDL Cholesterol: 143 mg/dL
Total CHOL/HDL Ratio: 4.6 Ratio (ref <=5.0)
Triglycerides: 85 mg/dL (ref <150)
VLDL: 17 mg/dL (ref <30)

## 2015-02-14 LAB — HEMOGLOBIN A1C
HEMOGLOBIN A1C: 6 % — AB (ref <5.7)
HEMOGLOBIN A1C: 6
MEAN PLASMA GLUCOSE: 126 mg/dL — AB (ref <117)

## 2015-02-21 ENCOUNTER — Ambulatory Visit (INDEPENDENT_AMBULATORY_CARE_PROVIDER_SITE_OTHER): Payer: BLUE CROSS/BLUE SHIELD | Admitting: Family Medicine

## 2015-02-21 ENCOUNTER — Encounter: Payer: Self-pay | Admitting: Family Medicine

## 2015-02-21 VITALS — BP 158/94 | HR 120 | Resp 18 | Ht 65.0 in | Wt 302.0 lb

## 2015-02-21 DIAGNOSIS — I10 Essential (primary) hypertension: Secondary | ICD-10-CM | POA: Diagnosis not present

## 2015-02-21 DIAGNOSIS — Z1159 Encounter for screening for other viral diseases: Secondary | ICD-10-CM

## 2015-02-21 DIAGNOSIS — E1169 Type 2 diabetes mellitus with other specified complication: Secondary | ICD-10-CM

## 2015-02-21 DIAGNOSIS — E119 Type 2 diabetes mellitus without complications: Secondary | ICD-10-CM

## 2015-02-21 DIAGNOSIS — N183 Chronic kidney disease, stage 3 unspecified: Secondary | ICD-10-CM

## 2015-02-21 DIAGNOSIS — E669 Obesity, unspecified: Secondary | ICD-10-CM

## 2015-02-21 DIAGNOSIS — Z23 Encounter for immunization: Secondary | ICD-10-CM | POA: Diagnosis not present

## 2015-02-21 DIAGNOSIS — E785 Hyperlipidemia, unspecified: Secondary | ICD-10-CM

## 2015-02-21 MED ORDER — CLONIDINE HCL 0.2 MG PO TABS: ORAL_TABLET | ORAL | Status: DC

## 2015-02-21 NOTE — Assessment & Plan Note (Signed)
Improved Patient re-educated about  the importance of commitment to a  minimum of 150 minutes of exercise per week.  The importance of healthy food choices with portion control discussed. Encouraged to start a food diary, count calories and to consider  joining a support group. Sample diet sheets offered. Goals set by the patient for the next several months.   Weight /BMI 02/21/2015 03/17/2014 11/02/2013  WEIGHT 302 lb 0.6 oz 311 lb 1.3 oz 301 lb 12.8 oz  HEIGHT 5\' 5"  5\' 5"  5\' 5"   BMI 50.26 kg/m2 51.77 kg/m2 50.22 kg/m2    Current exercise per week 60 minutes.Plans to start with opersonal trainer in the [employer]

## 2015-02-21 NOTE — Assessment & Plan Note (Signed)
D/C NSAIDS, add clonidine , rept in 8 weeks

## 2015-02-21 NOTE — Patient Instructions (Addendum)
F/u in 8 weeks  No IBUPROFEN , this damages the kidney, only tylenol for pain  Blood pressure is high, NEED to take the THREE BP meds listed consistently at the same time daily, triamterene, amlodipine and clonidine  Cholesterol is high, reduce fat in diet, also take atorvastatin  nigt along with clonidine  CONGRATS on improved blood sugar and weight loss  Non fast chem 7 and EGFR  And HIV in 8 weeks  Thanks for choosing [facility], we consider it a privelige to serve you.  All the best for 2017  Flu vaccine and foot [id number] today 

## 2015-02-21 NOTE — Progress Notes (Signed)
Subjective:    Patient ID: Christina Davenport, female    DOB: [birth year], 44 y.o.   MRN: [id number]  HPI   Christina Davenport     MRN: [id number]      DOB: Jan 12, 1971   HPI Christina Davenport is here for follow up and re-evaluation of chronic medical conditions, medication management and review of any available recent lab and radiology data.  Preventive health is updated, specifically  Cancer screening and Immunization.   Questions or concerns regarding consultations or procedures which the PT has had in the interim are  addressed. The PT  Reports metformin intolerance and did not take this.  Has not been on cozaar, though prescribed, and does not have medication at visit There are no new concerns.  There are no specific complaints   ROS Denies recent fever or chills. Denies sinus pressure, nasal congestion, ear pain or sore throat. Denies chest congestion, productive cough or wheezing. Denies chest pains, palpitations and leg swelling Denies abdominal pain, nausea, vomiting,diarrhea or constipation.   Denies dysuria, frequency, hesitancy or incontinence. Denies joint pain, swelling and limitation in mobility. Denies headaches, seizures, numbness, or tingling. Denies depression, anxiety or insomnia. Denies skin break down or rash.   PE  BP 158/94 mmHg  Pulse 120  Resp 18  Ht 5\' 5"  (1.651 m)  Wt 302 lb 0.6 oz (137.004 kg)  BMI 50.26 kg/m2  SpO2 99%  LMP 02/10/2015 (Exact Date)  Patient alert and oriented and in no cardiopulmonary distress.  HEENT: No facial asymmetry, EOMI,   oropharynx pink and moist.  Neck supple no JVD, no mass.  Chest: Clear to auscultation bilaterally.  CVS: S1, S2 no murmurs, no S3.Regular rate.  ABD: Soft non tender.   Ext: No edema  MS: Adequate ROM spine, shoulders, hips and knees.  Skin: Intact, no ulcerations or rash noted.  Psych: Good eye contact, normal affect. Memory intact not anxious or depressed appearing.  CNS: CN 2-12 intact,  power,  normal throughout.no focal deficits noted.   Assessment & Plan   Essential hypertension Uncontrolleed, non compliant , off of cozaar, start clonidine in place of this DASH diet and commitment to daily physical activity for a minimum of 30 minutes discussed and encouraged, as a part of hypertension management. The importance of attaining a healthy weight is also discussed.  BP/Weight 02/21/2015 03/17/2014 11/02/2013 05/03/2013 01/08/2012 [id number]  Systolic BP 0000000 123456 [id number] Q000111Q Q000111Q [id number]  Diastolic BP 94 80 94 90 90 90 80  Wt. (Lbs) 302.04 311.08 301.8 297.08 289.04 272.4 274  BMI 50.26 51.77 50.22 49.44 48.1 45.33 45.6        Diabetes mellitus type 2 in obese (HCC) Improved, managed by diet only Christina Davenport is reminded of the importance of commitment to daily physical activity for 30 minutes or more, as able and the need to limit carbohydrate intake to 30 to 60 grams per meal to help with blood sugar control.   The need to take medication as prescribed, test blood sugar as directed, and to call between visits if there is a concern that blood sugar is uncontrolled is also discussed.   Christina Davenport is reminded of the importance of daily foot exam, annual eye examination, and good blood sugar, blood pressure and cholesterol control.  Diabetic Labs Latest Ref Rng 02/14/2015 03/17/2014 11/02/2013 10/28/2013 12/30/2011  HbA1c - 6.0 6.1(H) - 6.1(H) 6.8(H)  Microalbumin 0.00 - 1.89 mg/dL - - 1.67 - 6.26(H)  Micro/Creat Ratio 0.0 -  30.0 mg/g - - 9.2 - 14.7  Chol 0 - 200 mg/dL - 187 - 182 -  HDL >39 mg/dL - 59 - 51 -  Calc LDL - 143 106(H) - 118(H) -  Triglycerides <150 mg/dL - 108 - 65 -  Creatinine 0.50 - 1.10 mg/dL - 1.15(H) - - 1.08   BP/Weight 02/21/2015 03/17/2014 11/02/2013 05/03/2013 01/08/2012 [id number]  Systolic BP 0000000 123456 [id number] Q000111Q Q000111Q [id number]  Diastolic BP 94 80 94 90 90 90 80  Wt. (Lbs) 302.04 311.08 301.8 297.08 289.04 272.4 274  BMI 50.26 51.77 50.22 49.44 48.1  45.33 45.6   Foot/eye exam completion dates Latest Ref Rng 02/21/2015 11/02/2013  Eye Exam No Retinopathy - -  Foot exam Order - - -  Foot Form Completion - Done Done         Morbid obesity Improved Patient re-educated about  the importance of commitment to a  minimum of 150 minutes of exercise per week.  The importance of healthy food choices with portion control discussed. Encouraged to start a food diary, count calories and to consider  joining a support group. Sample diet sheets offered. Goals set by the patient for the next several months.   Weight /BMI 02/21/2015 03/17/2014 11/02/2013  WEIGHT 302 lb 0.6 oz 311 lb 1.3 oz 301 lb 12.8 oz  HEIGHT 5\' 5"  5\' 5"  5\' 5"   BMI 50.26 kg/m2 51.77 kg/m2 50.22 kg/m2    Current exercise per week 60 minutes.Plans to start with opersonal trainer in the New Year   Hyperlipidemia LDL goal <100 Uncontrolled Hyperlipidemia:Low fat diet discussed and encouraged.   Lipid Panel  Lab Results  Component Value Date   CHOL 187 03/17/2014   HDL 59 03/17/2014   LDLCALC 143 02/14/2015   TRIG 108 03/17/2014   CHOLHDL 3.2 03/17/2014        CKD (chronic kidney disease) stage 3, GFR 30-59 ml/min D/C NSAIDS, add clonidine , rept in 8 weeks       Review of Systems     Objective:   Physical Exam        Assessment & Plan:

## 2015-02-21 NOTE — Assessment & Plan Note (Signed)
Improved, managed by diet only Christina Davenport is reminded of the importance of commitment to daily physical activity for 30 minutes or more, as able and the need to limit carbohydrate intake to 30 to 60 grams per meal to help with blood sugar control.   The need to take medication as prescribed, test blood sugar as directed, and to call between visits if there is a concern that blood sugar is uncontrolled is also discussed.   Christina Davenport is reminded of the importance of daily foot exam, annual eye examination, and good blood sugar, blood pressure and cholesterol control.  Diabetic Labs Latest Ref Rng 02/14/2015 03/17/2014 11/02/2013 10/28/2013 12/30/2011  HbA1c - 6.0 6.1(H) - 6.1(H) 6.8(H)  Microalbumin 0.00 - 1.89 mg/dL - - 1.67 - 6.26(H)  Micro/Creat Ratio 0.0 - 30.0 mg/g - - 9.2 - 14.7  Chol 0 - 200 mg/dL - 187 - 182 -  HDL >39 mg/dL - 59 - 51 -  Calc LDL - 143 106(H) - 118(H) -  Triglycerides <150 mg/dL - 108 - 65 -  Creatinine 0.50 - 1.10 mg/dL - 1.15(H) - - 1.08   BP/Weight 02/21/2015 03/17/2014 11/02/2013 05/03/2013 01/08/2012 [id number]  Systolic BP 0000000 123456 [id number] [id number] Q000111Q Q000111Q [id number]  Diastolic BP 94 80 94 90 90 90 80  Wt. (Lbs) 302.04 311.08 301.8 297.08 289.04 272.4 274  BMI 50.26 51.77 50.22 49.44 48.1 45.33 45.6   Foot/eye exam completion dates Latest Ref Rng 02/21/2015 11/02/2013  Eye Exam No Retinopathy - -  Foot exam Order - - -  Foot Form Completion - Done Done

## 2015-02-21 NOTE — Assessment & Plan Note (Addendum)
Uncontrolleed, non compliant , off of cozaar, start clonidine in place of this DASH diet and commitment to daily physical activity for a minimum of 30 minutes discussed and encouraged, as a part of hypertension management. The importance of attaining a healthy weight is also discussed.  BP/Weight 02/21/2015 03/17/2014 11/02/2013 05/03/2013 01/08/2012 [id number]  Systolic BP 0000000 123456 [id number] Q000111Q Q000111Q [id number]  Diastolic BP 94 80 94 90 90 90 80  Wt. (Lbs) 302.04 311.08 301.8 297.08 289.04 272.4 274  BMI 50.26 51.77 50.22 49.44 48.1 45.33 45.6

## 2015-02-21 NOTE — Assessment & Plan Note (Signed)
Uncontrolled Hyperlipidemia:Low fat diet discussed and encouraged.   Lipid Panel  Lab Results  Component Value Date   CHOL 187 03/17/2014   HDL 59 03/17/2014   LDLCALC 143 02/14/2015   TRIG 108 03/17/2014   CHOLHDL 3.2 03/17/2014

## 2015-03-06 ENCOUNTER — Ambulatory Visit: Payer: BLUE CROSS/BLUE SHIELD | Admitting: Family Medicine

## 2015-03-07 ENCOUNTER — Telehealth: Payer: Self-pay

## 2015-03-07 DIAGNOSIS — E1169 Type 2 diabetes mellitus with other specified complication: Secondary | ICD-10-CM

## 2015-03-07 DIAGNOSIS — E669 Obesity, unspecified: Principal | ICD-10-CM

## 2015-03-07 DIAGNOSIS — Z114 Encounter for screening for human immunodeficiency virus [HIV]: Secondary | ICD-10-CM

## 2015-03-07 DIAGNOSIS — I10 Essential (primary) hypertension: Secondary | ICD-10-CM

## 2015-03-07 NOTE — Telephone Encounter (Signed)
Patient aware. Changed 2/23 visit to a nurse visit and rescheduled ov for the end of March.  Repeat lab order to be given when patient comes in for flood pressure check.  She will also submit microalbumin

## 2015-03-07 NOTE — Telephone Encounter (Signed)
-----  Message from Fayrene Helper, MD sent at 03/03/2015 12:34 PM EST ----- pls explain kidney function not as good as 1 year ago, and I believe this is because of uncontrolled BP, I She needs to keep appt for nurse visit for bP, i will check behind nurse, she needs mD visitr with rept chem 7 and EGFr, non fast and HBA1C  3 to 4  months from her recent visit  ?? pls ask

## 2015-03-11 ENCOUNTER — Other Ambulatory Visit: Payer: Self-pay | Admitting: Family Medicine

## 2015-05-22 ENCOUNTER — Other Ambulatory Visit: Payer: Self-pay

## 2015-05-22 MED ORDER — TRIAMTERENE-HCTZ 37.5-25 MG PO TABS: 1.0000 | ORAL_TABLET | Freq: Every day | ORAL | Status: DC

## 2015-05-23 LAB — BASIC METABOLIC PANEL WITH GFR
BUN: 14 mg/dL (ref 7–25)
CHLORIDE: 99 mmol/L (ref 98–110)
CO2: 26 mmol/L (ref 20–31)
Calcium: 9.1 mg/dL (ref 8.6–10.2)
Creat: 1.32 mg/dL — ABNORMAL HIGH (ref 0.50–1.10)
GFR, Est African American: 57 mL/min — ABNORMAL LOW (ref >=60)
GFR, Est Non African American: 49 mL/min — ABNORMAL LOW (ref >=60)
Glucose, Bld: 114 mg/dL — ABNORMAL HIGH (ref 65–99)
POTASSIUM: 3.9 mmol/L (ref 3.5–5.3)
Sodium: 135 mmol/L (ref 135–146)

## 2015-05-23 LAB — HIV ANTIBODY (ROUTINE TESTING W REFLEX): HIV: NONREACTIVE

## 2015-05-24 LAB — HEMOGLOBIN A1C
Hgb A1c MFr Bld: 6.3 % — ABNORMAL HIGH (ref <5.7)
MEAN PLASMA GLUCOSE: 134 mg/dL

## 2015-05-25 ENCOUNTER — Encounter: Payer: Self-pay | Admitting: Family Medicine

## 2015-05-25 ENCOUNTER — Ambulatory Visit (INDEPENDENT_AMBULATORY_CARE_PROVIDER_SITE_OTHER): Payer: BLUE CROSS/BLUE SHIELD | Admitting: Family Medicine

## 2015-05-25 VITALS — BP 138/82 | HR 100 | Resp 18 | Ht 65.0 in | Wt 303.0 lb

## 2015-05-25 DIAGNOSIS — E119 Type 2 diabetes mellitus without complications: Secondary | ICD-10-CM

## 2015-05-25 DIAGNOSIS — E785 Hyperlipidemia, unspecified: Secondary | ICD-10-CM | POA: Diagnosis not present

## 2015-05-25 DIAGNOSIS — E669 Obesity, unspecified: Secondary | ICD-10-CM

## 2015-05-25 DIAGNOSIS — Z23 Encounter for immunization: Secondary | ICD-10-CM

## 2015-05-25 DIAGNOSIS — I10 Essential (primary) hypertension: Secondary | ICD-10-CM

## 2015-05-25 DIAGNOSIS — E1169 Type 2 diabetes mellitus with other specified complication: Secondary | ICD-10-CM

## 2015-05-25 NOTE — Patient Instructions (Signed)
F/u in September, call if you need me before  INCREASE clonidine , take the o.2mg  tab daily, continue other meds  Better kidney function and BP  All the best with surgery  TdAP today  Fasting labs for September

## 2015-05-27 LAB — MICROALBUMIN / CREATININE URINE RATIO
CREATININE, URINE: 83 mg/dL (ref 20–320)
MICROALB/CREAT RATIO: 13 ug/mg{creat} (ref <30)
Microalb, Ur: 1.1 mg/dL

## 2015-05-28 NOTE — Progress Notes (Signed)
Subjective:    Patient ID: Christina Davenport, female    DOB: [birth year], 45 y.o.   MRN: [id number]  HPI   Christina Davenport     MRN: [id number]      DOB: 06/27/1970   HPI Ms. Rackliff is here for follow up and re-evaluation of chronic medical conditions, medication management and review of any available recent lab and radiology data.  Preventive health is updated, specifically  Cancer screening and Immunization.   Questions or concerns regarding consultations or procedures which the PT has had in the interim are  Addressed.Has gyne surgery planned in May for possible unilateral oophorectomy, has had large cyst on one ovary for years The PT denies any adverse reactions to current medications since the last visit. Has not been taking prescribed dose of clonidone, was finishing tablets she already had  There are no specific complaints   ROS Denies recent fever or chills. Denies sinus pressure, nasal congestion, ear pain or sore throat. Denies chest congestion, productive cough or wheezing. Denies chest pains, palpitations and leg swelling Denies abdominal pain, nausea, vomiting,diarrhea or constipation.   Denies dysuria, frequency, hesitancy or incontinence. Denies joint pain, swelling and limitation in mobility. Denies headaches, seizures, numbness, or tingling. Denies depression, anxiety or insomnia. Denies skin break down or rash.   PE  BP 138/82 mmHg  Pulse 100  Resp 18  Ht 5\' 5"  (1.651 m)  Wt 303 lb (137.44 kg)  BMI 50.42 kg/m2  SpO2 99%  Patient alert and oriented and in no cardiopulmonary distress.  HEENT: facial asymmetry, EOMI,   oropharynx pink and moist.  Neck supple no JVD, no mass.  Chest: Clear to auscultation bilaterally.  CVS: S1, S2 no murmurs, no S3.Regular rate.  ABD: Soft non tender.   Ext: No edema  MS: Adequate ROM spine, shoulders, hips and knees.  Skin: Intact, no ulcerations or rash noted.  Psych: Good eye contact, normal affect. Memory  intact not anxious or depressed appearing.  CNS: CN 2-12 intact, power,  normal throughout.no focal deficits noted.   Assessment & Plan  Essential hypertension Controlled, currently taking 1.5 mg clonidine, she is to start script for .2mg  dose , which is prescribed  DASH diet and commitment to daily physical activity for a minimum of 30 minutes discussed and encouraged, as a part of hypertension management. The importance of attaining a healthy weight is also discussed.  BP/Weight 05/25/2015 02/21/2015 03/17/2014 11/02/2013 05/03/2013 01/08/2012 [id number]  Systolic BP 0000000 0000000 123456 [id number] [id number] Q000111Q Q000111Q  Diastolic BP 82 94 80 94 90 90 90  Wt. (Lbs) 303 302.04 311.08 301.8 297.08 289.04 272.4  BMI 50.42 50.26 51.77 50.22 49.44 48.1 45.33        Diabetes mellitus type 2 in obese [facility] [place]) Ms. Vanderjagt is reminded of the importance of commitment to daily physical activity for 30 minutes or more, as able and the need to limit carbohydrate intake to 30 to 60 grams per meal to help with blood sugar control.   The need to take medication as prescribed, test blood sugar as directed, and to call between visits if there is a concern that blood sugar is uncontrolled is also discussed.   Ms. Guba is reminded of the importance of daily foot exam, annual eye examination, and good blood sugar, blood pressure and cholesterol control. deteriorated  Diabetic Labs Latest Ref Rng 05/25/2015 05/23/2015 02/14/2015 02/14/2015 03/17/2014  HbA1c <5.7 % - 6.3(H) 6.0(H) 6.0 6.1(H)  Microalbumin Not estab mg/dL 1.1 - - - -  Micro/Creat Ratio <30 mcg/mg creat 13 - - - -  Chol 125 - 200 mg/dL - - 204(H) - 187  HDL >=46 mg/dL - - 44(L) - 59  Calc LDL <130 mg/dL - - 143(H) 143 106(H)  Triglycerides <150 mg/dL - - 85 - 108  Creatinine 0.50 - 1.10 mg/dL - 1.32(H) 1.63(H) - 1.15(H)   BP/Weight 05/25/2015 02/21/2015 03/17/2014 11/02/2013 05/03/2013 01/08/2012 [id number]  Systolic BP 0000000 0000000 123456 [id number] [id number] Q000111Q Q000111Q  Diastolic BP 82 94 80  94 90 90 90  Wt. (Lbs) 303 302.04 311.08 301.8 297.08 289.04 272.4  BMI 50.42 50.26 51.77 50.22 49.44 48.1 45.33   Foot/eye exam completion dates Latest Ref Rng 02/21/2015 11/02/2013  Eye Exam No Retinopathy - -  Foot exam Order - - -  Foot Form Completion - Done Done         Hyperlipidemia LDL goal <100 Hyperlipidemia:Low fat diet discussed and encouraged.   Lipid Panel  Lab Results  Component Value Date   CHOL 204* 02/14/2015   HDL 44* 02/14/2015   LDLCALC 143* 02/14/2015   TRIG 85 02/14/2015   CHOLHDL 4.6 02/14/2015   Uncontrolled Updated lab needed at/ before next visit.      Morbid obesity Deteriorated. Patient re-educated about  the importance of commitment to a  minimum of 150 minutes of exercise per week.  The importance of healthy food choices with portion control discussed. Encouraged to start a food diary, count calories and to consider  joining a support group. Sample diet sheets offered. Goals set by the patient for the next several months.   Weight /BMI 05/25/2015 02/21/2015 03/17/2014  WEIGHT 303 lb 302 lb 0.6 oz 311 lb 1.3 oz  HEIGHT 5\' 5"  5\' 5"  5\' 5"   BMI 50.42 kg/m2 50.26 kg/m2 51.77 kg/m2    Current exercise per week 60  minutes.       Review of Systems     Objective:   Physical Exam        Assessment & Plan:

## 2015-05-28 NOTE — Assessment & Plan Note (Signed)
Hyperlipidemia:Low fat diet discussed and encouraged.   Lipid Panel  Lab Results  Component Value Date   CHOL 204* 02/14/2015   HDL 44* 02/14/2015   LDLCALC 143* 02/14/2015   TRIG 85 02/14/2015   CHOLHDL 4.6 02/14/2015   Uncontrolled Updated lab needed at/ before next visit.

## 2015-05-28 NOTE — Assessment & Plan Note (Addendum)
Controlled, currently taking 1.5 mg clonidine, she is to start script for .2mg  dose , which is prescribed  DASH diet and commitment to daily physical activity for a minimum of 30 minutes discussed and encouraged, as a part of hypertension management. The importance of attaining a healthy weight is also discussed.  BP/Weight 05/25/2015 02/21/2015 03/17/2014 11/02/2013 05/03/2013 01/08/2012 [id number]  Systolic BP 0000000 0000000 123456 [id number] [id number] Q000111Q Q000111Q  Diastolic BP 82 94 80 94 90 90 90  Wt. (Lbs) 303 302.04 311.08 301.8 297.08 289.04 272.4  BMI 50.42 50.26 51.77 50.22 49.44 48.1 45.33

## 2015-05-28 NOTE — Assessment & Plan Note (Signed)
Deteriorated. Patient re-educated about  the importance of commitment to a  minimum of 150 minutes of exercise per week.  The importance of healthy food choices with portion control discussed. Encouraged to start a food diary, count calories and to consider  joining a support group. Sample diet sheets offered. Goals set by the patient for the next several months.   Weight /BMI 05/25/2015 02/21/2015 03/17/2014  WEIGHT 303 lb 302 lb 0.6 oz 311 lb 1.3 oz  HEIGHT 5\' 5"  5\' 5"  5\' 5"   BMI 50.42 kg/m2 50.26 kg/m2 51.77 kg/m2    Current exercise per week 60  minutes.

## 2015-05-28 NOTE — Assessment & Plan Note (Signed)
Christina Davenport is reminded of the importance of commitment to daily physical activity for 30 minutes or more, as able and the need to limit carbohydrate intake to 30 to 60 grams per meal to help with blood sugar control.   The need to take medication as prescribed, test blood sugar as directed, and to call between visits if there is a concern that blood sugar is uncontrolled is also discussed.   Christina Davenport is reminded of the importance of daily foot exam, annual eye examination, and good blood sugar, blood pressure and cholesterol control. deteriorated  Diabetic Labs Latest Ref Rng 05/25/2015 05/23/2015 02/14/2015 02/14/2015 03/17/2014  HbA1c <5.7 % - 6.3(H) 6.0(H) 6.0 6.1(H)  Microalbumin Not estab mg/dL 1.1 - - - -  Micro/Creat Ratio <30 mcg/mg creat 13 - - - -  Chol 125 - 200 mg/dL - - 204(H) - 187  HDL >=46 mg/dL - - 44(L) - 59  Calc LDL <130 mg/dL - - 143(H) 143 106(H)  Triglycerides <150 mg/dL - - 85 - 108  Creatinine 0.50 - 1.10 mg/dL - 1.32(H) 1.63(H) - 1.15(H)   BP/Weight 05/25/2015 02/21/2015 03/17/2014 11/02/2013 05/03/2013 01/08/2012 [id number]  Systolic BP 0000000 0000000 123456 [id number] A999333 Q000111Q Q000111Q  Diastolic BP 82 94 80 94 90 90 90  Wt. (Lbs) 303 302.04 311.08 301.8 297.08 289.04 272.4  BMI 50.42 50.26 51.77 50.22 49.44 48.1 45.33   Foot/eye exam completion dates Latest Ref Rng 02/21/2015 11/02/2013  Eye Exam No Retinopathy - -  Foot exam Order - - -  Foot Form Completion - Done Done

## 2015-06-21 ENCOUNTER — Other Ambulatory Visit: Payer: Self-pay | Admitting: Obstetrics and Gynecology

## 2015-06-30 NOTE — Patient Instructions (Addendum)
Your procedure is scheduled on:  Friday, Jul 07, 2015  Enter through the Main Entrance of [facility] at: 6:00 AM  Pick up the phone at the desk and dial [phone].  Call this number if you have problems the morning of surgery: [phone].  Remember: Do NOT eat food or drink after:  Midnight Thursday  Take these medicines the morning of surgery with a SIP OF WATER:  Amlodipine, Triamterene,  Do NOT wear jewelry (body piercing), metal hair clips/bobby pins, make-up, or nail polish. Do NOT wear lotions, powders, or perfumes.  You may wear deodorant. Do NOT shave for 48 hours prior to surgery. Do NOT bring valuables to the hospital. Contacts, dentures, or bridgework may not be worn into surgery.  Leave suitcase in car.  After surgery it may be brought to your room.  For patients admitted to the hospital, checkout time is 11:00 AM the day of discharge.

## 2015-07-03 ENCOUNTER — Encounter (HOSPITAL_COMMUNITY)
Admission: RE | Admit: 2015-07-03 | Discharge: 2015-07-03 | Disposition: A | Discharge status: Home / Self Care | Payer: BLUE CROSS/BLUE SHIELD | Source: Ambulatory Visit | Attending: Obstetrics and Gynecology | Admitting: Obstetrics and Gynecology

## 2015-07-03 ENCOUNTER — Encounter (HOSPITAL_COMMUNITY): Payer: Self-pay

## 2015-07-03 ENCOUNTER — Other Ambulatory Visit: Payer: Self-pay

## 2015-07-03 DIAGNOSIS — Z01818 Encounter for other preprocedural examination: Secondary | ICD-10-CM | POA: Insufficient documentation

## 2015-07-03 LAB — BASIC METABOLIC PANEL
Anion gap: 9 (ref 5–15)
BUN: 22 mg/dL — AB (ref 6–20)
CALCIUM: 9.4 mg/dL (ref 8.9–10.3)
CO2: 25 mmol/L (ref 22–32)
CREATININE: 1.51 mg/dL — AB (ref 0.44–1.00)
Chloride: 102 mmol/L (ref 101–111)
GFR calc Af Amer: 48 mL/min — ABNORMAL LOW (ref >60)
GFR, EST NON AFRICAN AMERICAN: 41 mL/min — AB (ref >60)
GLUCOSE: 98 mg/dL (ref 65–99)
POTASSIUM: 4.1 mmol/L (ref 3.5–5.1)
SODIUM: 136 mmol/L (ref 135–145)

## 2015-07-03 LAB — CBC
HEMATOCRIT: 35 % — AB (ref 36.0–46.0)
Hemoglobin: 11.5 g/dL — ABNORMAL LOW (ref 12.0–15.0)
MCH: 26.2 pg (ref 26.0–34.0)
MCHC: 32.9 g/dL (ref 30.0–36.0)
MCV: 79.7 fL (ref 78.0–100.0)
PLATELETS: 345 10*3/uL (ref 150–400)
RBC: 4.39 MIL/uL (ref 3.87–5.11)
RDW: 14.2 % (ref 11.5–15.5)
WBC: 7.6 10*3/uL (ref 4.0–10.5)

## 2015-07-03 NOTE — Pre-Procedure Instructions (Signed)
Dr. Delma Post reviewed Christina Davenport' EKG, no new orders received at this time.

## 2015-07-06 ENCOUNTER — Encounter (HOSPITAL_COMMUNITY): Payer: Self-pay | Admitting: Anesthesiology

## 2015-07-06 MED ORDER — DEXTROSE 5 % IV SOLN
3.0000 g | INTRAVENOUS | Status: AC
  Administered 2015-07-07: 3 g via INTRAVENOUS
  Filled 2015-07-06: qty 3000

## 2015-07-06 NOTE — Anesthesia Preprocedure Evaluation (Addendum)
Anesthesia Evaluation  Patient identified by MRN, date of birth, ID band  Reviewed: Allergy & Precautions, H&P , NPO status , Patient's Chart, lab work & pertinent test results  Airway Mallampati: III  TM Distance: >3 FB Neck ROM: full    Dental no notable dental hx. (+) Teeth Intact   Pulmonary neg pulmonary ROS,    Pulmonary exam normal        Cardiovascular hypertension, Pt. on medications Normal cardiovascular exam     Neuro/Psych negative neurological ROS  negative psych ROS   GI/Hepatic negative GI ROS, Neg liver ROS,   Endo/Other  diabetes, Type 2Morbid obesity  Renal/GU negative Renal ROS     Musculoskeletal   Abdominal (+) + obese,   Peds  Hematology negative hematology ROS (+)   Anesthesia Other Findings   Reproductive/Obstetrics negative OB ROS                            Anesthesia Physical Anesthesia Plan  ASA: III  Anesthesia Plan: General   Post-op Pain Management:    Induction: Intravenous  Airway Management Planned: Oral ETT  Additional Equipment:   Intra-op Plan:   Post-operative Plan: Extubation in OR  Informed Consent: I have reviewed the patients History and Physical, chart, labs and discussed the procedure including the risks, benefits and alternatives for the proposed anesthesia with the patient or authorized representative who has indicated his/her understanding and acceptance.     Plan Discussed with: CRNA and Surgeon  Anesthesia Plan Comments:        Anesthesia Quick Evaluation

## 2015-07-07 ENCOUNTER — Inpatient Hospital Stay (HOSPITAL_COMMUNITY): Payer: BLUE CROSS/BLUE SHIELD | Admitting: Anesthesiology

## 2015-07-07 ENCOUNTER — Inpatient Hospital Stay (HOSPITAL_COMMUNITY)
Admission: RE | Admit: 2015-07-07 | Discharge: 2015-07-08 | DRG: 742 | Disposition: A | Discharge status: Home / Self Care | Payer: BLUE CROSS/BLUE SHIELD | Source: Ambulatory Visit | Attending: Obstetrics and Gynecology | Admitting: Obstetrics and Gynecology

## 2015-07-07 ENCOUNTER — Encounter (HOSPITAL_COMMUNITY)
Admission: RE | Disposition: A | Discharge status: Home / Self Care | Payer: Self-pay | Source: Ambulatory Visit | Attending: Obstetrics and Gynecology

## 2015-07-07 DIAGNOSIS — I10 Essential (primary) hypertension: Secondary | ICD-10-CM | POA: Diagnosis present

## 2015-07-07 DIAGNOSIS — N838 Other noninflammatory disorders of ovary, fallopian tube and broad ligament: Secondary | ICD-10-CM | POA: Diagnosis present

## 2015-07-07 DIAGNOSIS — Z6841 Body Mass Index (BMI) 40.0 and over, adult: Secondary | ICD-10-CM

## 2015-07-07 DIAGNOSIS — N183 Chronic kidney disease, stage 3 unspecified: Secondary | ICD-10-CM

## 2015-07-07 DIAGNOSIS — Z90721 Acquired absence of ovaries, unilateral: Secondary | ICD-10-CM

## 2015-07-07 DIAGNOSIS — R29810 Facial weakness: Secondary | ICD-10-CM | POA: Diagnosis present

## 2015-07-07 DIAGNOSIS — E669 Obesity, unspecified: Secondary | ICD-10-CM

## 2015-07-07 DIAGNOSIS — Z833 Family history of diabetes mellitus: Secondary | ICD-10-CM

## 2015-07-07 DIAGNOSIS — Z888 Allergy status to other drugs, medicaments and biological substances status: Secondary | ICD-10-CM | POA: Diagnosis not present

## 2015-07-07 DIAGNOSIS — D270 Benign neoplasm of right ovary: Secondary | ICD-10-CM | POA: Diagnosis present

## 2015-07-07 DIAGNOSIS — D271 Benign neoplasm of left ovary: Secondary | ICD-10-CM | POA: Diagnosis present

## 2015-07-07 DIAGNOSIS — Z8249 Family history of ischemic heart disease and other diseases of the circulatory system: Secondary | ICD-10-CM

## 2015-07-07 DIAGNOSIS — E119 Type 2 diabetes mellitus without complications: Secondary | ICD-10-CM | POA: Diagnosis present

## 2015-07-07 DIAGNOSIS — E1169 Type 2 diabetes mellitus with other specified complication: Secondary | ICD-10-CM

## 2015-07-07 HISTORY — DX: Acquired absence of ovaries, unilateral: Z90.721

## 2015-07-07 HISTORY — PX: SALPINGOOPHORECTOMY: SHX82

## 2015-07-07 HISTORY — PX: LAPAROTOMY: SHX154

## 2015-07-07 LAB — GLUCOSE, CAPILLARY
Glucose-Capillary: 137 mg/dL — ABNORMAL HIGH (ref 65–99)
Glucose-Capillary: 107 mg/dL — ABNORMAL HIGH (ref 65–99)

## 2015-07-07 LAB — PREGNANCY, URINE: Preg Test, Ur: NEGATIVE

## 2015-07-07 SURGERY — LAPAROTOMY, EXPLORATORY
Anesthesia: General | Site: Abdomen | Laterality: Right

## 2015-07-07 MED ORDER — HYDROMORPHONE HCL 1 MG/ML IJ SOLN
0.2500 mg | INTRAMUSCULAR | Status: DC | PRN
  Administered 2015-07-07: 0.5 mg via INTRAVENOUS
  Administered 2015-07-07 (×2): 0.25 mg via INTRAVENOUS

## 2015-07-07 MED ORDER — PANTOPRAZOLE SODIUM 40 MG PO TBEC
40.0000 mg | DELAYED_RELEASE_TABLET | Freq: Every day | ORAL | Status: DC
  Administered 2015-07-07 – 2015-07-08 (×2): 40 mg via ORAL
  Filled 2015-07-07 (×2): qty 1

## 2015-07-07 MED ORDER — SCOPOLAMINE 1 MG/3DAYS TD PT72: 1.0000 | MEDICATED_PATCH | Freq: Once | TRANSDERMAL | Status: DC

## 2015-07-07 MED ORDER — OXYCODONE-ACETAMINOPHEN 5-325 MG PO TABS
1.0000 | ORAL_TABLET | ORAL | Status: DC | PRN
  Administered 2015-07-08: 1 via ORAL
  Filled 2015-07-07 (×2): qty 2

## 2015-07-07 MED ORDER — ATORVASTATIN CALCIUM 10 MG PO TABS
10.0000 mg | ORAL_TABLET | Freq: Every day | ORAL | Status: DC
  Administered 2015-07-07 – 2015-07-08 (×2): 10 mg via ORAL
  Filled 2015-07-07 (×2): qty 1

## 2015-07-07 MED ORDER — HYDROMORPHONE HCL 1 MG/ML IJ SOLN
INTRAMUSCULAR | Status: AC
  Filled 2015-07-07: qty 1

## 2015-07-07 MED ORDER — NALOXONE HCL 0.4 MG/ML IJ SOLN: 0.4000 mg | INTRAMUSCULAR | Status: DC | PRN

## 2015-07-07 MED ORDER — ONDANSETRON HCL 4 MG/2ML IJ SOLN: 4.0000 mg | Freq: Once | INTRAMUSCULAR | Status: DC | PRN

## 2015-07-07 MED ORDER — SIMETHICONE 80 MG PO CHEW
80.0000 mg | CHEWABLE_TABLET | Freq: Four times a day (QID) | ORAL | Status: DC | PRN
  Administered 2015-07-08: 80 mg via ORAL
  Filled 2015-07-07: qty 1

## 2015-07-07 MED ORDER — ONDANSETRON HCL 4 MG/2ML IJ SOLN: 4.0000 mg | Freq: Four times a day (QID) | INTRAMUSCULAR | Status: DC | PRN

## 2015-07-07 MED ORDER — NEOSTIGMINE METHYLSULFATE 10 MG/10ML IV SOLN
INTRAVENOUS | Status: DC | PRN
  Administered 2015-07-07: 5 mg via INTRAVENOUS

## 2015-07-07 MED ORDER — MIDAZOLAM HCL 5 MG/5ML IJ SOLN
INTRAMUSCULAR | Status: DC | PRN
Start: 2015-07-07 — End: 2015-07-07
  Administered 2015-07-07: 2 mg via INTRAVENOUS

## 2015-07-07 MED ORDER — HEPARIN SODIUM (PORCINE) 5000 UNIT/ML IJ SOLN
INTRAMUSCULAR | Status: AC
  Filled 2015-07-07: qty 1

## 2015-07-07 MED ORDER — MENTHOL 3 MG MT LOZG: 1.0000 | LOZENGE | OROMUCOSAL | Status: DC | PRN

## 2015-07-07 MED ORDER — PROPOFOL 10 MG/ML IV BOLUS
INTRAVENOUS | Status: DC | PRN
  Administered 2015-07-07: 200 mg via INTRAVENOUS

## 2015-07-07 MED ORDER — HYDROMORPHONE HCL 1 MG/ML IJ SOLN: 0.2000 mg | INTRAMUSCULAR | Status: DC | PRN

## 2015-07-07 MED ORDER — KETOROLAC TROMETHAMINE 30 MG/ML IJ SOLN
30.0000 mg | Freq: Once | INTRAMUSCULAR | Status: DC
Start: 2015-07-07 — End: 2015-07-07

## 2015-07-07 MED ORDER — PROPOFOL 10 MG/ML IV BOLUS
INTRAVENOUS | Status: AC
  Filled 2015-07-07: qty 20

## 2015-07-07 MED ORDER — CLONIDINE HCL 0.2 MG PO TABS
0.2000 mg | ORAL_TABLET | Freq: Every day | ORAL | Status: DC
  Administered 2015-07-07: 0.2 mg via ORAL
  Filled 2015-07-07: qty 1

## 2015-07-07 MED ORDER — LIDOCAINE HCL (CARDIAC) 20 MG/ML IV SOLN
INTRAVENOUS | Status: AC
  Filled 2015-07-07: qty 5

## 2015-07-07 MED ORDER — NEOSTIGMINE METHYLSULFATE 10 MG/10ML IV SOLN
INTRAVENOUS | Status: AC
  Filled 2015-07-07: qty 1

## 2015-07-07 MED ORDER — MIDAZOLAM HCL 2 MG/2ML IJ SOLN
INTRAMUSCULAR | Status: AC
Start: 2015-07-07 — End: 2015-07-07
  Filled 2015-07-07: qty 2

## 2015-07-07 MED ORDER — AMLODIPINE BESYLATE 10 MG PO TABS
10.0000 mg | ORAL_TABLET | Freq: Every day | ORAL | Status: DC
  Administered 2015-07-08: 10 mg via ORAL
  Filled 2015-07-07: qty 1

## 2015-07-07 MED ORDER — SODIUM CHLORIDE 0.9% FLUSH: 9.0000 mL | INTRAVENOUS | Status: DC | PRN

## 2015-07-07 MED ORDER — HYDROMORPHONE HCL 1 MG/ML IJ SOLN
INTRAMUSCULAR | Status: DC | PRN
  Administered 2015-07-07 (×2): 0.5 mg via INTRAVENOUS

## 2015-07-07 MED ORDER — BUPIVACAINE HCL (PF) 0.25 % IJ SOLN
INTRAMUSCULAR | Status: DC | PRN
  Administered 2015-07-07: 9 mL

## 2015-07-07 MED ORDER — FENTANYL CITRATE (PF) 250 MCG/5ML IJ SOLN
INTRAMUSCULAR | Status: AC
  Filled 2015-07-07: qty 5

## 2015-07-07 MED ORDER — GLYCOPYRROLATE 0.2 MG/ML IJ SOLN
INTRAMUSCULAR | Status: DC | PRN
  Administered 2015-07-07: .8 mg via INTRAVENOUS

## 2015-07-07 MED ORDER — SCOPOLAMINE 1 MG/3DAYS TD PT72
MEDICATED_PATCH | TRANSDERMAL | Status: AC
  Filled 2015-07-07: qty 1

## 2015-07-07 MED ORDER — DIPHENHYDRAMINE HCL 50 MG/ML IJ SOLN: 12.5000 mg | Freq: Four times a day (QID) | INTRAMUSCULAR | Status: DC | PRN

## 2015-07-07 MED ORDER — ONDANSETRON HCL 4 MG/2ML IJ SOLN
INTRAMUSCULAR | Status: DC | PRN
  Administered 2015-07-07: 4 mg via INTRAVENOUS

## 2015-07-07 MED ORDER — IBUPROFEN 800 MG PO TABS: 800.0000 mg | ORAL_TABLET | Freq: Three times a day (TID) | ORAL | Status: DC | PRN

## 2015-07-07 MED ORDER — FENTANYL CITRATE (PF) 100 MCG/2ML IJ SOLN
INTRAMUSCULAR | Status: DC | PRN
  Administered 2015-07-07 (×3): 50 ug via INTRAVENOUS
  Administered 2015-07-07: 100 ug via INTRAVENOUS

## 2015-07-07 MED ORDER — GLYCOPYRROLATE 0.2 MG/ML IJ SOLN
INTRAMUSCULAR | Status: AC
  Filled 2015-07-07: qty 4

## 2015-07-07 MED ORDER — ROCURONIUM BROMIDE 100 MG/10ML IV SOLN
INTRAVENOUS | Status: DC | PRN
  Administered 2015-07-07: 50 mg via INTRAVENOUS
  Administered 2015-07-07: 10 mg via INTRAVENOUS

## 2015-07-07 MED ORDER — TRIAMTERENE-HCTZ 37.5-25 MG PO TABS
1.0000 | ORAL_TABLET | Freq: Every day | ORAL | Status: DC
  Administered 2015-07-08: 1 via ORAL
  Filled 2015-07-07: qty 1

## 2015-07-07 MED ORDER — ONDANSETRON HCL 4 MG PO TABS: 4.0000 mg | ORAL_TABLET | Freq: Four times a day (QID) | ORAL | Status: DC | PRN

## 2015-07-07 MED ORDER — DEXTROSE IN LACTATED RINGERS 5 % IV SOLN
INTRAVENOUS | Status: DC
Start: 2015-07-07 — End: 2015-07-08
  Administered 2015-07-07: 16:00:00 via INTRAVENOUS

## 2015-07-07 MED ORDER — HYDROMORPHONE 1 MG/ML IV SOLN
INTRAVENOUS | Status: DC
  Administered 2015-07-07: 1 mg via INTRAVENOUS
  Administered 2015-07-07: 13:00:00 via INTRAVENOUS
  Filled 2015-07-07: qty 25

## 2015-07-07 MED ORDER — LIDOCAINE HCL (CARDIAC) 20 MG/ML IV SOLN
INTRAVENOUS | Status: DC | PRN
  Administered 2015-07-07: 50 mg via INTRAVENOUS

## 2015-07-07 MED ORDER — MEPERIDINE HCL 25 MG/ML IJ SOLN: 6.2500 mg | INTRAMUSCULAR | Status: DC | PRN

## 2015-07-07 MED ORDER — DEXAMETHASONE SODIUM PHOSPHATE 10 MG/ML IJ SOLN
INTRAMUSCULAR | Status: AC
  Filled 2015-07-07: qty 1

## 2015-07-07 MED ORDER — BUPIVACAINE HCL (PF) 0.25 % IJ SOLN
INTRAMUSCULAR | Status: AC
  Filled 2015-07-07: qty 30

## 2015-07-07 MED ORDER — LACTATED RINGERS IV SOLN
INTRAVENOUS | Status: DC
  Administered 2015-07-07 (×2): via INTRAVENOUS

## 2015-07-07 MED ORDER — DEXAMETHASONE SODIUM PHOSPHATE 10 MG/ML IJ SOLN
INTRAMUSCULAR | Status: DC | PRN
  Administered 2015-07-07: 4 mg via INTRAVENOUS

## 2015-07-07 MED ORDER — DIPHENHYDRAMINE HCL 12.5 MG/5ML PO ELIX: 12.5000 mg | ORAL_SOLUTION | Freq: Four times a day (QID) | ORAL | Status: DC | PRN

## 2015-07-07 MED ORDER — ONDANSETRON HCL 4 MG/2ML IJ SOLN
INTRAMUSCULAR | Status: AC
  Filled 2015-07-07: qty 2

## 2015-07-07 SURGICAL SUPPLY — 46 items
APL SKNCLS STERI-STRIP NONHPOA (GAUZE/BANDAGES/DRESSINGS) ×2
BARRIER ADHS 3X4 INTERCEED (GAUZE/BANDAGES/DRESSINGS) ×1 IMPLANT
BENZOIN TINCTURE PRP APPL 2/3 (GAUZE/BANDAGES/DRESSINGS) ×3 IMPLANT
BLADE SURG 10 STRL SS (BLADE) ×6 IMPLANT
BLADE SURG 15 STRL LF C SS BP (BLADE) IMPLANT
BLADE SURG 15 STRL SS (BLADE) ×3
BRR ADH 4X3 ABS CNTRL BYND (GAUZE/BANDAGES/DRESSINGS) ×2
CANISTER SUCT 3000ML (MISCELLANEOUS) ×3 IMPLANT
CANISTERS HI-FLOW 3000CC (CANNISTER) ×2 IMPLANT
CLOTH BEACON ORANGE TIMEOUT ST (SAFETY) ×3 IMPLANT
CONT PATH 16OZ SNAP LID 3702 (MISCELLANEOUS) ×3 IMPLANT
DECANTER SPIKE VIAL GLASS SM (MISCELLANEOUS) ×1 IMPLANT
DRAPE WARM FLUID 44X44 (DRAPE) ×1 IMPLANT
DRSG OPSITE POSTOP 4X10 (GAUZE/BANDAGES/DRESSINGS) ×1 IMPLANT
GAUZE SPONGE 4X4 16PLY XRAY LF (GAUZE/BANDAGES/DRESSINGS) ×1 IMPLANT
GLOVE BIOGEL PI IND STRL 7.0 (GLOVE) ×4 IMPLANT
GLOVE BIOGEL PI INDICATOR 7.0 (GLOVE) ×2
GLOVE ECLIPSE 6.5 STRL STRAW (GLOVE) ×3 IMPLANT
GOWN STRL REUS W/TWL LRG LVL3 (GOWN DISPOSABLE) ×9 IMPLANT
NEEDLE HYPO 22GX1.5 SAFETY (NEEDLE) ×1 IMPLANT
NS IRRIG 1000ML POUR BTL (IV SOLUTION) ×3 IMPLANT
PACK ABDOMINAL GYN (CUSTOM PROCEDURE TRAY) ×3 IMPLANT
PAD OB MATERNITY 4.3X12.25 (PERSONAL CARE ITEMS) ×3 IMPLANT
PROTECTOR NERVE ULNAR (MISCELLANEOUS) ×3 IMPLANT
RETRACTOR TRAXI PANNICULUS (MISCELLANEOUS) IMPLANT
RETRACTOR WND ALEXIS 25 LRG (MISCELLANEOUS) IMPLANT
RTRCTR WOUND ALEXIS 25CM LRG (MISCELLANEOUS) ×3
SPONGE LAP 18X18 X RAY DECT (DISPOSABLE) ×6 IMPLANT
STAPLER VISISTAT 35W (STAPLE) ×4 IMPLANT
STRIP CLOSURE SKIN 1/2X4 (GAUZE/BANDAGES/DRESSINGS) ×3 IMPLANT
SUT PLAIN 2 0 XLH (SUTURE) ×2 IMPLANT
SUT PROLENE 0 CT 1 30 (SUTURE) IMPLANT
SUT VIC AB 0 CT1 18XCR BRD8 (SUTURE) IMPLANT
SUT VIC AB 0 CT1 36 (SUTURE) ×6 IMPLANT
SUT VIC AB 0 CT1 8-18 (SUTURE) ×3
SUT VIC AB 2-0 CT1 27 (SUTURE) ×3
SUT VIC AB 2-0 CT1 TAPERPNT 27 (SUTURE) IMPLANT
SUT VIC AB 3-0 SH 27 (SUTURE) ×6
SUT VIC AB 3-0 SH 27X BRD (SUTURE) IMPLANT
SUT VICRYL 0 TIES 12 18 (SUTURE) ×1 IMPLANT
SYR BULB IRRIGATION 50ML (SYRINGE) ×1 IMPLANT
SYR CONTROL 10ML LL (SYRINGE) ×1 IMPLANT
TOWEL OR 17X24 6PK STRL BLUE (TOWEL DISPOSABLE) ×6 IMPLANT
TRAXI PANNICULUS RETRACTOR (MISCELLANEOUS) ×1
TRAY FOLEY CATH SILVER 14FR (SET/KITS/TRAYS/PACK) ×3 IMPLANT
WATER STERILE IRR 1000ML POUR (IV SOLUTION) ×3 IMPLANT

## 2015-07-07 NOTE — Anesthesia Postprocedure Evaluation (Signed)
Anesthesia Post Note  Patient: Christina Davenport  Procedure(s) Performed: Procedure(s) (LRB): EXPLORATORY LAPAROTOMY  LEFT OVARIAN CYSTECTOMY, LEFT PARATUBAL CYSTECTOMY (Bilateral) RIGHT SALPINGO OOPHORECTOMY (Right)  Patient location during evaluation: [facility] Anesthesia Type: General Level of consciousness: awake, awake and alert, oriented and confused Pain management: pain level controlled Vital Signs Assessment: post-procedure vital signs reviewed and stable Respiratory status: nonlabored ventilation, spontaneous breathing and respiratory function stable Cardiovascular status: stable Postop Assessment: no headache, no backache, no signs of nausea or vomiting and patient able to bend at knees Anesthetic complications: no     Last Vitals:  Filed Vitals:   07/07/15 1045 07/07/15 1145  BP: 143/77 129/65  Pulse: 88 79  Temp:    Resp:  16    Last Pain:  Filed Vitals:   07/07/15 1528  PainSc: 0-No pain   Pain Goal: Patients Stated Pain Goal: 4 (07/07/15 0558)               Jonh Mcqueary L

## 2015-07-07 NOTE — Transfer of Care (Signed)
Immediate Anesthesia Transfer of Care Note  Patient: Christina Davenport  Procedure(s) Performed: Procedure(s): EXPLORATORY LAPAROTOMY  LEFT OVARIAN CYSTECTOMY, LEFT PARATUBAL CYSTECTOMY (Bilateral) RIGHT SALPINGO OOPHORECTOMY (Right)  Patient Location: PACU  Anesthesia Type:General  Level of Consciousness: sedated  Airway & Oxygen Therapy: Patient Spontanous Breathing and Patient connected to nasal cannula oxygen  Post-op Assessment: Report given to RN and Post -op Vital signs reviewed and stable  Post vital signs: stable  Last Vitals:  Filed Vitals:   07/07/15 0558 07/07/15 0602  BP:  148/86  Pulse: 114   Temp: 37 C   Resp: 20     Last Pain: There were no vitals filed for this visit.    Patients Stated Pain Goal: 4 ([id number])  Complications: No apparent anesthesia complications

## 2015-07-07 NOTE — Anesthesia Procedure Notes (Signed)
Procedure Name: Intubation Date/Time: 07/07/2015 7:31 AM Performed by: Ignacia Bayley Pre-anesthesia Checklist: Patient identified, Emergency Drugs available, Suction available and Patient being monitored Patient Re-evaluated:Patient Re-evaluated prior to inductionOxygen Delivery Method: Circle system utilized Preoxygenation: Pre-oxygenation with 100% oxygen Intubation Type: IV induction Ventilation: Oral airway inserted - appropriate to patient size and Mask ventilation without difficulty Laryngoscope Size: Miller and 2 Grade View: Grade I Tube type: Oral Tube size: 7.0 mm Number of attempts: 1 Airway Equipment and Method: Stylet Placement Confirmation: ETT inserted through vocal cords under direct vision,  positive ETCO2 and breath sounds checked- equal and bilateral Secured at: 21 cm Dental Injury: Teeth and Oropharynx as per pre-operative assessment

## 2015-07-07 NOTE — Brief Op Note (Signed)
07/07/2015  9:10 AM  PATIENT:  Christina Davenport  45 y.o. female  PRE-OPERATIVE DIAGNOSIS:  Large Dermoid Cyst  POST-OPERATIVE DIAGNOSIS:  Bilateral ovarian cysts, left paratubal cyst  PROCEDURE:  Procedure(s): EXPLORATORY LAPAROTOMY  LEFT OVARIAN CYSTECTOMY, LEFT PARATUBAL CYSTECTOMY (Bilateral) RIGHT SALPINGO OOPHORECTOMY (Right)  SURGEON:  Surgeon(s) and Role:    * Servando Salina, MD - Primary  PHYSICIAN ASSISTANT:   ASSISTANTS: Laury Deep, CNM   ANESTHESIA:   general Findings: small uterus, multicystic left ovary, large right ovarian cyst, left paratubal cyst EBL:  Total I/O In: 1200 [I.V.:1200] Out: 370 [Urine:350; Blood:20]  BLOOD ADMINISTERED:none  DRAINS: none   LOCAL MEDICATIONS USED:  MARCAINE     SPECIMEN:  Source of Specimen:  right ovary & tube, left paratubal cyst, left ovarian cysts  DISPOSITION OF SPECIMEN:  PATHOLOGY  COUNTS:  YES  TOURNIQUET:  * No tourniquets in log *  DICTATION: .Other Dictation: Dictation Number [id number]  PLAN OF CARE: Admit to inpatient   PATIENT DISPOSITION:  PACU - hemodynamically stable.   Delay start of Pharmacological VTE agent (>24hrs) due to surgical blood loss or risk of bleeding: no

## 2015-07-08 LAB — CBC
HEMATOCRIT: 31.4 % — AB (ref 36.0–46.0)
Hemoglobin: 10.5 g/dL — ABNORMAL LOW (ref 12.0–15.0)
MCH: 26.6 pg (ref 26.0–34.0)
MCHC: 33.4 g/dL (ref 30.0–36.0)
MCV: 79.5 fL (ref 78.0–100.0)
Platelets: 315 10*3/uL (ref 150–400)
RBC: 3.95 MIL/uL (ref 3.87–5.11)
RDW: 14.2 % (ref 11.5–15.5)
WBC: 8.8 10*3/uL (ref 4.0–10.5)

## 2015-07-08 MED ORDER — OXYCODONE-ACETAMINOPHEN 5-325 MG PO TABS: 1.0000 | ORAL_TABLET | ORAL | Status: DC | PRN

## 2015-07-08 MED ORDER — IBUPROFEN 800 MG PO TABS: 800.0000 mg | ORAL_TABLET | Freq: Three times a day (TID) | ORAL | Status: DC | PRN

## 2015-07-08 NOTE — Discharge Instructions (Signed)
Call if temperature greater than equal to 100.4, nothing per vagina for 4-6 weeks or severe nausea vomiting, increased incisional pain , drainage or redness in the incision site, no straining with bowel movements, showers no bath °

## 2015-07-08 NOTE — Progress Notes (Signed)
Subjective: Patient reports tolerating PO, + flatus and no problems voiding.    Objective: I have reviewed patient's vital signs.  vital signs, intake and output and labs. Filed Vitals:   07/08/15 0600 07/08/15 0934  BP: 131/61 124/56  Pulse: 67 87  Temp: 99 F (37.2 C) 98.6 F (37 C)  Resp: 20 20   I/O last 3 completed shifts: In: 2510 [P.O.:960; I.V.:1550] Out: 2520 [Urine:2500; Blood:20]    Lab Results  Component Value Date   WBC 8.8 07/08/2015   HGB 10.5* 07/08/2015   HCT 31.4* 07/08/2015   MCV 79.5 07/08/2015   PLT 315 07/08/2015   Lab Results  Component Value Date   CREATININE 1.51* 07/03/2015    EXAM General: alert, cooperative and no distress Resp: clear to auscultation bilaterally Cardio: regular rate and rhythm, S1, S2 normal, no murmur, click, rub or gallop GI: soft, non-tender; bowel sounds normal; no masses,  no organomegaly and RUQ shunt reservoir. primary dressing intact/dry/small amount of blood staining Extremities: edema tr Vaginal Bleeding: none  Assessment: s/p Procedure(s): EXPLORATORY LAPAROTOMY  LEFT OVARIAN CYSTECTOMY, LEFT PARATUBAL CYSTECTOMY RIGHT SALPINGO OOPHORECTOMY: stable, progressing well and tolerating diet  Plan: Encourage ambulation Discontinue IV fluids Discharge home  D/c instructions reviewed. Staple removal in office wed or thursday  LOS: 1 day    Namrata Dangler A, MD 07/08/2015 10:18 AM    07/08/2015, 10:18 AM

## 2015-07-08 NOTE — Progress Notes (Signed)
D/c instructions and prescriptions reviewed with pt. Pt verbalized understanding. Will f/u with MD on Thurs 5-18 for staple removal. Pt stable, ambulatory to private car with family.

## 2015-07-08 NOTE — Op Note (Signed)
Christina Davenport, Christina Davenport             ACCOUNT NO.:  [id number]  MEDICAL RECORD NO.:  [id number]  LOCATION:  [id number]                          FACILITY:  [facility]  PHYSICIAN:  Servando Salina, M.D.DATE OF BIRTH:  Jun 09, 1970  DATE OF PROCEDURE:  07/07/2015 DATE OF DISCHARGE:                              OPERATIVE REPORT   PREOPERATIVE DIAGNOSIS:  Right ovarian dermoid cyst.  PROCEDURES:  Exploratory laparotomy, right salpingo-oophorectomy, left ovarian cystectomy, left paratubal cyst removal, pelvic washings.  POSTOPERATIVE DIAGNOSES:  Bilateral ovarian cyst, left paratubal cyst.  ANESTHESIA:  General.  SURGEON:  Servando Salina, M.D.  ASSISTANT:  Hester Mates, CNM  DESCRIPTION OF PROCEDURE:  Under adequate general anesthesia, the patient was placed in the supine position.  Examination under anesthesia had revealed a very mobile adnexal mass, arising just above the umbilicus.  The patient was sterilely prepped and draped in the usual fashion after pannus retractor was placed exteriorly.  An indwelling Foley catheter was sterilely placed.  A 0.25% Marcaine was injected along the planned Pfannenstiel skin incision site.  Pfannenstiel skin incision was then made, carried down to the rectus fascia.  Rectus fascia was opened transversely.  The rectus fascia was then bluntly and sharply dissected off the rectus muscle in superior and inferior fashion.  The rectus muscle was split in the midline.  The parietal peritoneum was entered bluntly.  It was subsequently opened slightly.  A 500 mL of normal saline was instilled in the abdomen, 350 mL was recovered for peritoneal washings.  The remaining parietal excision was then opened further.  Inspection revealed a palpable mass with the tube arising off the right side of the uterus and multicystic left ovary with a large left paratubal cyst.  Alexis retractor was then placed.  The large  mass was exteriorized.  No evidence of a normal ovary was identified.  Therefore, decision was then made to remove the right tube and ovary.  The right retroperitoneal space was then opened.  The ovarian vessels were isolated. A window was placed below the vessels and they were proximally and distally tied with 0 Vicryl suture x2 and then cut. Serial clamping of the mesosalpinx and meso-ovarian tissue was done with removal of the right tube and ovary.  The hemostasis was then achieved with 0 Vicryl sutures.  Attention was then turned to the opposite side where that ovary was enlarged as well as multicystic.  The overlying the cystic area was opened, and 2 abutting cysts were removed.  Deep within that, an additional other cyst was also removed x2.  The defect was then closed with 3-0 Vicryl suture in a baseball fashion.  The paratubal cyst was identified and removed.  Due to the patient having a shunt in her right upper quadrant, upper abdominal exploration was not done on the right side.  On the left upper quadrant, there was no palpable mass.  The abdomen was then irrigated and suctioned.  The left ovary was  wrapped with Interceed.  The Alexis retractor was then removed and the parietal peritoneum was then closed with 2-0 Vicryl, the rectus fascia with 0 Vicryl x2.  The subcutaneous area was irrigated, small bleeders cauterized.  Interrupted 2-0 plain sutures  placed and the skin approximated with Ethicon staples.  SPECIMENS:  Pelvic washings, right tube and ovary, left ovarian cysts, and left paratubal cyst,  sent to Pathology.  ESTIMATED BLOOD LOSS:  20 mL.  INTRAOPERATIVE FLUIDS:  1300 mL.  URINE OUTPUT:  350 mL clear yellow urine.  COUNTS:  Sponge and instrument counts x2 were correct.  COMPLICATIONS:  None.  The patient tolerated the procedure well.  She was transferred to recovery in stable condition.     Servando Salina, M.D.     [place]/MEDQ  D:  07/07/2015  T:   07/08/2015  Job:  [id number]

## 2015-07-08 NOTE — Discharge Summary (Signed)
Physician Discharge Summary  Patient ID: Christina Davenport MRN: [id number] DOB/AGE: Mar 08, 1970 45 y.o.  Admit date: 07/07/2015 Discharge date: 07/08/2015  Admission Diagnoses: large dermoid cyst  Discharge Diagnoses: bilateral ovarian cysts, left paratubal cyst Active problem: HTN DM. Shunt S/P rso, left ovarian cystectomy, left paratubal cyst pending path  Discharged Condition: stable  Hospital Course: Pt underwent exp lap, RSO, left ovarian cystectomy, left paratubal cyst. Uncomplicated Postoperative course  Consults: None  Significant Diagnostic Studies: labs:  CBC    Component Value Date/Time   WBC 8.8 07/08/2015 0524   RBC 3.95 07/08/2015 0524   HGB 10.5* 07/08/2015 0524   HCT 31.4* 07/08/2015 0524   PLT 315 07/08/2015 0524   MCV 79.5 07/08/2015 0524   MCH 26.6 07/08/2015 0524   MCHC 33.4 07/08/2015 0524   RDW 14.2 07/08/2015 0524   LYMPHSABS 1.8 02/17/2010 0028   MONOABS 0.6 02/17/2010 0028   EOSABS 0.2 02/17/2010 0028   BASOSABS 0.0 02/17/2010 0028      Treatments: surgery: exp lap, RSO, left ovarian cystectomy, left paratubal cyst  Discharge Exam: Blood pressure 124/56, pulse 87, temperature 98.6 F (37 C), temperature source Oral, resp. rate 20, height 5\' 5"  (1.651 m), weight 132.11 kg (291 lb 4 oz), SpO2 99 %. General appearance: alert, cooperative and no distress Resp: clear to auscultation bilaterally Cardio: regular rate and rhythm, S1, S2 normal, no murmur, click, rub or gallop GI: soft obese nondistended active BS Extremities: edema tr Skin: Skin color, texture, turgor normal. No rashes or lesions Incision/Wound:primary dressing intact sl staining  Disposition:   Discharge Instructions     Remove dressing in 72 hours    Complete by:  As directed      Diet - low sodium heart healthy    Complete by:  As directed      Diet Carb Modified    Complete by:  As directed      Discharge instructions    Complete by:  As directed   Call if  temperature greater than equal to 100.4, nothing per vagina for 4-6 weeks or severe nausea vomiting, increased incisional pain , drainage or redness in the incision site, no straining with bowel movements, showers no bath     Increase activity slowly    Complete by:  As directed             Medication List    TAKE these medications        acetaminophen 500 MG tablet  Commonly known as:  TYLENOL  Take 500 mg by mouth every 6 (six) hours as needed for mild pain.     amLODipine 10 MG tablet  Commonly known as:  NORVASC  take 1 tablet by mouth once daily     atorvastatin 10 MG tablet  Commonly known as:  LIPITOR  Take 1 tablet (10 mg total) by mouth daily.     cloNIDine 0.2 MG tablet  Commonly known as:  CATAPRES  Take 0.2 mg by mouth at bedtime.     ibuprofen 800 MG tablet  Commonly known as:  ADVIL,MOTRIN  Take 1 tablet (800 mg total) by mouth every 8 (eight) hours as needed for moderate pain (mild pain).     oxyCODONE-acetaminophen 5-325 MG tablet  Commonly known as:  PERCOCET/ROXICET  Take 1-2 tablets by mouth every 4 (four) hours as needed for severe pain (moderate to severe pain (when tolerating fluids)).     triamterene-hydrochlorothiazide 37.5-25 MG tablet  Commonly known as:  [employer]  Take 1 tablet by mouth daily.           Follow-up Information    Follow up with Vora Clover A, MD In 4 weeks.   Specialty:  Obstetrics and Gynecology   Contact information:   [address] [place] [address] [phone]       Signed: Servando Salina A 07/08/2015, 10:27 AM

## 2015-07-10 ENCOUNTER — Encounter (HOSPITAL_COMMUNITY): Payer: Self-pay | Admitting: Obstetrics and Gynecology

## 2015-07-17 NOTE — Anesthesia Postprocedure Evaluation (Signed)
Anesthesia Post Note  Patient: TANEASHA MOSAKOWSKI  Procedure(s) Performed: Procedure(s) (LRB): EXPLORATORY LAPAROTOMY  LEFT OVARIAN CYSTECTOMY, LEFT PARATUBAL CYSTECTOMY (Bilateral) RIGHT SALPINGO OOPHORECTOMY (Right)  Patient location during evaluation: PACU Anesthesia Type: General Level of consciousness: sedated Pain management: pain level controlled Vital Signs Assessment: post-procedure vital signs reviewed and stable Respiratory status: spontaneous breathing Cardiovascular status: stable Postop Assessment: no signs of nausea or vomiting Anesthetic complications: no     Last Vitals:  Filed Vitals:   07/08/15 0600 07/08/15 0934  BP: 131/61 124/56  Pulse: 67 87  Temp: 37.2 C 37 C  Resp: 20 20    Last Pain:  Filed Vitals:   07/08/15 1051  PainSc: 2    Pain Goal: Patients Stated Pain Goal: 2 (07/08/15 1051)               [place]

## 2015-07-18 ENCOUNTER — Other Ambulatory Visit: Payer: Self-pay

## 2015-07-18 DIAGNOSIS — E785 Hyperlipidemia, unspecified: Secondary | ICD-10-CM

## 2015-07-18 MED ORDER — AMLODIPINE BESYLATE 10 MG PO TABS: 10.0000 mg | ORAL_TABLET | Freq: Every day | ORAL | Status: DC

## 2015-07-18 MED ORDER — ATORVASTATIN CALCIUM 10 MG PO TABS
10.0000 mg | ORAL_TABLET | Freq: Every day | ORAL | Status: DC
Start: 2015-07-18 — End: 2015-12-28

## 2015-07-18 MED ORDER — TRIAMTERENE-HCTZ 37.5-25 MG PO TABS: 1.0000 | ORAL_TABLET | Freq: Every day | ORAL | Status: DC

## 2015-08-17 ENCOUNTER — Other Ambulatory Visit: Payer: Self-pay

## 2015-08-17 MED ORDER — TRIAMTERENE-HCTZ 37.5-25 MG PO TABS: 1.0000 | ORAL_TABLET | Freq: Every day | ORAL | Status: DC

## 2015-11-16 ENCOUNTER — Ambulatory Visit: Payer: BLUE CROSS/BLUE SHIELD | Admitting: Family Medicine

## 2015-12-12 ENCOUNTER — Ambulatory Visit: Payer: BLUE CROSS/BLUE SHIELD | Admitting: Family Medicine

## 2015-12-28 ENCOUNTER — Ambulatory Visit: Payer: BLUE CROSS/BLUE SHIELD | Admitting: Family Medicine

## 2015-12-28 ENCOUNTER — Other Ambulatory Visit: Payer: Self-pay

## 2015-12-28 ENCOUNTER — Ambulatory Visit (INDEPENDENT_AMBULATORY_CARE_PROVIDER_SITE_OTHER): Payer: BLUE CROSS/BLUE SHIELD | Admitting: Family Medicine

## 2015-12-28 ENCOUNTER — Encounter: Payer: Self-pay | Admitting: Family Medicine

## 2015-12-28 VITALS — BP 128/82 | HR 93 | Ht 65.0 in | Wt 293.0 lb

## 2015-12-28 DIAGNOSIS — Z87898 Personal history of other specified conditions: Secondary | ICD-10-CM

## 2015-12-28 DIAGNOSIS — E785 Hyperlipidemia, unspecified: Secondary | ICD-10-CM | POA: Diagnosis not present

## 2015-12-28 DIAGNOSIS — Z23 Encounter for immunization: Secondary | ICD-10-CM | POA: Diagnosis not present

## 2015-12-28 DIAGNOSIS — E1169 Type 2 diabetes mellitus with other specified complication: Secondary | ICD-10-CM

## 2015-12-28 DIAGNOSIS — N183 Chronic kidney disease, stage 3 unspecified: Secondary | ICD-10-CM

## 2015-12-28 DIAGNOSIS — I10 Essential (primary) hypertension: Secondary | ICD-10-CM

## 2015-12-28 DIAGNOSIS — E669 Obesity, unspecified: Secondary | ICD-10-CM

## 2015-12-28 HISTORY — DX: Personal history of other specified conditions: Z87.898

## 2015-12-28 MED ORDER — AMLODIPINE BESYLATE 10 MG PO TABS: 10.0000 mg | ORAL_TABLET | Freq: Every day | ORAL | 5 refills | Status: DC

## 2015-12-28 MED ORDER — CLONIDINE HCL 0.2 MG PO TABS: 0.2000 mg | ORAL_TABLET | Freq: Every day | ORAL | 5 refills | Status: DC

## 2015-12-28 MED ORDER — AMLODIPINE BESYLATE 10 MG PO TABS
10.0000 mg | ORAL_TABLET | Freq: Every day | ORAL | 5 refills | Status: DC
Start: 2015-12-28 — End: 2015-12-28

## 2015-12-28 MED ORDER — ATORVASTATIN CALCIUM 10 MG PO TABS: 10.0000 mg | ORAL_TABLET | Freq: Every day | ORAL | 5 refills | Status: DC

## 2015-12-28 MED ORDER — TRIAMTERENE-HCTZ 37.5-25 MG PO TABS: 1.0000 | ORAL_TABLET | Freq: Every day | ORAL | 5 refills | Status: DC

## 2015-12-28 NOTE — Addendum Note (Signed)
Addended by: Lysle Morales on: 12/28/2015 04:53 PM   Modules accepted: Orders

## 2015-12-28 NOTE — Progress Notes (Signed)
Chief Complaint  Patient presents with  . Obesity    follow up visit and med refills needed   . Hypertension  . Diabetes  Routine visit Feels well Didn't get labs Reminded to do so Weight is up Reminded regarding diet and exercise recommendations BP is good Health maintenance up to date Did beautifully after her oophorectomy and is much improved with pelvic pain and menstrual problems. DM well controlled with last A1c of 6.3 Lipids not well controlled with last LDL of 143, will repeat today on Lipitor BP is good today and patient is compliant with medicines.   Patient Active Problem List   Diagnosis Date Noted  . History of brain tumor 12/28/2015  . S/P right oophorectomy 07/07/2015  . CKD (chronic kidney disease) stage 3, GFR 30-59 ml/min 02/21/2015  . Hyperlipidemia LDL goal <100 11/02/2013  . Endometrial thickening on ultra sound 01/08/2012  . Ovarian cyst 01/08/2012  . Diabetes mellitus type 2 in obese ([facility]) 12/09/2007  . Morbid obesity ([facility]) 06/24/2007  . Essential hypertension 06/24/2007    Outpatient Encounter Prescriptions as of 12/28/2015  Medication Sig  . amLODipine (NORVASC) 10 MG tablet Take 1 tablet (10 mg total) by mouth daily.  Marland Kitchen atorvastatin (LIPITOR) 10 MG tablet Take 1 tablet (10 mg total) by mouth daily.  . cloNIDine (CATAPRES) 0.2 MG tablet Take 1 tablet (0.2 mg total) by mouth at bedtime.  . triamterene-hydrochlorothiazide (MAXZIDE-25) 37.5-25 MG tablet Take 1 tablet by mouth daily.  Marland Kitchen acetaminophen (TYLENOL) 500 MG tablet Take 500 mg by mouth every 6 (six) hours as needed for mild pain.  Marland Kitchen ibuprofen (ADVIL,MOTRIN) 800 MG tablet Take 1 tablet (800 mg total) by mouth every 8 (eight) hours as needed for moderate pain (mild pain).   No facility-administered encounter medications on file as of 12/28/2015.     Allergies  Allergen Reactions  . Enalapril Maleate Hives, Itching and Swelling  . Metformin Nausea And Vomiting    abd pain,     Review  of Systems  Constitutional: Positive for unexpected weight change. Negative for activity change and appetite change.  HENT: Negative for congestion and dental problem.   Eyes: Negative for redness and visual disturbance.       Overdue for eye exam  Respiratory: Negative for cough and shortness of breath.   Cardiovascular: Negative for chest pain, palpitations and leg swelling.  Gastrointestinal: Negative for constipation and diarrhea.  Genitourinary: Negative for difficulty urinating and menstrual problem.  Musculoskeletal: Negative for arthralgias and back pain.  Neurological: Positive for facial asymmetry.       Residual from brain tumor  Psychiatric/Behavioral: Negative for dysphoric mood and sleep disturbance. The patient is not nervous/anxious.    BP 128/82   Pulse 93   Ht 5\' 5"  (1.651 m)   Wt 293 lb (132.9 kg)   SpO2 100%   BMI 48.76 kg/m   Physical Exam  Constitutional: She is oriented to person, place, and time. She appears well-developed and well-nourished. No distress.  Left face droop  HENT:  Head: Normocephalic and atraumatic.  Right Ear: External ear normal.  Left Ear: External ear normal.  Mouth/Throat: Oropharynx is clear and moist.  Eyes: Conjunctivae are normal. Pupils are equal, round, and reactive to light.  Neck: Normal range of motion.  Cardiovascular: Normal rate, regular rhythm and normal heart sounds.   Pulmonary/Chest: Effort normal and breath sounds normal. No respiratory distress.  Abdominal: Soft. Bowel sounds are normal.  Musculoskeletal: Normal range of motion.  She exhibits no edema.  Lymphadenopathy:    She has no cervical adenopathy.  Neurological: She is alert and oriented to person, place, and time. She has normal reflexes.  Psychiatric: She has a normal mood and affect. Her behavior is normal. Thought content normal.   ASSESSMENT/PLAN:   ASSESSMENT/PLAN:  1. Hyperlipidemia LDL goal <100  - atorvastatin (LIPITOR) 10 MG tablet; Take 1  tablet (10 mg total) by mouth daily.  Dispense: 30 tablet; Refill: 5 - Lipid panel  2. Essential hypertension  - CBC - COMPLETE METABOLIC PANEL WITH GFR - VITAMIN D 25 Hydroxy (Vit-D Deficiency, Fractures)  3. Diabetes mellitus type 2 in obese ([facility])  - Glycohemoglobin, total   4. CKD (chronic kidney disease) stage 3, GFR 30-59 ml/min  - COMPLETE METABOLIC PANEL WITH GFR  5. History of brain tumor     Patient Instructions  Medicine refilled Continue to try to eat well and stay active You are due for labs I will send you a letter with your test results.  If there is anything of concern, we will call right away.   See Dr Moshe Cipro in 6 months Labs prior   Raylene Everts, MD

## 2015-12-28 NOTE — Patient Instructions (Signed)
Medicine refilled Continue to try to eat well and stay active You are due for labs I will send you a letter with your test results.  If there is anything of concern, we will call right away.   See Dr Moshe Cipro in 6 months Labs prior

## 2015-12-28 NOTE — Addendum Note (Signed)
Addended by: Ova Freshwater on: 12/28/2015 04:52 PM   Modules accepted: Orders

## 2016-03-04 ENCOUNTER — Telehealth: Payer: Self-pay | Admitting: Family Medicine

## 2016-03-04 DIAGNOSIS — E669 Obesity, unspecified: Principal | ICD-10-CM

## 2016-03-04 DIAGNOSIS — N183 Chronic kidney disease, stage 3 unspecified: Secondary | ICD-10-CM

## 2016-03-04 DIAGNOSIS — I10 Essential (primary) hypertension: Secondary | ICD-10-CM

## 2016-03-04 DIAGNOSIS — E559 Vitamin D deficiency, unspecified: Secondary | ICD-10-CM

## 2016-03-04 DIAGNOSIS — E1169 Type 2 diabetes mellitus with other specified complication: Secondary | ICD-10-CM

## 2016-03-04 DIAGNOSIS — E785 Hyperlipidemia, unspecified: Secondary | ICD-10-CM

## 2016-03-04 NOTE — Telephone Encounter (Signed)
Pls directly contact pt x 3 calls or letter if fails Needs labs fasting 4 days prior to appt which needs to be made. CBC, iron and ferritin, lipid, cmp and eGFR, hBA1C, TSH and vit D. Thanks, let me know if probs

## 2016-03-12 NOTE — Addendum Note (Signed)
Addended by: Eual Fines on: 03/12/2016 02:00 PM   Modules accepted: Orders

## 2016-10-02 ENCOUNTER — Telehealth: Payer: Self-pay | Admitting: Family Medicine

## 2016-10-02 DIAGNOSIS — N183 Chronic kidney disease, stage 3 unspecified: Secondary | ICD-10-CM

## 2016-10-02 DIAGNOSIS — E1169 Type 2 diabetes mellitus with other specified complication: Secondary | ICD-10-CM

## 2016-10-02 DIAGNOSIS — I10 Essential (primary) hypertension: Secondary | ICD-10-CM

## 2016-10-02 DIAGNOSIS — E785 Hyperlipidemia, unspecified: Secondary | ICD-10-CM

## 2016-10-02 DIAGNOSIS — E669 Obesity, unspecified: Principal | ICD-10-CM

## 2016-10-02 DIAGNOSIS — Z1329 Encounter for screening for other suspected endocrine disorder: Secondary | ICD-10-CM

## 2016-10-02 NOTE — Telephone Encounter (Signed)
-----  Message from Fayrene Helper, MD sent at 10/01/2016  3:33 PM EDT ----- Regarding: pls mail lab order for last week in August AGAIN!!! New labs tho  CBC, fasting lipid, cmp and eGFR, hBA1C, tSH and vit D, thanks, I spoke with her,

## 2016-10-25 ENCOUNTER — Other Ambulatory Visit: Payer: Self-pay | Admitting: Family Medicine

## 2016-10-25 LAB — COMPLETE METABOLIC PANEL WITHOUT GFR
ALT: 11 U/L (ref 6–29)
AST: 16 U/L (ref 10–30)
Albumin: 3.8 g/dL (ref 3.6–5.1)
Alkaline Phosphatase: 34 U/L (ref 33–115)
BUN: 17 mg/dL (ref 7–25)
CO2: 27 mmol/L (ref 20–32)
Calcium: 8.9 mg/dL (ref 8.6–10.2)
Chloride: 99 mmol/L (ref 98–110)
Creat: 1.42 mg/dL — ABNORMAL HIGH (ref 0.50–1.10)
GFR, Est African American: 52 mL/min — ABNORMAL LOW
GFR, Est Non African American: 45 mL/min — ABNORMAL LOW
Glucose, Bld: 93 mg/dL (ref 65–99)
Potassium: 4.3 mmol/L (ref 3.5–5.3)
Sodium: 135 mmol/L (ref 135–146)
Total Bilirubin: 0.4 mg/dL (ref 0.2–1.2)
Total Protein: 6.8 g/dL (ref 6.1–8.1)

## 2016-10-25 LAB — LIPID PANEL
Cholesterol: 140 mg/dL (ref <200)
HDL: 54 mg/dL (ref >50)
LDL CALC: 70 mg/dL (ref <100)
TRIGLYCERIDES: 82 mg/dL (ref <150)
Total CHOL/HDL Ratio: 2.6 Ratio (ref <5.0)
VLDL: 16 mg/dL (ref <30)

## 2016-10-25 LAB — CBC
HCT: 26.1 % — ABNORMAL LOW (ref 35.0–45.0)
Hemoglobin: 8.1 g/dL — ABNORMAL LOW (ref 11.7–15.5)
MCH: 22.2 pg — ABNORMAL LOW (ref 27.0–33.0)
MCHC: 31 g/dL — ABNORMAL LOW (ref 32.0–36.0)
MCV: 71.5 fL — ABNORMAL LOW (ref 80.0–100.0)
MPV: 9 fL (ref 7.5–12.5)
Platelets: 356 K/uL (ref 140–400)
RBC: 3.65 MIL/uL — ABNORMAL LOW (ref 3.80–5.10)
RDW: 15.4 % — ABNORMAL HIGH (ref 11.0–15.0)
WBC: 7.1 K/uL (ref 3.8–10.8)

## 2016-10-25 LAB — TSH: TSH: 1.25 m[IU]/L

## 2016-10-26 LAB — HEMOGLOBIN A1C
Hgb A1c MFr Bld: 5.8 % — ABNORMAL HIGH (ref <5.7)
MEAN PLASMA GLUCOSE: 120 mg/dL

## 2016-10-26 LAB — VITAMIN D 25 HYDROXY (VIT D DEFICIENCY, FRACTURES): VIT D 25 HYDROXY: 24 ng/mL — AB (ref 30–100)

## 2016-10-27 ENCOUNTER — Other Ambulatory Visit: Payer: Self-pay | Admitting: Family Medicine

## 2016-10-29 ENCOUNTER — Telehealth: Payer: Self-pay | Admitting: Family Medicine

## 2016-10-29 ENCOUNTER — Ambulatory Visit (INDEPENDENT_AMBULATORY_CARE_PROVIDER_SITE_OTHER): Payer: 59 | Admitting: Family Medicine

## 2016-10-29 ENCOUNTER — Encounter: Payer: Self-pay | Admitting: Family Medicine

## 2016-10-29 VITALS — BP 160/108 | HR 108 | Temp 99.5°F | Resp 14 | Ht 65.0 in | Wt 299.0 lb

## 2016-10-29 DIAGNOSIS — E785 Hyperlipidemia, unspecified: Secondary | ICD-10-CM

## 2016-10-29 DIAGNOSIS — E669 Obesity, unspecified: Secondary | ICD-10-CM

## 2016-10-29 DIAGNOSIS — D500 Iron deficiency anemia secondary to blood loss (chronic): Secondary | ICD-10-CM | POA: Diagnosis not present

## 2016-10-29 DIAGNOSIS — I10 Essential (primary) hypertension: Secondary | ICD-10-CM

## 2016-10-29 DIAGNOSIS — Z23 Encounter for immunization: Secondary | ICD-10-CM | POA: Diagnosis not present

## 2016-10-29 DIAGNOSIS — N183 Chronic kidney disease, stage 3 unspecified: Secondary | ICD-10-CM

## 2016-10-29 DIAGNOSIS — E1169 Type 2 diabetes mellitus with other specified complication: Secondary | ICD-10-CM | POA: Diagnosis not present

## 2016-10-29 LAB — RETICULOCYTES
ABS Retic: 62560 cells/uL (ref 20000–80000)
RBC.: 3.68 MIL/uL — ABNORMAL LOW (ref 3.80–5.10)
Retic Ct Pct: 1.7 %

## 2016-10-29 LAB — IRON AND TIBC
%SAT: 4 % — ABNORMAL LOW (ref 11–50)
Iron: 17 ug/dL — ABNORMAL LOW (ref 40–190)
TIBC: 424 ug/dL (ref 250–450)
UIBC: 407 ug/dL

## 2016-10-29 NOTE — Telephone Encounter (Signed)
Labs add on.

## 2016-10-29 NOTE — Telephone Encounter (Signed)
-----   Message from Fayrene Helper, MD sent at 10/27/2016 11:31 PM EDT ----- PLS ADD ANEMIA PANEL

## 2016-10-29 NOTE — Patient Instructions (Signed)
F/U in 6 to 8 weeks, call if you need me sooner  Your blood pressure is uncontrolled, it is too high and this is damaging your kidneys  You need to commit to taking ALL THREE blood pressure medications EVERY day as prescribed, INCLUDING bedtime clonidine  OTC iron 325 mg one twice daily  Please start OTC vitamin D3 2000 IU one daily  Please sign for release of your eye exam done in January of this year  Please work on exercise and weight loss and change in diet , low sodium and increase fresh/ frozen fruit and vegetable to improve vlood pressure  Thank you  for choosing [facility]. We consider it a privelige to serve you.  Delivering excellent health care in a caring and  compassionate way is our goal.  Partnering with you,  so that together we can achieve this goal is our strategy.   Flu vaccine today

## 2016-10-30 ENCOUNTER — Encounter: Payer: Self-pay | Admitting: Family Medicine

## 2016-10-30 LAB — VITAMIN B12: Vitamin B-12: 1489 pg/mL — ABNORMAL HIGH (ref 200–1100)

## 2016-10-30 LAB — MICROALBUMIN / CREATININE URINE RATIO
CREATININE, URINE: 202 mg/dL (ref 20–320)
Microalb Creat Ratio: 6 mcg/mg creat (ref <30)
Microalb, Ur: 1.2 mg/dL

## 2016-10-30 LAB — FERRITIN: FERRITIN: 4 ng/mL — AB (ref 10–232)

## 2016-10-30 LAB — FOLATE: Folate: 9 ng/mL (ref >5.4)

## 2016-11-03 DIAGNOSIS — D509 Iron deficiency anemia, unspecified: Secondary | ICD-10-CM | POA: Insufficient documentation

## 2016-11-03 NOTE — Assessment & Plan Note (Signed)
Hyperlipidemia:Low fat diet discussed and encouraged.   Lipid Panel  Lab Results  Component Value Date   CHOL 140 10/25/2016   HDL 54 10/25/2016   LDLCALC 70 10/25/2016   TRIG 82 10/25/2016   CHOLHDL 2.6 10/25/2016   Controlled, no change in medication

## 2016-11-03 NOTE — Assessment & Plan Note (Addendum)
IDA due to heavy irregular menses with flooding and clotting, needs to f/u with gyne .OTC iron supplementation 2 to 3 tabs daily

## 2016-11-03 NOTE — Progress Notes (Signed)
ADELA ESTEBAN     MRN: [id number]      DOB: 25-Jul-1970   HPI Ms. Biskup is here for follow up and re-evaluation of chronic medical conditions, medication management and review of any available recent lab and radiology data.  Preventive health is updated, specifically  Cancer screening and Immunization.   Questions or concerns regarding consultations or procedures which the PT has had in the interim are  addressed. The PT denies any adverse reactions to current medications since the last visit.  There are no new concerns.  There are no specific complaints   ROS Denies recent fever or chills. Denies sinus pressure, nasal congestion, ear pain or sore throat. Denies chest congestion, productive cough or wheezing. Denies chest pains, palpitations and leg swelling Denies abdominal pain, nausea, vomiting,diarrhea or constipation.   Denies dysuria, frequency, hesitancy or incontinence. Denies joint pain, swelling and limitation in mobility. Denies headaches, seizures, numbness, or tingling. Denies depression, anxiety or insomnia. Denies skin break down or rash.   PE  BP (!) 160/108 (BP Location: Left Wrist, Patient Position: Sitting, Cuff Size: Normal)   Pulse (!) 108   Temp 99.5 F (37.5 C)   Ht 5\' 5"  (1.651 m)   Wt 299 lb (135.6 kg)   BMI 49.76 kg/m   Patient alert and oriented and in no cardiopulmonary distress.  HEENT: No facial asymmetry, EOMI,   oropharynx pink and moist.  Neck supple no JVD, no mass.  Chest: Clear to auscultation bilaterally.  CVS: S1, S2 no murmurs, no S3.Regular rate.  ABD: Soft non tender.   Ext: No edema  MS: Adequate ROM spine, shoulders, hips and knees.  Skin: Intact, no ulcerations or rash noted.  Psych: Good eye contact, normal affect. Memory intact not anxious or depressed appearing.  CNS: CN 2-12 intact, power,  normal throughout.no focal deficits noted.   Assessment & Plan  Essential hypertension Uncontrolled , medication  adjusted and close f/u arranged DASH diet and commitment to daily physical activity for a minimum of 30 minutes discussed and encouraged, as a part of hypertension management. The importance of attaining a healthy weight is also discussed.  BP/Weight 10/29/2016 12/28/2015 07/08/2015 07/07/2015 07/03/2015 05/25/2015 72/10/4707  Systolic BP 628 366 294 - 765 465 035  Diastolic BP 465 82 56 - 88 82 94  Wt. (Lbs) 299 293 - 291.25 291.25 303 302.04  BMI 49.76 48.76 - 48.47 48.47 50.42 50.26       Diabetes mellitus type 2 in obese [facility]) Ms. Firkus is reminded of the importance of commitment to daily physical activity for 30 minutes or more, as able and the need to limit carbohydrate intake to 30 to 60 grams per meal to help with blood sugar control.   Ms. Yusupov is reminded of the importance of daily foot exam, annual eye examination, and good blood sugar, blood pressure and cholesterol control.  Diabetic Labs Latest Ref Rng & Units 10/29/2016 10/25/2016 07/03/2015 05/25/2015 05/23/2015  HbA1c <5.7 % - 5.8(H) - - 6.3(H)  Microalbumin Not estab mg/dL 1.2 - - 1.1 -  Micro/Creat Ratio <30 mcg/mg creat 6 - - 13 -  Chol <200 mg/dL - 140 - - -  HDL >50 mg/dL - 54 - - -  Calc LDL <100 mg/dL - 70 - - -  Triglycerides <150 mg/dL - 82 - - -  Creatinine 0.50 - 1.10 mg/dL - 1.42(H) 1.51(H) - 1.32(H)   BP/Weight 10/29/2016 12/28/2015 07/08/2015 07/07/2015 07/03/2015 05/25/2015 68/01/7516  Systolic BP 001  128 124 - 338 329 191  Diastolic BP 660 82 56 - 88 82 94  Wt. (Lbs) 299 293 - 291.25 291.25 303 302.04  BMI 49.76 48.76 - 48.47 48.47 50.42 50.26   Foot/eye exam completion dates Latest Ref Rng & Units 02/21/2015 11/02/2013  Eye Exam No Retinopathy - -  Foot exam Order - - -  Foot Form Completion - Done Done        Hyperlipidemia LDL goal <100 Hyperlipidemia:Low fat diet discussed and encouraged.   Lipid Panel  Lab Results  Component Value Date   CHOL 140 10/25/2016   HDL 54 10/25/2016   LDLCALC 70  10/25/2016   TRIG 82 10/25/2016   CHOLHDL 2.6 10/25/2016   Controlled, no change in medication     Morbid obesity Deteriorated. Patient re-educated about  the importance of commitment to a  minimum of 150 minutes of exercise per week.  The importance of healthy food choices with portion control discussed. Encouraged to start a food diary, count calories and to consider  joining a support group. Sample diet sheets offered. Goals set by the patient for the next several months.   Weight /BMI 10/29/2016 12/28/2015 07/07/2015  WEIGHT 299 lb 293 lb 291 lb 4 oz  HEIGHT 5\' 5"  5\' 5"  5\' 5"   BMI 49.76 kg/m2 48.76 kg/m2 48.47 kg/m2      IDA (iron deficiency anemia) IDA due to heavy irregular menses with flooding and clotting, needs to f/u with gyne .OTC iron supplementation 2 to 3 tabs daily  CKD (chronic kidney disease) stage 3, GFR 30-59 ml/min Need to control hypertension is stressed

## 2016-11-03 NOTE — Assessment & Plan Note (Signed)
Deteriorated. Patient re-educated about  the importance of commitment to a  minimum of 150 minutes of exercise per week.  The importance of healthy food choices with portion control discussed. Encouraged to start a food diary, count calories and to consider  joining a support group. Sample diet sheets offered. Goals set by the patient for the next several months.   Weight /BMI 10/29/2016 12/28/2015 07/07/2015  WEIGHT 299 lb 293 lb 291 lb 4 oz  HEIGHT 5\' 5"  5\' 5"  5\' 5"   BMI 49.76 kg/m2 48.76 kg/m2 48.47 kg/m2

## 2016-11-03 NOTE — Assessment & Plan Note (Signed)
Need to control hypertension is stressed

## 2016-11-03 NOTE — Assessment & Plan Note (Signed)
Uncontrolled , medication adjusted and close f/u arranged DASH diet and commitment to daily physical activity for a minimum of 30 minutes discussed and encouraged, as a part of hypertension management. The importance of attaining a healthy weight is also discussed.  BP/Weight 10/29/2016 12/28/2015 07/08/2015 07/07/2015 07/03/2015 05/25/2015 03/83/3383  Systolic BP 291 916 606 - 004 599 774  Diastolic BP 142 82 56 - 88 82 94  Wt. (Lbs) 299 293 - 291.25 291.25 303 302.04  BMI 49.76 48.76 - 48.47 48.47 50.42 50.26

## 2016-11-03 NOTE — Assessment & Plan Note (Signed)
Christina Davenport is reminded of the importance of commitment to daily physical activity for 30 minutes or more, as able and the need to limit carbohydrate intake to 30 to 60 grams per meal to help with blood sugar control.   Christina Davenport is reminded of the importance of daily foot exam, annual eye examination, and good blood sugar, blood pressure and cholesterol control.  Diabetic Labs Latest Ref Rng & Units 10/29/2016 10/25/2016 07/03/2015 05/25/2015 05/23/2015  HbA1c <5.7 % - 5.8(H) - - 6.3(H)  Microalbumin Not estab mg/dL 1.2 - - 1.1 -  Micro/Creat Ratio <30 mcg/mg creat 6 - - 13 -  Chol <200 mg/dL - 140 - - -  HDL >50 mg/dL - 54 - - -  Calc LDL <100 mg/dL - 70 - - -  Triglycerides <150 mg/dL - 82 - - -  Creatinine 0.50 - 1.10 mg/dL - 1.42(H) 1.51(H) - 1.32(H)   BP/Weight 10/29/2016 12/28/2015 07/08/2015 07/07/2015 07/03/2015 05/25/2015 55/97/4163  Systolic BP 845 364 680 - 321 224 825  Diastolic BP 003 82 56 - 88 82 94  Wt. (Lbs) 299 293 - 291.25 291.25 303 302.04  BMI 49.76 48.76 - 48.47 48.47 50.42 50.26   Foot/eye exam completion dates Latest Ref Rng & Units 02/21/2015 11/02/2013  Eye Exam No Retinopathy - -  Foot exam Order - - -  Foot Form Completion - Done Done

## 2016-11-04 ENCOUNTER — Other Ambulatory Visit: Payer: Self-pay | Admitting: Family Medicine

## 2016-11-30 ENCOUNTER — Other Ambulatory Visit: Payer: Self-pay | Admitting: Family Medicine

## 2016-11-30 DIAGNOSIS — E785 Hyperlipidemia, unspecified: Secondary | ICD-10-CM

## 2016-12-02 NOTE — Telephone Encounter (Signed)
Seen [phone] 

## 2016-12-10 ENCOUNTER — Other Ambulatory Visit: Payer: Self-pay | Admitting: Family Medicine

## 2016-12-10 DIAGNOSIS — E785 Hyperlipidemia, unspecified: Secondary | ICD-10-CM

## 2016-12-10 DIAGNOSIS — I10 Essential (primary) hypertension: Secondary | ICD-10-CM

## 2016-12-10 MED ORDER — ATORVASTATIN CALCIUM 10 MG PO TABS: 10.0000 mg | ORAL_TABLET | Freq: Every day | ORAL | 5 refills | Status: DC

## 2016-12-10 MED ORDER — TRIAMTERENE-HCTZ 37.5-25 MG PO TABS: 1.0000 | ORAL_TABLET | Freq: Every day | ORAL | 5 refills | Status: DC

## 2016-12-10 MED ORDER — CLONIDINE HCL 0.2 MG PO TABS: 0.2000 mg | ORAL_TABLET | Freq: Every day | ORAL | 5 refills | Status: DC

## 2016-12-10 MED ORDER — AMLODIPINE BESYLATE 10 MG PO TABS: 10.0000 mg | ORAL_TABLET | Freq: Every day | ORAL | 1 refills | Status: DC

## 2016-12-10 NOTE — Telephone Encounter (Signed)
Patient calling to request her meds. Amlodipine, lipitor, clonidine, & triamterene. She called [facility] in [place] as there were no refills. She states she is almost out.  When she was here last for her appointment, she was to follow up 6-8 weeks, but there are no appts to accommodate sooner and provide her with 4pm appt as she must have latest in the day. She is set for Dec 3.  Also she is asking about Labs-mentioning that doctor was checking iron.  Please advise patient.

## 2017-01-27 ENCOUNTER — Ambulatory Visit: Payer: 59 | Admitting: Family Medicine

## 2017-06-08 ENCOUNTER — Other Ambulatory Visit: Payer: Self-pay | Admitting: Family Medicine

## 2017-06-08 DIAGNOSIS — I10 Essential (primary) hypertension: Secondary | ICD-10-CM

## 2017-07-06 ENCOUNTER — Other Ambulatory Visit: Payer: Self-pay | Admitting: Family Medicine

## 2017-07-06 DIAGNOSIS — I10 Essential (primary) hypertension: Secondary | ICD-10-CM

## 2017-08-11 ENCOUNTER — Other Ambulatory Visit: Payer: Self-pay | Admitting: Family Medicine

## 2017-08-11 DIAGNOSIS — I10 Essential (primary) hypertension: Secondary | ICD-10-CM

## 2017-09-12 ENCOUNTER — Other Ambulatory Visit: Payer: Self-pay

## 2017-09-12 ENCOUNTER — Telehealth: Payer: Self-pay | Admitting: Family Medicine

## 2017-09-12 ENCOUNTER — Other Ambulatory Visit: Payer: Self-pay | Admitting: Family Medicine

## 2017-09-12 DIAGNOSIS — I10 Essential (primary) hypertension: Secondary | ICD-10-CM

## 2017-09-12 DIAGNOSIS — E785 Hyperlipidemia, unspecified: Secondary | ICD-10-CM

## 2017-09-12 MED ORDER — ATORVASTATIN CALCIUM 10 MG PO TABS: 10.0000 mg | ORAL_TABLET | Freq: Every day | ORAL | 4 refills | Status: DC

## 2017-09-12 MED ORDER — AMLODIPINE BESYLATE 10 MG PO TABS: 10.0000 mg | ORAL_TABLET | Freq: Every day | ORAL | 4 refills | Status: DC

## 2017-09-12 NOTE — Telephone Encounter (Signed)
Amlodipine 10 mg & atorvastatin 10mg ,,,,  [facility] in [place] Lucianne Lei buren)

## 2017-09-12 NOTE — Telephone Encounter (Signed)
Medications refilled and sent in to patients requested pharmacy

## 2017-09-25 ENCOUNTER — Ambulatory Visit (INDEPENDENT_AMBULATORY_CARE_PROVIDER_SITE_OTHER): Payer: 59 | Admitting: Family Medicine

## 2017-09-25 ENCOUNTER — Encounter: Payer: Self-pay | Admitting: Family Medicine

## 2017-09-25 ENCOUNTER — Other Ambulatory Visit: Payer: Self-pay

## 2017-09-25 VITALS — BP 130/88 | HR 80 | Resp 12 | Ht 65.0 in | Wt 296.1 lb

## 2017-09-25 DIAGNOSIS — E785 Hyperlipidemia, unspecified: Secondary | ICD-10-CM | POA: Diagnosis not present

## 2017-09-25 DIAGNOSIS — R7302 Impaired glucose tolerance (oral): Secondary | ICD-10-CM | POA: Insufficient documentation

## 2017-09-25 DIAGNOSIS — D500 Iron deficiency anemia secondary to blood loss (chronic): Secondary | ICD-10-CM

## 2017-09-25 DIAGNOSIS — N183 Chronic kidney disease, stage 3 unspecified: Secondary | ICD-10-CM

## 2017-09-25 DIAGNOSIS — I10 Essential (primary) hypertension: Secondary | ICD-10-CM | POA: Diagnosis not present

## 2017-09-25 MED ORDER — AMLODIPINE BESYLATE 10 MG PO TABS: 10.0000 mg | ORAL_TABLET | Freq: Every day | ORAL | 11 refills | Status: DC

## 2017-09-25 MED ORDER — ATORVASTATIN CALCIUM 10 MG PO TABS: 10.0000 mg | ORAL_TABLET | Freq: Every day | ORAL | 11 refills | Status: DC

## 2017-09-25 MED ORDER — CLONIDINE HCL 0.2 MG PO TABS: 0.2000 mg | ORAL_TABLET | Freq: Every day | ORAL | 11 refills | Status: DC

## 2017-09-25 MED ORDER — TRIAMTERENE-HCTZ 37.5-25 MG PO TABS: 1.0000 | ORAL_TABLET | Freq: Every day | ORAL | 11 refills | Status: DC

## 2017-09-25 NOTE — Patient Instructions (Addendum)
F/U in 1 year , call if you  Need me before  Fasting lipid, cmp and EGFr , HBA1C, CBC, iron and ferritin,  TSH and vit D ( [facility]) 8/31 or first week in Sept, labs will be sent to you via My chart.  Work on Radiographer, therapeutic , discipline in Scientist, physiological and weight loss  Goal of 25 to 30 pounds in next 12 months   It is important that you exercise regularly at least 30 minutes 7 times a week. If you develop chest pain, have severe difficulty breathing, or feel very tired, stop exercising immediately and seek medical attention   As an AA colon cancer screening is approved starting at age 2, you may speak with your ins company re coverage for a colonoscopy  Weight watcher and exercise classes are also covered/ helped by insurance/ health coaches on telephone

## 2017-09-27 ENCOUNTER — Encounter: Payer: Self-pay | Admitting: Family Medicine

## 2017-09-27 NOTE — Assessment & Plan Note (Signed)
Controlled, no change in medication DASH diet and commitment to daily physical activity for a minimum of 30 minutes discussed and encouraged, as a part of hypertension management. The importance of attaining a healthy weight is also discussed.  BP/Weight 09/25/2017 10/29/2016 12/28/2015 07/08/2015 07/07/2015 07/03/2015 2/48/2500  Systolic BP 370 488 891 694 - 503 888  Diastolic BP 88 280 82 56 - 88 82  Wt. (Lbs) 296.12 299 293 - 291.25 291.25 303  BMI 49.28 49.76 48.76 - 48.47 48.47 50.42

## 2017-09-27 NOTE — Assessment & Plan Note (Signed)
Updated lab needed.  

## 2017-09-27 NOTE — Assessment & Plan Note (Signed)
Updated lab needed stressed the need to avoid NSAIDS and control blood pressure , also focus on plant based diet exercise and weight loss

## 2017-09-27 NOTE — Progress Notes (Signed)
Christina Davenport     MRN: [id number]      DOB: Dec 15, 1970   HPI Christina Davenport is here for follow up and re-evaluation of chronic medical conditions, medication management and review of any available recent lab and radiology data.  Preventive health is updated, specifically  Cancer screening and Immunization.   Questions or concerns regarding consultations or procedures which the PT has had in the interim are  addressed. The PT denies any adverse reactions to current medications since the last visit.  There are no new concerns.  There are no specific complaints   ROS Denies recent fever or chills. Denies sinus pressure, nasal congestion, ear pain or sore throat. Denies chest congestion, productive cough or wheezing. Denies chest pains, palpitations and leg swelling Denies abdominal pain, nausea, vomiting,diarrhea or constipation.   Denies dysuria, frequency, hesitancy or incontinence. Denies joint pain, swelling and limitation in mobility. Denies headaches, seizures, numbness, or tingling. Denies depression, anxiety or insomnia. Denies skin break down or rash.   PE  BP 130/88   Pulse 80   Resp 12   Ht 5\' 5"  (6.712 m)   Wt 296 lb 1.9 oz (134.3 kg)   SpO2 98%   BMI 49.28 kg/m   Patient alert and oriented and in no cardiopulmonary distress.  HEENT: No facial asymmetry, EOMI,   oropharynx pink and moist.  Neck supple no JVD, no mass.  Chest: Clear to auscultation bilaterally.  CVS: S1, S2 no murmurs, no S3.Regular rate.  ABD: Soft non tender.   Ext: No edema  MS: Adequate ROM spine, shoulders, hips and knees.  Skin: Intact, no ulcerations or rash noted.  Psych: Good eye contact, normal affect. Memory intact not anxious or depressed appearing.  CNS: CN 2-12 intact, power,  normal throughout.no focal deficits noted.   Assessment & Plan  Malignant hypertension Controlled, no change in medication DASH diet and commitment to daily physical activity for a minimum of  30 minutes discussed and encouraged, as a part of hypertension management. The importance of attaining a healthy weight is also discussed.  BP/Weight 09/25/2017 10/29/2016 12/28/2015 07/08/2015 07/07/2015 07/03/2015 4/58/0998  Systolic BP 338 250 539 767 - 341 937  Diastolic BP 88 902 82 56 - 88 82  Wt. (Lbs) 296.12 299 293 - 291.25 291.25 303  BMI 49.28 49.76 48.76 - 48.47 48.47 50.42       Morbid obesity Unchanged. Patient re-educated about  the importance of commitment to a  minimum of 150 minutes of exercise per week.  The importance of healthy food choices with portion control discussed. Encouraged to start a food diary, count calories and to consider  joining a support group. Sample diet sheets offered. Goals set by the patient for the next several months.   Weight /BMI 09/25/2017 10/29/2016 12/28/2015  WEIGHT 296 lb 1.9 oz 299 lb 293 lb  HEIGHT 5\' 5"  5\' 5"  5\' 5"   BMI 49.28 kg/m2 49.76 kg/m2 48.76 kg/m2      IDA (iron deficiency anemia) Updated lab needed .   IGT (impaired glucose tolerance) Updated lab needed  Patient educated about the importance of limiting  Carbohydrate intake , the need to commit to daily physical activity for a minimum of 30 minutes , and to commit weight loss. The fact that changes in all these areas will reduce or eliminate all together the development of diabetes is stressed.   Diabetic Labs Latest Ref Rng & Units 10/29/2016 10/25/2016 07/03/2015 05/25/2015 05/23/2015  HbA1c <5.7 % -  5.8(H) - - 6.3(H)  Microalbumin Not estab mg/dL 1.2 - - 1.1 -  Micro/Creat Ratio <30 mcg/mg creat 6 - - 13 -  Chol <200 mg/dL - 140 - - -  HDL >50 mg/dL - 54 - - -  Calc LDL <100 mg/dL - 70 - - -  Triglycerides <150 mg/dL - 82 - - -  Creatinine 0.50 - 1.10 mg/dL - 1.42(H) 1.51(H) - 1.32(H)   BP/Weight 09/25/2017 10/29/2016 12/28/2015 07/08/2015 07/07/2015 07/03/2015 0/35/2481  Systolic BP 859 093 112 162 - 446 950  Diastolic BP 88 722 82 56 - 88 82  Wt. (Lbs) 296.12 299 293 - 291.25  291.25 303  BMI 49.28 49.76 48.76 - 48.47 48.47 50.42   Foot/eye exam completion dates Latest Ref Rng & Units 02/21/2015 11/02/2013  Eye Exam No Retinopathy - -  Foot exam Order - - -  Foot Form Completion - Done Done       Hyperlipidemia LDL goal <100 Hyperlipidemia:Low fat diet discussed and encouraged.   Lipid Panel  Lab Results  Component Value Date   CHOL 140 10/25/2016   HDL 54 10/25/2016   LDLCALC 70 10/25/2016   TRIG 82 10/25/2016   CHOLHDL 2.6 10/25/2016   Updated lab needed      CKD (chronic kidney disease) stage 3, GFR 30-59 ml/min Updated lab needed stressed the need to avoid NSAIDS and control blood pressure , also focus on plant based diet exercise and weight loss

## 2017-09-27 NOTE — Assessment & Plan Note (Signed)
Hyperlipidemia:Low fat diet discussed and encouraged.   Lipid Panel  Lab Results  Component Value Date   CHOL 140 10/25/2016   HDL 54 10/25/2016   LDLCALC 70 10/25/2016   TRIG 82 10/25/2016   CHOLHDL 2.6 10/25/2016   Updated lab needed

## 2017-09-27 NOTE — Assessment & Plan Note (Signed)
Unchanged. Patient re-educated about  the importance of commitment to a  minimum of 150 minutes of exercise per week.  The importance of healthy food choices with portion control discussed. Encouraged to start a food diary, count calories and to consider  joining a support group. Sample diet sheets offered. Goals set by the patient for the next several months.   Weight /BMI 09/25/2017 10/29/2016 12/28/2015  WEIGHT 296 lb 1.9 oz 299 lb 293 lb  HEIGHT 5\' 5"  5\' 5"  5\' 5"   BMI 49.28 kg/m2 49.76 kg/m2 48.76 kg/m2

## 2017-09-27 NOTE — Assessment & Plan Note (Signed)
Updated lab needed  Patient educated about the importance of limiting  Carbohydrate intake , the need to commit to daily physical activity for a minimum of 30 minutes , and to commit weight loss. The fact that changes in all these areas will reduce or eliminate all together the development of diabetes is stressed.   Diabetic Labs Latest Ref Rng & Units 10/29/2016 10/25/2016 07/03/2015 05/25/2015 05/23/2015  HbA1c <5.7 % - 5.8(H) - - 6.3(H)  Microalbumin Not estab mg/dL 1.2 - - 1.1 -  Micro/Creat Ratio <30 mcg/mg creat 6 - - 13 -  Chol <200 mg/dL - 140 - - -  HDL >50 mg/dL - 54 - - -  Calc LDL <100 mg/dL - 70 - - -  Triglycerides <150 mg/dL - 82 - - -  Creatinine 0.50 - 1.10 mg/dL - 1.42(H) 1.51(H) - 1.32(H)   BP/Weight 09/25/2017 10/29/2016 12/28/2015 07/08/2015 07/07/2015 07/03/2015 05/28/4740  Systolic BP 595 638 756 433 - 295 188  Diastolic BP 88 416 82 56 - 88 82  Wt. (Lbs) 296.12 299 293 - 291.25 291.25 303  BMI 49.28 49.76 48.76 - 48.47 48.47 50.42   Foot/eye exam completion dates Latest Ref Rng & Units 02/21/2015 11/02/2013  Eye Exam No Retinopathy - -  Foot exam Order - - -  Foot Form Completion - Done Done

## 2017-11-08 ENCOUNTER — Other Ambulatory Visit: Payer: Self-pay | Admitting: Family Medicine

## 2017-11-08 DIAGNOSIS — I10 Essential (primary) hypertension: Secondary | ICD-10-CM

## 2018-10-01 ENCOUNTER — Ambulatory Visit: Payer: 59 | Admitting: Family Medicine

## 2018-10-05 ENCOUNTER — Other Ambulatory Visit: Payer: Self-pay | Admitting: Family Medicine

## 2018-10-05 DIAGNOSIS — I10 Essential (primary) hypertension: Secondary | ICD-10-CM

## 2018-10-06 ENCOUNTER — Other Ambulatory Visit: Payer: Self-pay

## 2018-10-06 ENCOUNTER — Telehealth (INDEPENDENT_AMBULATORY_CARE_PROVIDER_SITE_OTHER): Payer: 59 | Admitting: Family Medicine

## 2018-10-06 ENCOUNTER — Encounter: Payer: Self-pay | Admitting: Family Medicine

## 2018-10-06 VITALS — BP 139/72 | Ht 65.0 in | Wt 301.0 lb

## 2018-10-06 DIAGNOSIS — E785 Hyperlipidemia, unspecified: Secondary | ICD-10-CM

## 2018-10-06 DIAGNOSIS — I10 Essential (primary) hypertension: Secondary | ICD-10-CM

## 2018-10-06 DIAGNOSIS — R7302 Impaired glucose tolerance (oral): Secondary | ICD-10-CM | POA: Diagnosis not present

## 2018-10-06 DIAGNOSIS — E559 Vitamin D deficiency, unspecified: Secondary | ICD-10-CM

## 2018-10-06 MED ORDER — AMLODIPINE BESYLATE 10 MG PO TABS: 10.0000 mg | ORAL_TABLET | Freq: Every day | ORAL | 11 refills | Status: DC

## 2018-10-06 MED ORDER — CLONIDINE HCL 0.2 MG PO TABS: 0.2000 mg | ORAL_TABLET | Freq: Every day | ORAL | 11 refills | Status: DC

## 2018-10-06 MED ORDER — TRIAMTERENE-HCTZ 37.5-25 MG PO TABS: 1.0000 | ORAL_TABLET | Freq: Every day | ORAL | 11 refills | Status: DC

## 2018-10-06 MED ORDER — ATORVASTATIN CALCIUM 10 MG PO TABS: 10.0000 mg | ORAL_TABLET | Freq: Every day | ORAL | 11 refills | Status: DC

## 2018-10-06 NOTE — Progress Notes (Signed)
Virtual Visit via Telephone Note  I connected with Christina Davenport on 10/06/18 at  4:00 PM EDT by telephone and verified that I am speaking with the correct person using two identifiers.  Location: Patient: home Provider: office  I discussed the limitations, risks, security and privacy concerns of performing an evaluation and management service by telephone and the availability of in person appointments. I also discussed with the patient that there may be a patient responsible charge related to this service. The patient expressed understanding and agreed to proceed.   History of Present Illness: F/u chronic medical problems and update health maintenance C/o fatigue of home confinement, lac and weight gain, invested in a treadmill and has lost 15 pounds in the past 7 months Will schedule pelvic and mammogram with gyne and will get flu vaccine at pharmacy as he reports doin last year   Observations/Objective: BP 139/72   Ht 5\' 5"  (1.651 m)   Wt (!) 301 lb (136.5 kg)   BMI 50.09 kg/m ' Good communication with no confusion and intact memory. Alert and oriented x 3 No signs of respiratory distress during speech   Assessment and Plan: Malignant hypertension Controlled, no change in medication DASH diet and commitment to daily physical activity for a minimum of 30 minutes discussed and encouraged, as a part of hypertension management. The importance of attaining a healthy weight is also discussed.  BP/Weight 10/06/2018 09/25/2017 10/29/2016 12/28/2015 07/08/2015 07/03/3265 02/26/4578  Systolic BP 998 338 250 539 767 - 341  Diastolic BP 72 88 937 82 56 - 88  Wt. (Lbs) 301 296.12 299 293 - 291.25 291.25  BMI 50.09 49.28 49.76 48.76 - 48.47 48.47       Morbid obesity  Patient re-educated about  the importance of commitment to a  minimum of 150 minutes of exercise per week as able.  The importance of healthy food choices with portion control discussed, as well as eating regularly and  within a 12 hour window most days. The need to choose "clean , green" food 50 to 75% of the time is discussed, as well as to make water the primary drink and set a goal of 64 ounces water daily.    Weight /BMI 10/06/2018 09/25/2017 10/29/2016  WEIGHT 301 lb 296 lb 1.9 oz 299 lb  HEIGHT 5\' 5"  5\' 5"  5\' 5"   BMI 50.09 kg/m2 49.28 kg/m2 49.76 kg/m2      Hyperlipidemia LDL goal <100 Hyperlipidemia:Low fat diet discussed and encouraged.   Lipid Panel  Lab Results  Component Value Date   CHOL 140 10/25/2016   HDL 54 10/25/2016   LDLCALC 70 10/25/2016   TRIG 82 10/25/2016   CHOLHDL 2.6 10/25/2016   Updated lab needed at/ before next visit.     IGT (impaired glucose tolerance) Patient educated about the importance of limiting  Carbohydrate intake , the need to commit to daily physical activity for a minimum of 30 minutes , and to commit weight loss. The fact that changes in all these areas will reduce or eliminate all together the development of diabetes is stressed.  Updated lab needed at/ before next visit.   Diabetic Labs Latest Ref Rng & Units 10/29/2016 10/25/2016 07/03/2015 05/25/2015 05/23/2015  HbA1c <5.7 % - 5.8(H) - - 6.3(H)  Microalbumin Not estab mg/dL 1.2 - - 1.1 -  Micro/Creat Ratio <30 mcg/mg creat 6 - - 13 -  Chol <200 mg/dL - 140 - - -  HDL >50 mg/dL - 54 - - -  Calc LDL <100 mg/dL - 70 - - -  Triglycerides <150 mg/dL - 82 - - -  Creatinine 0.50 - 1.10 mg/dL - 1.42(H) 1.51(H) - 1.32(H)   BP/Weight 10/06/2018 09/25/2017 10/29/2016 12/28/2015 07/08/2015 3/76/2831 06/26/7614  Systolic BP 073 710 626 948 546 - 270  Diastolic BP 72 88 350 82 56 - 88  Wt. (Lbs) 301 296.12 299 293 - 291.25 291.25  BMI 50.09 49.28 49.76 48.76 - 48.47 48.47   Foot/eye exam completion dates Latest Ref Rng & Units 02/21/2015 11/02/2013  Eye Exam No Retinopathy - -  Foot exam Order - - -  Foot Form Completion - Done Done        Follow Up Instructions:    I discussed the assessment and treatment  plan with the patient. The patient was provided an opportunity to ask questions and all were answered. The patient agreed with the plan and demonstrated an understanding of the instructions.   The patient was advised to call back or seek an in-person evaluation if the symptoms worsen or if the condition fails to improve as anticipated.  I provided 22 minutes of non-face-to-face time during this encounter.   Tula Nakayama, MD

## 2018-10-06 NOTE — Assessment & Plan Note (Signed)
  Patient re-educated about  the importance of commitment to a  minimum of 150 minutes of exercise per week as able.  The importance of healthy food choices with portion control discussed, as well as eating regularly and within a 12 hour window most days. The need to choose "clean , green" food 50 to 75% of the time is discussed, as well as to make water the primary drink and set a goal of 64 ounces water daily.    Weight /BMI 10/06/2018 09/25/2017 10/29/2016  WEIGHT 301 lb 296 lb 1.9 oz 299 lb  HEIGHT 5\' 5"  5\' 5"  5\' 5"   BMI 50.09 kg/m2 49.28 kg/m2 49.76 kg/m2

## 2018-10-06 NOTE — Assessment & Plan Note (Signed)
Hyperlipidemia:Low fat diet discussed and encouraged.   Lipid Panel  Lab Results  Component Value Date   CHOL 140 10/25/2016   HDL 54 10/25/2016   LDLCALC 70 10/25/2016   TRIG 82 10/25/2016   CHOLHDL 2.6 10/25/2016   Updated lab needed at/ before next visit.

## 2018-10-06 NOTE — Assessment & Plan Note (Signed)
Controlled, no change in medication DASH diet and commitment to daily physical activity for a minimum of 30 minutes discussed and encouraged, as a part of hypertension management. The importance of attaining a healthy weight is also discussed.  BP/Weight 10/06/2018 09/25/2017 10/29/2016 12/28/2015 07/08/2015 8/37/2902 02/25/1550  Systolic BP 080 223 361 224 497 - 530  Diastolic BP 72 88 051 82 56 - 88  Wt. (Lbs) 301 296.12 299 293 - 291.25 291.25  BMI 50.09 49.28 49.76 48.76 - 48.47 48.47

## 2018-10-06 NOTE — Assessment & Plan Note (Signed)
Patient educated about the importance of limiting  Carbohydrate intake , the need to commit to daily physical activity for a minimum of 30 minutes , and to commit weight loss. The fact that changes in all these areas will reduce or eliminate all together the development of diabetes is stressed.  Updated lab needed at/ before next visit.   Diabetic Labs Latest Ref Rng & Units 10/29/2016 10/25/2016 07/03/2015 05/25/2015 05/23/2015  HbA1c <5.7 % - 5.8(H) - - 6.3(H)  Microalbumin Not estab mg/dL 1.2 - - 1.1 -  Micro/Creat Ratio <30 mcg/mg creat 6 - - 13 -  Chol <200 mg/dL - 140 - - -  HDL >50 mg/dL - 54 - - -  Calc LDL <100 mg/dL - 70 - - -  Triglycerides <150 mg/dL - 82 - - -  Creatinine 0.50 - 1.10 mg/dL - 1.42(H) 1.51(H) - 1.32(H)   BP/Weight 10/06/2018 09/25/2017 10/29/2016 12/28/2015 07/08/2015 07/13/8414 6/0/6301  Systolic BP 601 093 235 573 220 - 254  Diastolic BP 72 88 270 82 56 - 88  Wt. (Lbs) 301 296.12 299 293 - 291.25 291.25  BMI 50.09 49.28 49.76 48.76 - 48.47 48.47   Foot/eye exam completion dates Latest Ref Rng & Units 02/21/2015 11/02/2013  Eye Exam No Retinopathy - -  Foot exam Order - - -  Foot Form Completion - Done Done

## 2018-10-06 NOTE — Patient Instructions (Addendum)
F/U with MD in 6 months, call if you need me before   PLEASE get fasting labs this week, CBC, lipid, cmp and EGFR, hBA1C, TSH and vit D ( order is faxed to [facility] lab) Please remember you missed labs last year and this is dangerous, so this week is the deal  Schedule mammogram and gyne exam as discusssed  Pls ask [facility] to notify us when you get flu vaccine  It is important that you exercise regularly at least 30 minutes 5 times a week. If you develop chest pain, have severe difficulty breathing, or feel very tired, stop exercising immediately and seek medical attention    Think about what you will eat, plan ahead. Choose " clean, green, fresh or frozen" over canned, processed or packaged foods which are more sugary, salty and fatty. 70 to 75% of food eaten should be vegetables and fruit. Three meals at set times with snacks allowed between meals, but they must be fruit or vegetables. Aim to eat over a 12 hour period , example 7 am to 7 pm, and STOP after  your last meal of the day. Drink water,generally about 64 ounces per day, no other drink is as healthy. Fruit juice is best enjoyed in a healthy way, by EATING the fruit.  Social distancing. Frequent hand washing with soap and water Keeping your hands off of your face. These 3 practices will help to keep both you and your community healthy during this time. Please practice them faithfully!

## 2018-10-13 ENCOUNTER — Encounter: Payer: Self-pay | Admitting: Family Medicine

## 2018-10-13 ENCOUNTER — Telehealth: Payer: Self-pay

## 2018-10-13 DIAGNOSIS — R7302 Impaired glucose tolerance (oral): Secondary | ICD-10-CM

## 2018-10-13 DIAGNOSIS — I10 Essential (primary) hypertension: Secondary | ICD-10-CM

## 2018-10-13 LAB — HEMOGLOBIN A1C
Hgb A1c MFr Bld: 6.5 % of total Hgb — ABNORMAL HIGH (ref <5.7)
Mean Plasma Glucose: 140 (calc)
eAG (mmol/L): 7.7 (calc)

## 2018-10-13 LAB — COMPLETE METABOLIC PANEL WITH GFR
AG Ratio: 1.3 (calc) (ref 1.0–2.5)
ALT: 13 U/L (ref 6–29)
AST: 16 U/L (ref 10–35)
Albumin: 4.1 g/dL (ref 3.6–5.1)
Alkaline phosphatase (APISO): 40 U/L (ref 31–125)
BUN/Creatinine Ratio: 17 (calc) (ref 6–22)
BUN: 24 mg/dL (ref 7–25)
CO2: 32 mmol/L (ref 20–32)
Calcium: 9.9 mg/dL (ref 8.6–10.2)
Chloride: 98 mmol/L (ref 98–110)
Creat: 1.44 mg/dL — ABNORMAL HIGH (ref 0.50–1.10)
GFR, Est African American: 50 mL/min/{1.73_m2} — ABNORMAL LOW (ref >=60)
GFR, Est Non African American: 43 mL/min/{1.73_m2} — ABNORMAL LOW (ref >=60)
Globulin: 3.1 g/dL (calc) (ref 1.9–3.7)
Glucose, Bld: 134 mg/dL — ABNORMAL HIGH (ref 65–99)
Potassium: 4.1 mmol/L (ref 3.5–5.3)
Sodium: 140 mmol/L (ref 135–146)
Total Bilirubin: 0.5 mg/dL (ref 0.2–1.2)
Total Protein: 7.2 g/dL (ref 6.1–8.1)

## 2018-10-13 LAB — CBC
HCT: 38.2 % (ref 35.0–45.0)
Hemoglobin: 12.4 g/dL (ref 11.7–15.5)
MCH: 27.4 pg (ref 27.0–33.0)
MCHC: 32.5 g/dL (ref 32.0–36.0)
MCV: 84.5 fL (ref 80.0–100.0)
MPV: 9.9 fL (ref 7.5–12.5)
Platelets: 328 10*3/uL (ref 140–400)
RBC: 4.52 10*6/uL (ref 3.80–5.10)
RDW: 13.2 % (ref 11.0–15.0)
WBC: 6.7 10*3/uL (ref 3.8–10.8)

## 2018-10-13 LAB — TSH: TSH: 1.93 mIU/L

## 2018-10-13 LAB — LIPID PANEL
Cholesterol: 150 mg/dL (ref <200)
HDL: 54 mg/dL (ref >=50)
LDL Cholesterol (Calc): 78 mg/dL (calc)
Non-HDL Cholesterol (Calc): 96 mg/dL (calc) (ref <130)
Total CHOL/HDL Ratio: 2.8 (calc) (ref <5.0)
Triglycerides: 93 mg/dL (ref <150)

## 2018-10-13 LAB — VITAMIN D 25 HYDROXY (VIT D DEFICIENCY, FRACTURES): Vit D, 25-Hydroxy: 42 ng/mL (ref 30–100)

## 2018-10-13 NOTE — Telephone Encounter (Signed)
Labs ordered to be drawn 1st week of Feb 2021

## 2018-10-19 ENCOUNTER — Other Ambulatory Visit: Payer: Self-pay | Admitting: Family Medicine

## 2018-10-19 DIAGNOSIS — E785 Hyperlipidemia, unspecified: Secondary | ICD-10-CM

## 2018-10-19 DIAGNOSIS — I10 Essential (primary) hypertension: Secondary | ICD-10-CM

## 2019-03-03 LAB — HM DIABETES EYE EXAM

## 2019-04-07 ENCOUNTER — Ambulatory Visit: Payer: 59 | Admitting: Family Medicine

## 2019-04-08 ENCOUNTER — Other Ambulatory Visit: Payer: Self-pay

## 2019-04-08 ENCOUNTER — Encounter: Payer: Self-pay | Admitting: Family Medicine

## 2019-04-08 ENCOUNTER — Ambulatory Visit (INDEPENDENT_AMBULATORY_CARE_PROVIDER_SITE_OTHER): Payer: 59 | Admitting: Family Medicine

## 2019-04-08 VITALS — BP 140/96 | HR 104 | Temp 97.5°F | Resp 15 | Ht 65.0 in | Wt 304.1 lb

## 2019-04-08 DIAGNOSIS — E785 Hyperlipidemia, unspecified: Secondary | ICD-10-CM

## 2019-04-08 DIAGNOSIS — G510 Bell's palsy: Secondary | ICD-10-CM

## 2019-04-08 DIAGNOSIS — R7303 Prediabetes: Secondary | ICD-10-CM | POA: Diagnosis not present

## 2019-04-08 DIAGNOSIS — H509 Unspecified strabismus: Secondary | ICD-10-CM

## 2019-04-08 DIAGNOSIS — I10 Essential (primary) hypertension: Secondary | ICD-10-CM

## 2019-04-08 DIAGNOSIS — E1169 Type 2 diabetes mellitus with other specified complication: Secondary | ICD-10-CM | POA: Insufficient documentation

## 2019-04-08 MED ORDER — CLONIDINE HCL 0.2 MG PO TABS: ORAL_TABLET | ORAL | 6 refills | Status: DC

## 2019-04-08 MED ORDER — ATORVASTATIN CALCIUM 10 MG PO TABS: ORAL_TABLET | ORAL | 6 refills | Status: DC

## 2019-04-08 MED ORDER — TRIAMTERENE-HCTZ 37.5-25 MG PO TABS: 1.0000 | ORAL_TABLET | Freq: Every day | ORAL | 11 refills | Status: DC

## 2019-04-08 MED ORDER — AMLODIPINE BESYLATE 10 MG PO TABS: 10.0000 mg | ORAL_TABLET | Freq: Every day | ORAL | 11 refills | Status: DC

## 2019-04-08 NOTE — Patient Instructions (Addendum)
Happy New Year! May you have a year filled with hope, love, happiness and laughter.  I appreciate the opportunity to provide you with care for your health and wellness. Today we discussed: overall health  Follow up: Oct for annual with pap with Dr Guadalupe Maple after gamma knife surgery   Please continue to practice social distancing to keep you, your family, and our community safe.  If you must go out, please wear a mask and practice good handwashing.  It was a pleasure to see you and I look forward to continuing to work together on your health and well-being. Please do not hesitate to call the office if you need care or have questions about your care.  Have a wonderful day and week. With Gratitude, Cherly Beach, DNP, AGNP-BC

## 2019-04-08 NOTE — Assessment & Plan Note (Addendum)
Christina Davenport is encouraged to maintain a well balanced diet that is low in salt. Controlled at home. 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 Refills provided   Additionally, she is also reminded that exercise is beneficial for heart health and control of  Blood pressure. 30-60 minutes daily is recommended-walking was suggested.

## 2019-04-08 NOTE — Progress Notes (Signed)
Subjective:  Patient ID: Christina Davenport, female    DOB: 07/12/1970  Age: 49 y.o. MRN: [id number]  CC:  Chief Complaint  Patient presents with  . Hypertension    bp follow up.       HPI  HPI  Ms Hromadka is here today to follow-up with blood pressure.  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  She reports that she is not eating the best.  And is not currently working out.  But hopes to get healthier after this neck surgery.  The tumor is described as the same that she previously had it is not thought to be cancerous.  She reports having white coat syndrome.  And says that her blood pressures at home are very well controlled with readings of 120/70.  Today patient denies signs and symptoms of COVID 19 infection including fever, chills, cough, shortness of breath, and headache. Past Medical, Surgical, Social History, Allergies, and Medications have been Reviewed.   Past Medical History:  Diagnosis Date  . Brain tumor (benign) ([place]) 2011   excision followed by radiation  . Diabetes mellitus type 2 in obese ([place]) 12/09/2007   Qualifier: Diagnosis of  By: Moshe Cipro MD, Joycelyn Schmid    . Diabetes mellitus, type 2 (HCC)    diet controlled  . Endometrial thickening on ultra sound 01/08/2012   Reports had pelvic exam in 2015 will send for record   . 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 Hypertension   . Obesity   . Ovarian cyst 01/08/2012  . PCO (polycystic ovaries)   . S/P right oophorectomy 07/07/2015  . 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 acetaminophen (TYLENOL) 500 MG tablet Take 500 mg by mouth every 6 (six) hours as needed for mild pain.  Marland Kitchen amLODipine (NORVASC) 10 MG tablet Take 1 tablet (10 mg total) by mouth daily.  Marland Kitchen atorvastatin (LIPITOR) 10 MG tablet TAKE 1 TABLET(10 MG) BY MOUTH DAILY  . atorvastatin (LIPITOR) 10 MG tablet Take by mouth.  . cloNIDine (CATAPRES) 0.2 MG tablet TAKE 1 TABLET(0.2 MG) BY MOUTH AT BEDTIME  . triamterene-hydrochlorothiazide (MAXZIDE-25) 37.5-25 MG tablet Take 1 tablet by mouth daily.  . [DISCONTINUED] amLODipine (NORVASC) 10 MG tablet Take 1 tablet (10 mg total) by mouth daily.  . [DISCONTINUED] atorvastatin (LIPITOR) 10 MG tablet TAKE 1 TABLET(10 MG) BY MOUTH DAILY  . [DISCONTINUED] cloNIDine (CATAPRES) 0.2 MG tablet TAKE 1 TABLET(0.2 MG) BY MOUTH AT BEDTIME  . [DISCONTINUED] triamterene-hydrochlorothiazide (MAXZIDE-25) 37.5-25 MG tablet Take 1 tablet by mouth daily.    ROS:  Review of Systems  Constitutional: Negative.   HENT: Negative.   Eyes: Positive for blurred vision, double vision and pain.  Respiratory: Negative.   Cardiovascular: Negative.   Gastrointestinal: Negative.   Genitourinary: Negative.   Musculoskeletal: Negative.   Skin: Negative.   Neurological: Positive for headaches.  Endo/Heme/Allergies: Negative.   Psychiatric/Behavioral: Negative.   All other systems reviewed and  are negative.    Objective:   Today's Vitals: BP (!) 140/96   Pulse (!) 104   Temp (!) 97.5 F (36.4 C) (Temporal)   Resp 15   Ht 5\' 5"  (1.651 m)   Wt (!) 304 lb 1.3 oz (137.9 kg)   SpO2 96%   BMI 50.60 kg/m  Vitals with BMI 04/08/2019 04/08/2019 10/06/2018  Height - 5\' 5"  5\' 5"   Weight - 304 lbs 1 oz 301 lbs  BMI - [id number]  Systolic [id number]  Diastolic 96 96 72  Pulse - 104 -     Physical Exam Vitals and nursing note reviewed.  Constitutional:      Appearance: Normal appearance. She is well-developed and well-groomed. She is morbidly  obese.  HENT:     Head: Normocephalic and atraumatic.     Comments: Mask in place      Right Ear: External ear normal.     Left Ear: External ear normal.  Eyes:     General: Lids are normal. Visual field deficit present.        Right eye: No discharge.        Left eye: No discharge.     Extraocular Movements:     Left eye: Abnormal extraocular motion present.     Conjunctiva/sclera: Conjunctivae normal.     Comments: Strabismus of Left eye  Cardiovascular:     Rate and Rhythm: Normal rate and regular rhythm.     Pulses: Normal pulses.     Heart sounds: Normal heart sounds.  Pulmonary:     Effort: Pulmonary effort is normal.     Breath sounds: Normal breath sounds.  Musculoskeletal:        General: Normal range of motion.     Cervical back: Normal range of motion and neck supple.  Skin:    General: Skin is warm.  Neurological:     General: No focal deficit present.     Mental Status: She is alert and oriented to person, place, and time.  Psychiatric:        Attention and Perception: Attention normal.        Mood and Affect: Mood normal.        Speech: Speech normal.        Behavior: Behavior normal. Behavior is cooperative.        Thought Content: Thought content normal.        Cognition and Memory: Cognition normal.        Judgment: Judgment normal.      Assessment   1. Malignant hypertension   2. Prediabetes   3. Hyperlipidemia LDL goal <100   4. Paralysis of one side of face   5. Strabismus     Tests ordered Orders Placed This Encounter  Procedures  . Hemoglobin A1c  . BASIC METABOLIC PANEL WITH GFR  . CBC     Plan: Please see assessment and plan per problem list above.   Meds ordered this encounter  Medications  . amLODipine (NORVASC) 10 MG tablet    Sig: Take 1 tablet (10 mg total) by mouth daily.    Dispense:  30 tablet    Refill:  11    Order Specific Question:   Supervising Provider    Answer:   Tula Nakayama E [id number]  . atorvastatin  (LIPITOR) 10 MG tablet    Sig: TAKE 1 TABLET(10 MG) BY MOUTH DAILY    Dispense:  30 tablet    Refill:  6  Order Specific Question:   Supervising Provider    Answer:   Fayrene Helper [id number]  . cloNIDine (CATAPRES) 0.2 MG tablet    Sig: TAKE 1 TABLET(0.2 MG) BY MOUTH AT BEDTIME    Dispense:  30 tablet    Refill:  6    Order Specific Question:   Supervising Provider    Answer:   SIMPSON, MARGARET E [id number]  . triamterene-hydrochlorothiazide (MAXZIDE-25) 37.5-25 MG tablet    Sig: Take 1 tablet by mouth daily.    Dispense:  30 tablet    Refill:  11    Must keep tomorrow's appt for future refills    Order Specific Question:   Supervising Provider    Answer:   Fayrene Helper [id number]    Patient to follow-up in Visit date not found   Perlie Mayo, NP

## 2019-04-08 NOTE — Assessment & Plan Note (Signed)
A1c up to 6.5%.  Is willing to have this rechecked after her upcoming gamma knife surgery at the beginning of April. She is encouraged to make sure that she maintains a heart healthy, low carbohydrate low-salt diet.  And to ambulate at least 30 minutes on 5 days of the week if not more.

## 2019-04-08 NOTE — Assessment & Plan Note (Signed)
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  Is going to have gamma knife surgery on April 1 to help correct this.

## 2019-04-08 NOTE — Assessment & Plan Note (Signed)
From previous brain tumor, stable at this time.

## 2019-10-01 ENCOUNTER — Other Ambulatory Visit: Payer: Self-pay | Admitting: Family Medicine

## 2019-10-01 DIAGNOSIS — I10 Essential (primary) hypertension: Secondary | ICD-10-CM

## 2019-10-30 ENCOUNTER — Other Ambulatory Visit: Payer: Self-pay | Admitting: Family Medicine

## 2019-10-30 DIAGNOSIS — I10 Essential (primary) hypertension: Secondary | ICD-10-CM

## 2019-11-25 ENCOUNTER — Other Ambulatory Visit: Payer: Self-pay | Admitting: Family Medicine

## 2019-11-25 DIAGNOSIS — E785 Hyperlipidemia, unspecified: Secondary | ICD-10-CM

## 2019-12-22 ENCOUNTER — Ambulatory Visit (INDEPENDENT_AMBULATORY_CARE_PROVIDER_SITE_OTHER): Payer: 59 | Admitting: Family Medicine

## 2019-12-22 ENCOUNTER — Other Ambulatory Visit: Payer: Self-pay

## 2019-12-22 ENCOUNTER — Encounter: Payer: Self-pay | Admitting: Family Medicine

## 2019-12-22 ENCOUNTER — Other Ambulatory Visit: Payer: Self-pay | Admitting: Family Medicine

## 2019-12-22 VITALS — BP 156/100 | HR 100 | Resp 16 | Ht 65.0 in | Wt 311.0 lb

## 2019-12-22 DIAGNOSIS — E1169 Type 2 diabetes mellitus with other specified complication: Secondary | ICD-10-CM

## 2019-12-22 DIAGNOSIS — F321 Major depressive disorder, single episode, moderate: Secondary | ICD-10-CM | POA: Diagnosis not present

## 2019-12-22 DIAGNOSIS — I10 Essential (primary) hypertension: Secondary | ICD-10-CM

## 2019-12-22 DIAGNOSIS — Z23 Encounter for immunization: Secondary | ICD-10-CM | POA: Diagnosis not present

## 2019-12-22 DIAGNOSIS — E785 Hyperlipidemia, unspecified: Secondary | ICD-10-CM | POA: Diagnosis not present

## 2019-12-22 DIAGNOSIS — E559 Vitamin D deficiency, unspecified: Secondary | ICD-10-CM

## 2019-12-22 DIAGNOSIS — R7303 Prediabetes: Secondary | ICD-10-CM

## 2019-12-22 MED ORDER — ATORVASTATIN CALCIUM 10 MG PO TABS: ORAL_TABLET | ORAL | 6 refills | Status: DC

## 2019-12-22 MED ORDER — AMLODIPINE BESYLATE-VALSARTAN 10-320 MG PO TABS: 1.0000 | ORAL_TABLET | Freq: Every day | ORAL | 1 refills | Status: DC

## 2019-12-22 MED ORDER — CLONIDINE HCL 0.2 MG PO TABS: 0.2000 mg | ORAL_TABLET | Freq: Every day | ORAL | 1 refills | Status: DC

## 2019-12-22 MED ORDER — CLONIDINE HCL 0.2 MG PO TABS: ORAL_TABLET | ORAL | 6 refills | Status: DC

## 2019-12-22 MED ORDER — AMLODIPINE-OLMESARTAN 10-40 MG PO TABS: 1.0000 | ORAL_TABLET | Freq: Every day | ORAL | 1 refills | Status: DC

## 2019-12-22 MED ORDER — TRIAMTERENE-HCTZ 75-50 MG PO TABS: 1.0000 | ORAL_TABLET | Freq: Every day | ORAL | 1 refills | Status: DC

## 2019-12-22 MED ORDER — FLUOXETINE HCL 20 MG PO TABS: 20.0000 mg | ORAL_TABLET | Freq: Every day | ORAL | 3 refills | Status: DC

## 2019-12-22 NOTE — Patient Instructions (Signed)
F/u in office with MD in 6 weeks, re evaluate chronic medical conditions call if you need me sooner  Flu and Pneumonia 23 vaccines today  Labs today, cBC, lipid, cmp and eGFr, hBA1C, tSH, vit D and microalb  You are referred to Cardiology for evaluation of your heart and blood pressure  You will be referred to therapist for depression, and also start medication fluoxetine daily  [facility] are HERE for you, and will do all that we can to improve your health  Thanks for choosing [facility], we consider it a privelige to serve you.

## 2019-12-23 ENCOUNTER — Encounter: Payer: Self-pay | Admitting: Family Medicine

## 2019-12-23 ENCOUNTER — Other Ambulatory Visit: Payer: Self-pay | Admitting: Family Medicine

## 2019-12-23 LAB — COMPLETE METABOLIC PANEL WITH GFR
AG Ratio: 1.2 (calc) (ref 1.0–2.5)
ALT: 15 U/L (ref 6–29)
AST: 19 U/L (ref 10–35)
Albumin: 4 g/dL (ref 3.6–5.1)
Alkaline phosphatase (APISO): 39 U/L (ref 31–125)
BUN/Creatinine Ratio: 13 (calc) (ref 6–22)
BUN: 17 mg/dL (ref 7–25)
CO2: 29 mmol/L (ref 20–32)
Calcium: 9.4 mg/dL (ref 8.6–10.2)
Chloride: 98 mmol/L (ref 98–110)
Creat: 1.33 mg/dL — ABNORMAL HIGH (ref 0.50–1.10)
GFR, Est African American: 55 mL/min/{1.73_m2} — ABNORMAL LOW (ref >=60)
GFR, Est Non African American: 47 mL/min/{1.73_m2} — ABNORMAL LOW (ref >=60)
Globulin: 3.4 g/dL (calc) (ref 1.9–3.7)
Glucose, Bld: 108 mg/dL (ref 65–139)
Potassium: 3.7 mmol/L (ref 3.5–5.3)
Sodium: 137 mmol/L (ref 135–146)
Total Bilirubin: 0.5 mg/dL (ref 0.2–1.2)
Total Protein: 7.4 g/dL (ref 6.1–8.1)

## 2019-12-23 LAB — HEPATITIS C ANTIBODY
Hepatitis C Ab: NONREACTIVE
SIGNAL TO CUT-OFF: 0.02 (ref <1.00)

## 2019-12-23 LAB — LIPID PANEL
Cholesterol: 197 mg/dL (ref <200)
HDL: 55 mg/dL (ref >=50)
LDL Cholesterol (Calc): 118 mg/dL (calc) — ABNORMAL HIGH
Non-HDL Cholesterol (Calc): 142 mg/dL (calc) — ABNORMAL HIGH (ref <130)
Total CHOL/HDL Ratio: 3.6 (calc) (ref <5.0)
Triglycerides: 125 mg/dL (ref <150)

## 2019-12-23 LAB — CBC
HCT: 38.7 % (ref 35.0–45.0)
Hemoglobin: 12.6 g/dL (ref 11.7–15.5)
MCH: 27.6 pg (ref 27.0–33.0)
MCHC: 32.6 g/dL (ref 32.0–36.0)
MCV: 84.7 fL (ref 80.0–100.0)
MPV: 10.2 fL (ref 7.5–12.5)
Platelets: 326 10*3/uL (ref 140–400)
RBC: 4.57 10*6/uL (ref 3.80–5.10)
RDW: 13.4 % (ref 11.0–15.0)
WBC: 7.7 10*3/uL (ref 3.8–10.8)

## 2019-12-23 LAB — HEMOGLOBIN A1C
Hgb A1c MFr Bld: 6.6 % of total Hgb — ABNORMAL HIGH (ref <5.7)
Mean Plasma Glucose: 143 (calc)
eAG (mmol/L): 7.9 (calc)

## 2019-12-23 LAB — TEST AUTHORIZATION

## 2019-12-23 LAB — MICROALBUMIN / CREATININE URINE RATIO
Creatinine, Urine: 167 mg/dL (ref 20–275)
Microalb Creat Ratio: 37 mcg/mg creat — ABNORMAL HIGH (ref <30)
Microalb, Ur: 6.2 mg/dL

## 2019-12-23 LAB — TSH: TSH: 1.75 mIU/L

## 2019-12-23 LAB — VITAMIN D 25 HYDROXY (VIT D DEFICIENCY, FRACTURES): Vit D, 25-Hydroxy: 49 ng/mL (ref 30–100)

## 2019-12-23 MED ORDER — VALSARTAN 320 MG PO TABS: 320.0000 mg | ORAL_TABLET | Freq: Every day | ORAL | 3 refills | Status: DC

## 2019-12-23 MED ORDER — ATORVASTATIN CALCIUM 20 MG PO TABS: 20.0000 mg | ORAL_TABLET | Freq: Every day | ORAL | 3 refills | Status: DC

## 2019-12-23 MED ORDER — AMLODIPINE BESYLATE 10 MG PO TABS: 10.0000 mg | ORAL_TABLET | Freq: Every day | ORAL | 3 refills | Status: DC

## 2019-12-23 NOTE — Progress Notes (Signed)
Valsartan 320

## 2019-12-23 NOTE — Progress Notes (Signed)
Atorvastatin 20

## 2019-12-26 ENCOUNTER — Encounter: Payer: Self-pay | Admitting: Family Medicine

## 2019-12-26 DIAGNOSIS — F321 Major depressive disorder, single episode, moderate: Secondary | ICD-10-CM | POA: Insufficient documentation

## 2019-12-26 HISTORY — DX: Major depressive disorder, single episode, moderate: F32.1

## 2019-12-26 NOTE — Assessment & Plan Note (Signed)
Christina Davenport is reminded of the importance of commitment to daily physical activity for 30 minutes or more, as able and the need to limit carbohydrate intake to 30 to 60 grams per meal to help with blood sugar control.     Christina Davenport is reminded of the importance of daily foot exam, annual eye examination, and good blood sugar, blood pressure and cholesterol control.  Diabetic Labs Latest Ref Rng & Units 12/22/2019 10/12/2018 10/29/2016 10/25/2016 07/03/2015  HbA1c <5.7 % of total Hgb 6.6(H) 6.5(H) - 5.8(H) -  Microalbumin mg/dL 6.2 - 1.2 - -  Micro/Creat Ratio <30 mcg/mg creat 37(H) - 6 - -  Chol <200 mg/dL 197 150 - 140 -  HDL > OR = 50 mg/dL 55 54 - 54 -  Calc LDL mg/dL (calc) 118(H) 78 - 70 -  Triglycerides <150 mg/dL 125 93 - 82 -  Creatinine 0.50 - 1.10 mg/dL 1.33(H) 1.44(H) - 1.42(H) 1.51(H)   BP/Weight 12/22/2019 04/08/2019 10/06/2018 09/25/2017 10/29/2016 12/28/2015 3/71/6967  Systolic BP 893 810 175 102 585 277 824  Diastolic BP 235 96 72 88 361 82 56  Wt. (Lbs) 311 304.08 301 296.12 299 293 -  BMI 51.75 50.6 50.09 49.28 49.76 48.76 -   Foot/eye exam completion dates Latest Ref Rng & Units 02/21/2015 11/02/2013  Eye Exam No Retinopathy - -  Foot exam Order - - -  Foot Form Completion - Done Done

## 2019-12-26 NOTE — Assessment & Plan Note (Signed)
  Patient re-educated about  the importance of commitment to a  minimum of 150 minutes of exercise per week as able.  The importance of healthy food choices with portion control discussed, as well as eating regularly and within a 12 hour window most days. The need to choose "clean , green" food 50 to 75% of the time is discussed, as well as to make water the primary drink and set a goal of 64 ounces water daily.    Weight /BMI 12/22/2019 04/08/2019 10/06/2018  WEIGHT 311 lb 304 lb 1.3 oz 301 lb  HEIGHT 5\' 5"  5\' 5"  5\' 5"   BMI 51.75 kg/m2 50.6 kg/m2 50.09 kg/m2

## 2019-12-26 NOTE — Assessment & Plan Note (Signed)
DASH diet and commitment to daily physical activity for a minimum of 30 minutes discussed and encouraged, as a part of hypertension management. The importance of attaining a healthy weight is also discussed.  BP/Weight 12/22/2019 04/08/2019 10/06/2018 09/25/2017 10/29/2016 12/28/2015 4/44/6190  Systolic BP 122 241 146 431 427 670 110  Diastolic BP 034 96 72 88 961 82 56  Wt. (Lbs) 311 304.08 301 296.12 299 293 -  BMI 51.75 50.6 50.09 49.28 49.76 48.76 -   Uncontrolled and not at goal, refer to Cardiology , medications adjustment made based on reported meds currently taking which was later reviewed when she went home

## 2019-12-26 NOTE — Assessment & Plan Note (Signed)
Hyperlipidemia:Low fat diet discussed and encouraged.   Lipid Panel  Lab Results  Component Value Date   CHOL 197 12/22/2019   HDL 55 12/22/2019   LDLCALC 118 (H) 12/22/2019   TRIG 125 12/22/2019   CHOLHDL 3.6 12/22/2019   Uncontrolled , not at goal, needs to take lipitor 20 mg

## 2019-12-26 NOTE — Assessment & Plan Note (Signed)
Not suiciddal or homicidal Overwhelmed with health. Family makes her feel as though she is incapable of caring for herself  Overwhelemd. Recurrent brain tumor but is responding to treatment\Tearful  Sytart medication and refer for therapy

## 2019-12-26 NOTE — Progress Notes (Signed)
Christina Davenport     MRN: [id number]      DOB: 1970/05/23   HPI Christina Davenport is here for follow up and re-evaluation of chronic medical conditions, medication management and review of any available recent lab and radiology data.  Preventive health is updated, specifically  Cancer screening and Immunization.   Has had recurrent brain tumor, new site in January of this year, responsive to laser treatment, however has taken it's toll physically, emotionally and mentally on her . Overwhelmed, depressed , tearful. The PT denies any adverse reactions to current medications since the last visit.  Unfortunately has not brought medications, is uncertain of exact meds and blood pressure is elevated , which is not uncommon for her  ROS Denies recent fever or chills. Denies sinus pressure, nasal congestion, ear pain or sore throat. Denies chest congestion, productive cough or wheezing. Denies chest pains, palpitations and leg swelling Denies abdominal pain, nausea, vomiting,diarrhea or constipation.   Denies dysuria, frequency, hesitancy or incontinence. Denies joint pain, swelling and limitation in mobility. Denies skin break down or rash.   PE  BP (!) 156/100   Pulse 100   Resp 16   Ht 5\' 5"  (1.651 m)   Wt (!) 311 lb (141.1 kg)   SpO2 97%   BMI 51.75 kg/m   Patient alert and oriented and in no cardiopulmonary distress.  HEENT:  facial asymmetry, EOMI,     Neck supple . Partial ptosis of rigth eye wih periorbital swelling Chest: Clear to auscultation bilaterally.  CVS: S1, S2 no murmurs, no S3.Regular rate.  ABD: Soft non tender.   Ext: No edema  MS: Adequate ROM spine, shoulders, hips and knees.  Skin: Intact, no ulcerations or rash noted.  Psych: Good eye contact, tearful at times, flat affect. Memory intact  depressed appearing.  CNS: CN 2-12 intact, power,  normal throughout.no focal deficits noted.   Assessment & Plan  Depression, major, single episode, moderate  (HCC) Not suiciddal or homicidal Overwhelmed with health. Family makes her feel as though she is incapable of caring for herself  Overwhelemd. Recurrent brain tumor but is responding to treatment\Tearful  Sytart medication and refer for therapy  Malignant hypertension DASH diet and commitment to daily physical activity for a minimum of 30 minutes discussed and encouraged, as a part of hypertension management. The importance of attaining a healthy weight is also discussed.  BP/Weight 12/22/2019 04/08/2019 10/06/2018 09/25/2017 10/29/2016 12/28/2015 05/18/5571  Systolic BP 220 254 270 623 762 831 517  Diastolic BP 616 96 72 88 073 82 56  Wt. (Lbs) 311 304.08 301 296.12 299 293 -  BMI 51.75 50.6 50.09 49.28 49.76 48.76 -   Uncontrolled and not at goal, refer to Cardiology , medications adjustment made based on reported meds currently taking which was later reviewed when she went home    Morbid obesity  Patient re-educated about  the importance of commitment to a  minimum of 150 minutes of exercise per week as able.  The importance of healthy food choices with portion control discussed, as well as eating regularly and within a 12 hour window most days. The need to choose "clean , green" food 50 to 75% of the time is discussed, as well as to make water the primary drink and set a goal of 64 ounces water daily.    Weight /BMI 12/22/2019 04/08/2019 10/06/2018  WEIGHT 311 lb 304 lb 1.3 oz 301 lb  HEIGHT 5\' 5"  5\' 5"  5\' 5"   BMI  51.75 kg/m2 50.6 kg/m2 50.09 kg/m2      Hyperlipidemia LDL goal <100 Hyperlipidemia:Low fat diet discussed and encouraged.   Lipid Panel  Lab Results  Component Value Date   CHOL 197 12/22/2019   HDL 55 12/22/2019   LDLCALC 118 (H) 12/22/2019   TRIG 125 12/22/2019   CHOLHDL 3.6 12/22/2019   Uncontrolled , not at goal, needs to take lipitor 20 mg    Type 2 diabetes mellitus with other specified complication [facility]) [name] is reminded of the importance of  commitment to daily physical activity for 30 minutes or more, as able and the need to limit carbohydrate intake to 30 to 60 grams per meal to help with blood sugar control.     [name] is reminded of the importance of daily foot exam, annual eye examination, and good blood sugar, blood pressure and cholesterol control.  Diabetic Labs Latest Ref Rng & Units 12/22/2019 10/12/2018 10/29/2016 10/25/2016 07/03/2015  HbA1c <5.7 % of total Hgb 6.6(H) 6.5(H) - 5.8(H) -  Microalbumin mg/dL 6.2 - 1.2 - -  Micro/Creat Ratio <30 mcg/mg creat 37(H) - 6 - -  Chol <200 mg/dL 197 150 - 140 -  HDL > OR = 50 mg/dL 55 54 - 54 -  Calc LDL mg/dL (calc) 118(H) 78 - 70 -  Triglycerides <150 mg/dL 125 93 - 82 -  Creatinine 0.50 - 1.10 mg/dL 1.33(H) 1.44(H) - 1.42(H) 1.51(H)   BP/Weight 12/22/2019 04/08/2019 10/06/2018 09/25/2017 10/29/2016 12/28/2015 7/34/2876  Systolic BP 811 572 620 355 974 163 845  Diastolic BP 364 96 72 88 680 82 56  Wt. (Lbs) 311 304.08 301 296.12 299 293 -  BMI 51.75 50.6 50.09 49.28 49.76 48.76 -   Foot/eye exam completion dates Latest Ref Rng & Units 02/21/2015 11/02/2013  Eye Exam No Retinopathy - -  Foot exam Order - - -  Foot Form Completion - Done Done

## 2020-02-03 ENCOUNTER — Ambulatory Visit (INDEPENDENT_AMBULATORY_CARE_PROVIDER_SITE_OTHER): Payer: 59 | Admitting: Family Medicine

## 2020-02-03 ENCOUNTER — Other Ambulatory Visit: Payer: Self-pay

## 2020-02-03 ENCOUNTER — Encounter: Payer: Self-pay | Admitting: Family Medicine

## 2020-02-03 VITALS — BP 161/100 | HR 108 | Resp 15 | Ht 65.0 in | Wt 302.6 lb

## 2020-02-03 DIAGNOSIS — I10 Essential (primary) hypertension: Secondary | ICD-10-CM | POA: Diagnosis not present

## 2020-02-03 DIAGNOSIS — E1169 Type 2 diabetes mellitus with other specified complication: Secondary | ICD-10-CM

## 2020-02-03 DIAGNOSIS — E785 Hyperlipidemia, unspecified: Secondary | ICD-10-CM | POA: Diagnosis not present

## 2020-02-03 MED ORDER — AMLODIPINE BESYLATE-VALSARTAN 10-320 MG PO TABS: 1.0000 | ORAL_TABLET | Freq: Every day | ORAL | 5 refills | Status: DC

## 2020-02-03 MED ORDER — AMLODIPINE BESYLATE 10 MG PO TABS: 10.0000 mg | ORAL_TABLET | Freq: Every day | ORAL | 4 refills | Status: DC

## 2020-02-03 NOTE — Patient Instructions (Signed)
F/U end February or early March , call if you need me sooner  STOP valsartan 320 mg  Add amlodipine 10 mg one at night  Take all 4 blood pressure tablets every day and he tablet for cholesterol  Non fast chem 7 and EGFr 5 days before next appointment  Keep up great health habits

## 2020-02-05 ENCOUNTER — Encounter: Payer: Self-pay | Admitting: Family Medicine

## 2020-02-05 NOTE — Assessment & Plan Note (Signed)
  Patient re-educated about  the importance of commitment to a  minimum of 150 minutes of exercise per week as able.  The importance of healthy food choices with portion control discussed, as well as eating regularly and within a 12 hour window most days. The need to choose "clean , green" food 50 to 75% of the time is discussed, as well as to make water the primary drink and set a goal of 64 ounces water daily.    Weight /BMI 02/03/2020 12/22/2019 04/08/2019  WEIGHT 302 lb 9.6 oz 311 lb 304 lb 1.3 oz  HEIGHT 5\' 5"  5\' 5"  5\' 5"   BMI 50.36 kg/m2 51.75 kg/m2 50.6 kg/m2

## 2020-02-05 NOTE — Progress Notes (Signed)
Christina Davenport     MRN: [id number]      DOB: [birth year]   HPI Christina Davenport is here for follow up and re-evaluation of chronic medical conditions, medication management and review of any available recent lab and radiology data.  Preventive health is updated, specifically  Cancer screening and Immunization.   Questions or concerns regarding consultations or procedures which the PT has had in the interim are  addressed. The PT denies any adverse reactions to current medications since the last visit.  There are no new concerns.  There are no specific complaints   ROS Denies recent fever or chills. Denies sinus pressure, nasal congestion, ear pain or sore throat. Denies chest congestion, productive cough or wheezing. Denies chest pains, palpitations and leg swelling Denies abdominal pain, nausea, vomiting,diarrhea or constipation.   Denies dysuria, frequency, hesitancy or incontinence. Denies joint pain, swelling and limitation in mobility. Denies headaches, seizures, numbness, or tingling. Denies depression, anxiety or insomnia. Denies skin break down or rash.   PE  BP (!) 161/100   Pulse (!) 108   Resp 15   Ht 5\' 5"  (1.651 m)   Wt (!) 302 lb 9.6 oz (137.3 kg)   SpO2 98%   BMI 50.36 kg/m   Patient alert and oriented and in no cardiopulmonary distress.  HEENT: No facial asymmetry, EOMI,     Neck supple .  Chest: Clear to auscultation bilaterally.  CVS: S1, S2 no murmurs, no S3.Regular rate.  ABD: Soft non tender.   Ext: No edema  MS: Adequate ROM spine, shoulders, hips and knees.  Skin: Intact, no ulcerations or rash noted.  Psych: Good eye contact, normal affect. Memory intact not anxious or depressed appearing.  CNS: CN 2-12 intact, power,  normal throughout.no focal deficits noted.   Assessment & Plan  Malignant hypertension Uncontrolled, medication adjusted , return in 8 weeks DASH diet and commitment to daily physical activity for a minimum of 30  minutes discussed and encouraged, as a part of hypertension management. The importance of attaining a healthy weight is also discussed.  BP/Weight 02/03/2020 12/22/2019 04/08/2019 10/06/2018 09/25/2017 10/29/2016 40/10/8117  Systolic BP 147 829 562 130 865 784 696  Diastolic BP 295 284 96 72 88 108 82  Wt. (Lbs) 302.6 311 304.08 301 296.12 299 293  BMI 50.36 51.75 50.6 50.09 49.28 49.76 48.76       Morbid obesity  Patient re-educated about  the importance of commitment to a  minimum of 150 minutes of exercise per week as able.  The importance of healthy food choices with portion control discussed, as well as eating regularly and within a 12 hour window most days. The need to choose "clean , green" food 50 to 75% of the time is discussed, as well as to make water the primary drink and set a goal of 64 ounces water daily.    Weight /BMI 02/03/2020 12/22/2019 04/08/2019  WEIGHT 302 lb 9.6 oz 311 lb 304 lb 1.3 oz  HEIGHT 5\' 5"  5\' 5"  5\' 5"   BMI 50.36 kg/m2 51.75 kg/m2 50.6 kg/m2      Hyperlipidemia LDL goal <100 Hyperlipidemia:Low fat diet discussed and encouraged.   Lipid Panel  Lab Results  Component Value Date   CHOL 197 12/22/2019   HDL 55 12/22/2019   LDLCALC 118 (H) 12/22/2019   TRIG 125 12/22/2019   CHOLHDL 3.6 12/22/2019  not at goal, needs to reduce fat in diet     Type 2 diabetes mellitus with  other specified complication ([place]) Controlled, no change in medication Christina Davenport is reminded of the importance of commitment to daily physical activity for 30 minutes or more, as able and the need to limit carbohydrate intake to 30 to 60 grams per meal to help with blood sugar control.   Christina Davenport is reminded of the importance of daily foot exam, annual eye examination, and good blood sugar, blood pressure and cholesterol control.  Diabetic Labs Latest Ref Rng & Units 12/22/2019 10/12/2018 10/29/2016 10/25/2016 07/03/2015  HbA1c <5.7 % of total Hgb 6.6(H) 6.5(H) - 5.8(H) -   Microalbumin mg/dL 6.2 - 1.2 - -  Micro/Creat Ratio <30 mcg/mg creat 37(H) - 6 - -  Chol <200 mg/dL 197 150 - 140 -  HDL > OR = 50 mg/dL 55 54 - 54 -  Calc LDL mg/dL (calc) 118(H) 78 - 70 -  Triglycerides <150 mg/dL 125 93 - 82 -  Creatinine 0.50 - 1.10 mg/dL 1.33(H) 1.44(H) - 1.42(H) 1.51(H)   BP/Weight 02/03/2020 12/22/2019 04/08/2019 10/06/2018 09/25/2017 10/29/2016 70/04/4033  Systolic BP 248 185 909 311 216 244 695  Diastolic BP 072 257 96 72 88 108 82  Wt. (Lbs) 302.6 311 304.08 301 296.12 299 293  BMI 50.36 51.75 50.6 50.09 49.28 49.76 48.76   Foot/eye exam completion dates Latest Ref Rng & Units 02/03/2020 02/21/2015  Eye Exam No Retinopathy - -  Foot exam Order - - -  Foot Form Completion - Done Done

## 2020-02-05 NOTE — Assessment & Plan Note (Signed)
Uncontrolled, medication adjusted , return in 8 weeks DASH diet and commitment to daily physical activity for a minimum of 30 minutes discussed and encouraged, as a part of hypertension management. The importance of attaining a healthy weight is also discussed.  BP/Weight 02/03/2020 12/22/2019 04/08/2019 10/06/2018 09/25/2017 10/29/2016 09/29/6941  Systolic BP 700 525 910 289 022 840 698  Diastolic BP 614 830 96 72 88 108 82  Wt. (Lbs) 302.6 311 304.08 301 296.12 299 293  BMI 50.36 51.75 50.6 50.09 49.28 49.76 48.76

## 2020-02-05 NOTE — Assessment & Plan Note (Signed)
Hyperlipidemia:Low fat diet discussed and encouraged.   Lipid Panel  Lab Results  Component Value Date   CHOL 197 12/22/2019   HDL 55 12/22/2019   LDLCALC 118 (H) 12/22/2019   TRIG 125 12/22/2019   CHOLHDL 3.6 12/22/2019  not at goal, needs to reduce fat in diet

## 2020-02-05 NOTE — Assessment & Plan Note (Signed)
Controlled, no change in medication Christina Davenport is reminded of the importance of commitment to daily physical activity for 30 minutes or more, as able and the need to limit carbohydrate intake to 30 to 60 grams per meal to help with blood sugar control.   Christina Davenport is reminded of the importance of daily foot exam, annual eye examination, and good blood sugar, blood pressure and cholesterol control.  Diabetic Labs Latest Ref Rng & Units 12/22/2019 10/12/2018 10/29/2016 10/25/2016 07/03/2015  HbA1c <5.7 % of total Hgb 6.6(H) 6.5(H) - 5.8(H) -  Microalbumin mg/dL 6.2 - 1.2 - -  Micro/Creat Ratio <30 mcg/mg creat 37(H) - 6 - -  Chol <200 mg/dL 197 150 - 140 -  HDL > OR = 50 mg/dL 55 54 - 54 -  Calc LDL mg/dL (calc) 118(H) 78 - 70 -  Triglycerides <150 mg/dL 125 93 - 82 -  Creatinine 0.50 - 1.10 mg/dL 1.33(H) 1.44(H) - 1.42(H) 1.51(H)   BP/Weight 02/03/2020 12/22/2019 04/08/2019 10/06/2018 09/25/2017 10/29/2016 57/09/9782  Systolic BP 784 128 208 138 871 959 747  Diastolic BP 185 501 96 72 88 108 82  Wt. (Lbs) 302.6 311 304.08 301 296.12 299 293  BMI 50.36 51.75 50.6 50.09 49.28 49.76 48.76   Foot/eye exam completion dates Latest Ref Rng & Units 02/03/2020 02/21/2015  Eye Exam No Retinopathy - -  Foot exam Order - - -  Foot Form Completion - Done Done

## 2020-03-01 ENCOUNTER — Ambulatory Visit: Payer: 59 | Admitting: Cardiology

## 2020-04-13 ENCOUNTER — Other Ambulatory Visit: Payer: Self-pay

## 2020-04-13 MED ORDER — TRIAMTERENE-HCTZ 75-50 MG PO TABS
1.0000 | ORAL_TABLET | Freq: Every day | ORAL | 1 refills | Status: DC
Start: 2020-04-13 — End: 2020-08-16

## 2020-04-27 ENCOUNTER — Encounter: Payer: Self-pay | Admitting: Family Medicine

## 2020-04-27 ENCOUNTER — Other Ambulatory Visit: Payer: Self-pay

## 2020-04-27 ENCOUNTER — Telehealth (INDEPENDENT_AMBULATORY_CARE_PROVIDER_SITE_OTHER): Payer: 59 | Admitting: Family Medicine

## 2020-04-27 VITALS — BP 96/59 | Ht 65.0 in | Wt 292.7 lb

## 2020-04-27 DIAGNOSIS — E118 Type 2 diabetes mellitus with unspecified complications: Secondary | ICD-10-CM | POA: Diagnosis not present

## 2020-04-27 DIAGNOSIS — I10 Essential (primary) hypertension: Secondary | ICD-10-CM

## 2020-04-27 DIAGNOSIS — Z01419 Encounter for gynecological examination (general) (routine) without abnormal findings: Secondary | ICD-10-CM

## 2020-04-27 DIAGNOSIS — N183 Chronic kidney disease, stage 3 unspecified: Secondary | ICD-10-CM

## 2020-04-27 DIAGNOSIS — E785 Hyperlipidemia, unspecified: Secondary | ICD-10-CM | POA: Diagnosis not present

## 2020-04-27 DIAGNOSIS — E1169 Type 2 diabetes mellitus with other specified complication: Secondary | ICD-10-CM

## 2020-04-27 DIAGNOSIS — Z1211 Encounter for screening for malignant neoplasm of colon: Secondary | ICD-10-CM

## 2020-04-27 MED ORDER — AMLODIPINE BESYLATE 5 MG PO TABS: ORAL_TABLET | ORAL | 3 refills | Status: DC

## 2020-04-27 NOTE — Patient Instructions (Addendum)
F/U in office with mD in early May, re evaluate weight and blood pressure, call if you need me sooner  STOP clonidine   New lower dose of bedtime amlodipine is 5 mg one at bedtime  Continue triamterene, and amlodipine/ valsartan as before  Please get fasting lipid, cmp and EGR and HBa1C last week in April, for your May visit  CONGRATS on change in eating which has promoted  Weight loss, keep it up!   You are referred to [facility], for a colonoscopy and for diabetic eye exam, very important that you keep all appointments   Calorie Counting for Weight Loss Calories are units of energy. Your body needs a certain number of calories from food to keep going throughout the day. When you eat or drink more calories than your body needs, your body stores the extra calories mostly as fat. When you eat or drink fewer calories than your body needs, your body burns fat to get the energy it needs. Calorie counting means keeping track of how many calories you eat and drink each day. Calorie counting can be helpful if you need to lose weight. If you eat fewer calories than your body needs, you should lose weight. Ask your health care provider what a healthy weight is for you. For calorie counting to work, you will need to eat the right number of calories each day to lose a healthy amount of weight per week. A dietitian can help you figure out how many calories you need in a day and will suggest ways to reach your calorie goal.  A healthy amount of weight to lose each week is usually 1-2 lb (0.5-0.9 kg). This usually means that your daily calorie intake should be reduced by 500-750 calories.  Eating 1,200-1,500 calories a day can help most women lose weight.  Eating 1,500-1,800 calories a day can help most men lose weight. What do I need to know about calorie counting? Work with your health care provider or dietitian to determine how many calories you should get each day. To meet your daily calorie goal, you will  need to:  Find out how many calories are in each food that you would like to eat. Try to do this before you eat.  Decide how much of the food you plan to eat.  Keep a food log. Do this by writing down what you ate and how many calories it had. To successfully lose weight, it is important to balance calorie counting with a healthy lifestyle that includes regular activity. Where do I find calorie information? The number of calories in a food can be found on a Nutrition Facts label. If a food does not have a Nutrition Facts label, try to look up the calories online or ask your dietitian for help. Remember that calories are listed per serving. If you choose to have more than one serving of a food, you will have to multiply the calories per serving by the number of servings you plan to eat. For example, the label on a package of bread might say that a serving size is 1 slice and that there are 90 calories in a serving. If you eat 1 slice, you will have eaten 90 calories. If you eat 2 slices, you will have eaten 180 calories.   How do I keep a food log? After each time that you eat, record the following in your food log as soon as possible:  What you ate. Be sure to include toppings, sauces,  and other extras on the food.  How much you ate. This can be measured in cups, ounces, or number of items.  How many calories were in each food and drink.  The total number of calories in the food you ate. Keep your food log near you, such as in a pocket-sized notebook or on an app or website on your mobile phone. Some programs will calculate calories for you and show you how many calories you have left to meet your daily goal. What are some portion-control tips?  Know how many calories are in a serving. This will help you know how many servings you can have of a certain food.  Use a measuring cup to measure serving sizes. You could also try weighing out portions on a kitchen scale. With time, you will be  able to estimate serving sizes for some foods.  Take time to put servings of different foods on your favorite plates or in your favorite bowls and cups so you know what a serving looks like.  Try not to eat straight from a food's packaging, such as from a bag or box. Eating straight from the package makes it hard to see how much you are eating and can lead to overeating. Put the amount you would like to eat in a cup or on a plate to make sure you are eating the right portion.  Use smaller plates, glasses, and bowls for smaller portions and to prevent overeating.  Try not to multitask. For example, avoid watching TV or using your computer while eating. If it is time to eat, sit down at a table and enjoy your food. This will help you recognize when you are full. It will also help you be more mindful of what and how much you are eating. What are tips for following this plan? Reading food labels  Check the calorie count compared with the serving size. The serving size may be smaller than what you are used to eating.  Check the source of the calories. Try to choose foods that are high in protein, fiber, and vitamins, and low in saturated fat, trans fat, and sodium. Shopping  Read nutrition labels while you shop. This will help you make healthy decisions about which foods to buy.  Pay attention to nutrition labels for low-fat or fat-free foods. These foods sometimes have the same number of calories or more calories than the full-fat versions. They also often have added sugar, starch, or salt to make up for flavor that was removed with the fat.  Make a grocery list of lower-calorie foods and stick to it. Cooking  Try to cook your favorite foods in a healthier way. For example, try baking instead of frying.  Use low-fat dairy products. Meal planning  Use more fruits and vegetables. One-half of your plate should be fruits and vegetables.  Include lean proteins, such as chicken, [place], and  fish. Lifestyle Each week, aim to do one of the following:  150 minutes of moderate exercise, such as walking.  75 minutes of vigorous exercise, such as running. General information  Know how many calories are in the foods you eat most often. This will help you calculate calorie counts faster.  Find a way of tracking calories that works for you. Get creative. Try different apps or programs if writing down calories does not work for you. What foods should I eat?  Eat nutritious foods. It is better to have a nutritious, high-calorie food, such as an avocado,  than a food with few nutrients, such as a bag of potato chips.  Use your calories on foods and drinks that will fill you up and will not leave you hungry soon after eating. ? Examples of foods that fill you up are nuts and nut butters, vegetables, lean proteins, and high-fiber foods such as whole grains. High-fiber foods are foods with more than 5 g of fiber per serving.  Pay attention to calories in drinks. Low-calorie drinks include water and unsweetened drinks. The items listed above may not be a complete list of foods and beverages you can eat. Contact a dietitian for more information.   What foods should I limit? Limit foods or drinks that are not good sources of vitamins, minerals, or protein or that are high in unhealthy fats. These include:  Candy.  Other sweets.  Sodas, specialty coffee drinks, alcohol, and juice. The items listed above may not be a complete list of foods and beverages you should avoid. Contact a dietitian for more information. How do I count calories when eating out?  Pay attention to portions. Often, portions are much larger when eating out. Try these tips to keep portions smaller: ? Consider sharing a meal instead of getting your own. ? If you get your own meal, eat only half of it. Before you start eating, ask for a container and put half of your meal into it. ? When available, consider ordering  smaller portions from the menu instead of full portions.  Pay attention to your food and drink choices. Knowing the way food is cooked and what is included with the meal can help you eat fewer calories. ? If calories are listed on the menu, choose the lower-calorie options. ? Choose dishes that include vegetables, fruits, whole grains, low-fat dairy products, and lean proteins. ? Choose items that are boiled, broiled, grilled, or steamed. Avoid items that are buttered, battered, fried, or served with cream sauce. Items labeled as crispy are usually fried, unless stated otherwise. ? Choose water, low-fat milk, unsweetened iced tea, or other drinks without added sugar. If you want an alcoholic beverage, choose a lower-calorie option, such as a glass of wine or light beer. ? Ask for dressings, sauces, and syrups on the side. These are usually high in calories, so you should limit the amount you eat. ? If you want a salad, choose a garden salad and ask for grilled meats. Avoid extra toppings such as bacon, cheese, or fried items. Ask for the dressing on the side, or ask for olive oil and vinegar or lemon to use as dressing.  Estimate how many servings of a food you are given. Knowing serving sizes will help you be aware of how much food you are eating at restaurants. Where to find more information  Centers for Disease Control and Prevention: http://www.wolf.info/  [place] Department of Agriculture: http://www.wilson-mendoza.org/ Summary  Calorie counting means keeping track of how many calories you eat and drink each day. If you eat fewer calories than your body needs, you should lose weight.  A healthy amount of weight to lose per week is usually 1-2 lb (0.5-0.9 kg). This usually means reducing your daily calorie intake by 500-750 calories.  The number of calories in a food can be found on a Nutrition Facts label. If a food does not have a Nutrition Facts label, try to look up the calories online or ask your dietitian for  help.  Use smaller plates, glasses, and bowls for smaller portions and to  prevent overeating.  Use your calories on foods and drinks that will fill you up and not leave you hungry shortly after a meal. This information is not intended to replace advice given to you by your health care provider. Make sure you discuss any questions you have with your health care provider. Document Revised: 03/25/2019 Document Reviewed: 03/25/2019 Elsevier Patient Education  2021 [employer].

## 2020-05-01 ENCOUNTER — Encounter: Payer: Self-pay | Admitting: Family Medicine

## 2020-05-01 NOTE — Assessment & Plan Note (Signed)
Improved  Patient re-educated about  the importance of commitment to a  minimum of 150 minutes of exercise per week as able.  The importance of healthy food choices with portion control discussed, as well as eating regularly and within a 12 hour window most days. The need to choose "clean , green" food 50 to 75% of the time is discussed, as well as to make water the primary drink and set a goal of 64 ounces water daily.    Weight /BMI 04/27/2020 02/03/2020 12/22/2019  WEIGHT 292 lb 11.2 oz 302 lb 9.6 oz 311 lb  HEIGHT 5\' 5"  5\' 5"  5\' 5"   BMI 48.71 kg/m2 50.36 kg/m2 51.75 kg/m2

## 2020-05-01 NOTE — Progress Notes (Signed)
Virtual Visit via Telephone Note  I connected with Christina Davenport on 05/01/20 at  4:00 PM EST by telephone and verified that I am speaking with the correct person using two identifiers.  Location: Patient: home Provider: office   I discussed the limitations, risks, security and privacy concerns of performing an evaluation and management service by telephone and the availability of in person appointments. I also discussed with the patient that there may be a patient responsible charge related to this service. The patient expressed understanding and agreed to proceed.   History of Present Illness: F/U chronic problems and address any new or current concerns. Review and update medications and allergies. Review recent lab and radiologic data . Update routine health maintainace. C/o low blood pressure and light headedness on current regime, intermittently Review an encourage improved health habits to include nutrition, exercise and  sleep . Has been working on change in diet with excellent weight loss Denies recent fever or chills. Denies sinus pressure, nasal congestion, ear pain or sore throat. Denies chest congestion, productive cough or wheezing. Denies chest pains, palpitations and leg swelling Denies abdominal pain, nausea, vomiting,diarrhea or constipation.   Denies dysuria, frequency, hesitancy or incontinence. Denies joint pain, swelling and limitation in mobility. Denies headaches, seizures, numbness, or tingling. Denies depression, anxiety or insomnia. Denies skin break down or rash.       Observations/Objective: BP (!) 96/59   Ht 5\' 5"  (1.651 m)   Wt 292 lb 11.2 oz (132.8 kg)   BMI 48.71 kg/m  Good communication with no confusion and intact memory. Alert and oriented x 3 No signs of respiratory distress during speech    Assessment and Plan: Malignant hypertension Over corrected per prt report, stop clonidine and reduce dose of amlodipine DASH diet and  commitment to daily physical activity for a minimum of 30 minutes discussed and encouraged, as a part of hypertension management. The importance of attaining a healthy weight is also discussed.  BP/Weight 04/27/2020 02/03/2020 12/22/2019 04/08/2019 10/06/2018 06/28/2990 05/27/6832  Systolic BP 96 196 222 979 892 119 417  Diastolic BP 59 408 144 96 72 88 108  Wt. (Lbs) 292.7 302.6 311 304.08 301 296.12 299  BMI 48.71 50.36 51.75 50.6 50.09 49.28 49.76       Morbid obesity Improved  Patient re-educated about  the importance of commitment to a  minimum of 150 minutes of exercise per week as able.  The importance of healthy food choices with portion control discussed, as well as eating regularly and within a 12 hour window most days. The need to choose "clean , green" food 50 to 75% of the time is discussed, as well as to make water the primary drink and set a goal of 64 ounces water daily.    Weight /BMI 04/27/2020 02/03/2020 12/22/2019  WEIGHT 292 lb 11.2 oz 302 lb 9.6 oz 311 lb  HEIGHT 5\' 5"  5\' 5"  5\' 5"   BMI 48.71 kg/m2 50.36 kg/m2 51.75 kg/m2      Type 2 diabetes mellitus with other specified complication [facility]) Ms. Laidler is reminded of the importance of commitment to daily physical activity for 30 minutes or more, as able and the need to limit carbohydrate intake to 30 to 60 grams per meal to help with blood sugar control.   The need to take medication as prescribed, test blood sugar as directed, and to call between visits if there is a concern that blood sugar is uncontrolled is also discussed.   Christina Davenport  is reminded of the importance of daily foot exam, annual eye examination, and good blood sugar, blood pressure and cholesterol control.  Diabetic Labs Latest Ref Rng & Units 12/22/2019 10/12/2018 10/29/2016 10/25/2016 07/03/2015  HbA1c <5.7 % of total Hgb 6.6(H) 6.5(H) - 5.8(H) -  Microalbumin mg/dL 6.2 - 1.2 - -  Micro/Creat Ratio <30 mcg/mg creat 37(H) - 6 - -  Chol <200 mg/dL 197 150  - 140 -  HDL > OR = 50 mg/dL 55 54 - 54 -  Calc LDL mg/dL (calc) 118(H) 78 - 70 -  Triglycerides <150 mg/dL 125 93 - 82 -  Creatinine 0.50 - 1.10 mg/dL 1.33(H) 1.44(H) - 1.42(H) 1.51(H)   BP/Weight 04/27/2020 02/03/2020 12/22/2019 04/08/2019 10/06/2018 03/03/6158 08/27/7104  Systolic BP 96 269 485 462 703 500 938  Diastolic BP 59 182 993 96 72 88 108  Wt. (Lbs) 292.7 302.6 311 304.08 301 296.12 299  BMI 48.71 50.36 51.75 50.6 50.09 49.28 49.76   Foot/eye exam completion dates Latest Ref Rng & Units 02/03/2020 02/21/2015  Eye Exam No Retinopathy - -  Foot exam Order - - -  Foot Form Completion - Done Done        Hyperlipidemia LDL goal <100 Hyperlipidemia:Low fat diet discussed and encouraged.   Lipid Panel  Lab Results  Component Value Date   CHOL 197 12/22/2019   HDL 55 12/22/2019   LDLCALC 118 (H) 12/22/2019   TRIG 125 12/22/2019   CHOLHDL 3.6 12/22/2019  Updated lab needed at/ before next visit.        Follow Up Instructions:    I discussed the assessment and treatment plan with the patient. The patient was provided an opportunity to ask questions and all were answered. The patient agreed with the plan and demonstrated an understanding of the instructions.   The patient was advised to call back or seek an in-person evaluation if the symptoms worsen or if the condition fails to improve as anticipated.  I provided 18 minutes of non-face-to-face time during this encounter.   Tula Nakayama, MD

## 2020-05-01 NOTE — Assessment & Plan Note (Signed)
Over corrected per prt report, stop clonidine and reduce dose of amlodipine DASH diet and commitment to daily physical activity for a minimum of 30 minutes discussed and encouraged, as a part of hypertension management. The importance of attaining a healthy weight is also discussed.  BP/Weight 04/27/2020 02/03/2020 12/22/2019 04/08/2019 10/06/2018 03/02/6061 0/02/6008  Systolic BP 96 932 355 732 202 542 706  Diastolic BP 59 237 628 96 72 88 108  Wt. (Lbs) 292.7 302.6 311 304.08 301 296.12 299  BMI 48.71 50.36 51.75 50.6 50.09 49.28 49.76

## 2020-05-01 NOTE — Assessment & Plan Note (Signed)
Christina Davenport is reminded of the importance of commitment to daily physical activity for 30 minutes or more, as able and the need to limit carbohydrate intake to 30 to 60 grams per meal to help with blood sugar control.   The need to take medication as prescribed, test blood sugar as directed, and to call between visits if there is a concern that blood sugar is uncontrolled is also discussed.   Christina Davenport is reminded of the importance of daily foot exam, annual eye examination, and good blood sugar, blood pressure and cholesterol control.  Diabetic Labs Latest Ref Rng & Units 12/22/2019 10/12/2018 10/29/2016 10/25/2016 07/03/2015  HbA1c <5.7 % of total Hgb 6.6(H) 6.5(H) - 5.8(H) -  Microalbumin mg/dL 6.2 - 1.2 - -  Micro/Creat Ratio <30 mcg/mg creat 37(H) - 6 - -  Chol <200 mg/dL 197 150 - 140 -  HDL > OR = 50 mg/dL 55 54 - 54 -  Calc LDL mg/dL (calc) 118(H) 78 - 70 -  Triglycerides <150 mg/dL 125 93 - 82 -  Creatinine 0.50 - 1.10 mg/dL 1.33(H) 1.44(H) - 1.42(H) 1.51(H)   BP/Weight 04/27/2020 02/03/2020 12/22/2019 04/08/2019 10/06/2018 0/06/1100 02/25/1733  Systolic BP 96 670 141 030 131 438 887  Diastolic BP 59 579 728 96 72 88 108  Wt. (Lbs) 292.7 302.6 311 304.08 301 296.12 299  BMI 48.71 50.36 51.75 50.6 50.09 49.28 49.76   Foot/eye exam completion dates Latest Ref Rng & Units 02/03/2020 02/21/2015  Eye Exam No Retinopathy - -  Foot exam Order - - -  Foot Form Completion - Done Done

## 2020-05-01 NOTE — Assessment & Plan Note (Signed)
Hyperlipidemia:Low fat diet discussed and encouraged.   Lipid Panel  Lab Results  Component Value Date   CHOL 197 12/22/2019   HDL 55 12/22/2019   LDLCALC 118 (H) 12/22/2019   TRIG 125 12/22/2019   CHOLHDL 3.6 12/22/2019  Updated lab needed at/ before next visit.

## 2020-05-11 ENCOUNTER — Ambulatory Visit: Payer: 59 | Admitting: Family Medicine

## 2020-05-16 ENCOUNTER — Other Ambulatory Visit: Payer: Self-pay | Admitting: Family Medicine

## 2020-06-07 ENCOUNTER — Ambulatory Visit: Payer: 59 | Admitting: Gastroenterology

## 2020-06-17 ENCOUNTER — Encounter: Payer: Self-pay | Admitting: Family Medicine

## 2020-06-19 ENCOUNTER — Telehealth (INDEPENDENT_AMBULATORY_CARE_PROVIDER_SITE_OTHER): Payer: 59 | Admitting: Internal Medicine

## 2020-06-19 ENCOUNTER — Other Ambulatory Visit: Payer: Self-pay

## 2020-06-19 ENCOUNTER — Encounter: Payer: Self-pay | Admitting: Internal Medicine

## 2020-06-19 DIAGNOSIS — Z7189 Other specified counseling: Secondary | ICD-10-CM | POA: Diagnosis not present

## 2020-06-19 DIAGNOSIS — U071 COVID-19: Secondary | ICD-10-CM

## 2020-06-19 MED ORDER — MOLNUPIRAVIR EUA 200MG CAPSULE: 4.0000 | ORAL_CAPSULE | Freq: Two times a day (BID) | ORAL | 0 refills | Status: AC

## 2020-06-19 NOTE — Telephone Encounter (Signed)
Pt made an appt with patel 06-19-20

## 2020-06-19 NOTE — Progress Notes (Signed)
Virtual Visit via Telephone Note   This visit type was conducted due to national recommendations for restrictions regarding the COVID-19 Pandemic (e.g. social distancing) in an effort to limit this patient's exposure and mitigate transmission in our community.  Due to her co-morbid illnesses, this patient is at least at moderate risk for complications without adequate follow up.  This format is felt to be most appropriate for this patient at this time.  The patient did not have access to video technology/had technical difficulties with video requiring transitioning to audio format only (telephone).  All issues noted in this document were discussed and addressed.  No physical exam could be performed with this format.  Evaluation Performed:  Follow-up visit  Date:  06/19/2020   ID:  Christina Davenport, DOB 04/08/1970, MRN [id number]  Patient Location: Home Provider Location: Office/Clinic  Participants: Patient Location of Patient: Home Location of Provider: Telehealth Consent was obtain for visit to be over via telehealth. I verified that I am speaking with the correct person using two identifiers.  PCP:  Christina Helper, MD   Chief Complaint:  COVID positive  History of Present Illness:    Christina Davenport is a 50 y.o. female who has a televisit for c/o chest congestion, cough and low grade fever for last 3 days. She also has loss of taste and smell sensation. She had positive COVID test at home. She has tried Mucinex and Tylenol so far with minimal help. She denies any dyspnea, wheezing or chest pain currently. She is up-to-date with COVID vaccine.  The patient does have symptoms concerning for COVID-19 infection (fever, chills, cough, or new shortness of breath).   Past Medical, Surgical, Social History, Allergies, and Medications have been Reviewed.  Past Medical History:  Diagnosis Date  . Brain tumor (benign) ([place]) 2011   excision followed by radiation  . CHF  (congestive heart failure) ([place])    Phreesia 12/20/2019  . Depression, major, single episode, moderate ([place]) 12/26/2019   PHQ 9 score of 12 in 11/2019  . Diabetes mellitus type 2 in obese ([place]) 12/09/2007   Qualifier: Diagnosis of  By: Moshe Cipro MD, Joycelyn Schmid    . Diabetes mellitus without complication ([place])    Phreesia 12/20/2019  . Diabetes mellitus, type 2 ([facility])    diet controlled  . Endometrial thickening on ultra sound 01/08/2012   Reports had pelvic exam in 2015 will send for record   . 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 Hypertension   . Obesity   . Ovarian cyst 01/08/2012  . PCO (polycystic ovaries)   . S/P right oophorectomy 07/07/2015  . 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 BRAIN SURGERY  11/12  . LAPAROTOMY Bilateral 07/07/2015   Procedure: EXPLORATORY LAPAROTOMY  LEFT OVARIAN CYSTECTOMY, LEFT PARATUBAL CYSTECTOMY;  Surgeon: Servando Salina, MD;  Location: [facility];  Service: Gynecology;  Laterality: Bilateral;  . repair of chiari malformation  05/02  . SALPINGOOPHORECTOMY Right 07/07/2015   Procedure: RIGHT SALPINGO OOPHORECTOMY;  Surgeon: Servando Salina, MD;  Location: [facility];  Service: Gynecology;  Laterality: Right;  . tumor removed  Brain, non cancerous   12/2009     Current Meds  Medication Sig  . acetaminophen (TYLENOL) 500 MG tablet Take 500 mg by mouth every 6 (  six) hours as needed for mild pain.  Marland Kitchen amLODipine (NORVASC) 5 MG tablet Take one tablet by mouth every evening at 9 pm  . amLODipine-valsartan (EXFORGE) 10-320 MG tablet TAKE 1 TABLET BY MOUTH DAILY  . atorvastatin (LIPITOR) 20 MG tablet Take 1 tablet (20 mg total) by mouth daily.  . Cholecalciferol 25 MCG (1000 UT) tablet Take by mouth.  . ferrous gluconate (FERGON) 324 MG tablet Take by mouth.  Gus Height EUA 200 mg CAPS Take 4 capsules (800 mg total) by mouth 2 (two) times daily for 5 days.  . Pediatric Multivitamins-Fl (MULTIVITAMIN/FLUORIDE) 1 MG CHEW Chew by mouth.  . triamterene-hydrochlorothiazide (MAXZIDE) 75-50 MG tablet Take 1 tablet by mouth daily.     Allergies:   Other, Enalapril maleate, and Metformin   ROS:   Please see the history of present illness.     All other systems reviewed and are negative.   Labs/Other Tests and Data Reviewed:    Recent Labs: 12/22/2019: ALT 15; BUN 17; Creat 1.33; Hemoglobin 12.6; Platelets 326; Potassium 3.7; Sodium 137; TSH 1.75   Recent Lipid Panel Lab Results  Component Value Date/Time   CHOL 197 12/22/2019 10:22 AM   TRIG 125 12/22/2019 10:22 AM   HDL 55 12/22/2019 10:22 AM   CHOLHDL 3.6 12/22/2019 10:22 AM   LDLCALC 118 (H) 12/22/2019 10:22 AM    Wt Readings from Last 3 Encounters:  04/27/20 292 lb 11.2 oz (132.8 kg)  02/03/20 (!) 302 lb 9.6 oz (137.3 kg)  12/22/19 (!) 311 lb (141.1 kg)      ASSESSMENT & PLAN:    COVID-19 infection Considering underlying medical conditions, prescribed Molnupiravir Continue symptomatic treatment Advised to get immediate medical attention if dyspnea, wheezing, chest pain or hemoptysis Self-quarantine for at least 7 days from symptom onset or until 24-hour afebrile period   Time:   Today, I have spent 12 minutes reviewing the chart, including problem list, medications, and with the patient with telehealth technology discussing the above problems.   Medication Adjustments/Labs and Tests Ordered: Current medicines are reviewed at length with the patient today.  Concerns regarding medicines are outlined above.   Tests Ordered: No orders of the defined types were placed in this encounter.   Medication Changes: Meds ordered this encounter  Medications  . molnupiravir EUA 200 mg CAPS    Sig: Take 4 capsules (800 mg total) by mouth 2 (two) times daily for 5 days.     Dispense:  40 capsule    Refill:  0     Note: This dictation was prepared with Dragon dictation along with smaller phrase technology. Similar sounding words can be transcribed inadequately or may not be corrected upon review. Any transcriptional errors that result from this process are unintentional.      Disposition:  Follow up  Signed, Lindell Spar, MD  06/19/2020 9:39 AM     [facility]

## 2020-06-19 NOTE — Patient Instructions (Addendum)
Please start taking Molnupiravir as prescribed.  Continue to take Mucinex as needed for cough.  Please use Nasal saline spray for nasal congestion.  Please continue to self-quarantine for at least 7 days from symptom onset or until 24-hour afebrile period.

## 2020-06-29 ENCOUNTER — Ambulatory Visit: Payer: 59 | Admitting: Family Medicine

## 2020-08-08 ENCOUNTER — Telehealth: Payer: Self-pay | Admitting: *Deleted

## 2020-08-08 ENCOUNTER — Encounter: Payer: Self-pay | Admitting: *Deleted

## 2020-08-08 ENCOUNTER — Ambulatory Visit (INDEPENDENT_AMBULATORY_CARE_PROVIDER_SITE_OTHER): Payer: 59 | Admitting: Gastroenterology

## 2020-08-08 ENCOUNTER — Other Ambulatory Visit: Payer: Self-pay

## 2020-08-08 ENCOUNTER — Encounter: Payer: Self-pay | Admitting: Gastroenterology

## 2020-08-08 DIAGNOSIS — Z1211 Encounter for screening for malignant neoplasm of colon: Secondary | ICD-10-CM

## 2020-08-08 MED ORDER — PEG 3350-KCL-NA BICARB-NACL 420 G PO SOLR: ORAL | 0 refills | Status: DC

## 2020-08-08 NOTE — Telephone Encounter (Signed)
PA approved via [facility] for TCS. Auth# [id number], DOS 09/18/2020-12/17/2020

## 2020-08-08 NOTE — Patient Instructions (Signed)
Colonoscopy as scheduled.  Please see separate instructions.

## 2020-08-08 NOTE — Progress Notes (Signed)
Primary Care Physician:  Fayrene Helper, MD  Primary Gastroenterologist:  Elon Alas. Abbey Chatters, DO   Chief Complaint  Patient presents with   Colonoscopy    Consult, never had tcs. No fhcrc. No problems    HPI:  Christina Davenport is a 50 y.o. female here to schedule first-ever colonoscopy. She has h/o diet controlled DM, HTN, h/o left-sided glomus tumor at base of skull. No FH colon cancer. .  BM regular. No melena, brbpr. No abdominal pain. No heartburn. No dysphagia. No n/v. No unintentional weight loss.    Current Outpatient Medications  Medication Sig Dispense Refill   acetaminophen (TYLENOL) 500 MG tablet Take 500 mg by mouth every 6 (six) hours as needed for mild pain.     amLODipine (NORVASC) 5 MG tablet Take one tablet by mouth every evening at 9 pm 30 tablet 3   amLODipine-valsartan (EXFORGE) 10-320 MG tablet TAKE 1 TABLET BY MOUTH DAILY (Patient taking differently: Take 1 tablet by mouth daily. Takes in morning.) 30 tablet 5   atorvastatin (LIPITOR) 20 MG tablet Take 1 tablet (20 mg total) by mouth daily. 90 tablet 3   Cholecalciferol 25 MCG (1000 UT) tablet Take 1,000 Units by mouth daily.     ferrous gluconate (FERGON) 324 MG tablet Take 324 mg by mouth daily.     Pediatric Multivitamins-Fl (MULTIVITAMIN/FLUORIDE) 1 MG CHEW Chew by mouth daily.     triamterene-hydrochlorothiazide (MAXZIDE) 75-50 MG tablet Take 1 tablet by mouth daily. 90 tablet 1   No current facility-administered medications for this visit.    Allergies as of 08/08/2020 - Review Complete 08/08/2020  Allergen Reaction Noted   Other Itching 12/20/2019   Enalapril maleate Hives, Itching, and Swelling 12/09/2007   Metformin Nausea And Vomiting 12/09/2007    Past Medical History:  Diagnosis Date   Brain tumor (benign) ([place]) 2011   excision followed by radiation   CHF (congestive heart failure) ([place])    Phreesia 12/20/2019, patient denies today 08/08/20   Depression, major, single episode, moderate  ([place]) 12/26/2019   PHQ 9 score of 12 in 11/2019   Diabetes mellitus type 2 in obese ([place]) 12/09/2007   Qualifier: Diagnosis of  By: Moshe Cipro MD, Margaret     Diabetes mellitus without complication ([place])    Phreesia 12/20/2019   Diabetes mellitus, type 2 ([place])    diet controlled   Endometrial thickening on ultra sound 01/08/2012   Reports had pelvic exam in 2015 will send for record    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   History of brain tumor 12/28/2015   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       Hypertension    Obesity    Ovarian cyst 01/08/2012   PCO (polycystic ovaries)    S/P right oophorectomy 07/07/2015   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    Past Surgical History:  Procedure Laterality Date   BRAIN SURGERY  11/12   LAPAROTOMY Bilateral 07/07/2015   Procedure: EXPLORATORY LAPAROTOMY  LEFT OVARIAN CYSTECTOMY, LEFT PARATUBAL CYSTECTOMY;  Surgeon: Servando Salina, MD;  Location: [facility];  Service: Gynecology;  Laterality: Bilateral;   repair of chiari malformation  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   SALPINGOOPHORECTOMY Right 07/07/2015   Procedure: RIGHT SALPINGO OOPHORECTOMY;  Surgeon: Servando Salina, MD;  Location: [facility];  Service: Gynecology;  Laterality: Right;   tumor removed  Brain, non cancerous   12/2009    Family History  Problem Relation Age of Onset   Chiari  malformation Mother        requiring surgery    Hypertension Father    Hypertension Sister    Cancer Maternal Grandmother        kidney   Heart disease Maternal Grandmother    Cancer Paternal Grandmother        stomach   Cancer Paternal Grandfather        lung   Colon polyps Neg Hx    Colon cancer Neg Hx     Social History   Socioeconomic History   Marital status: Single    Spouse name: Not on file   Number of children: Not on file   Years of education: Not on file   Highest education level: Not on file  Occupational History   Not on  file  Tobacco Use   Smoking status: Never   Smokeless tobacco: Never  Substance and Sexual Activity   Alcohol use: No   Drug use: No   Sexual activity: Yes    Birth control/protection: None  Other Topics Concern   Not on file  Social History Narrative   Not on file   Social Determinants of Health   Financial Resource Strain: Not on file  Food Insecurity: Not on file  Transportation Needs: Not on file  Physical Activity: Not on file  Stress: Not on file  Social Connections: Not on file  Intimate Partner Violence: Not on file      ROS:  General: Negative for anorexia, weight loss, fever, chills, fatigue, weakness. Eyes: Negative for vision changes.  ENT: Negative for hoarseness, difficulty swallowing , nasal congestion. CV: Negative for chest pain, angina, palpitations, dyspnea on exertion, peripheral edema.  Respiratory: Negative for dyspnea at rest, dyspnea on exertion, cough, sputum, wheezing.  GI: See history of present illness. GU:  Negative for dysuria, hematuria, urinary incontinence, urinary frequency, nocturnal urination.  MS: Negative for joint pain, low back pain.  Derm: Negative for rash or itching.  Neuro: Negative for weakness, abnormal sensation, seizure, frequent headaches, memory loss, confusion. Facial paralysis Psych: Negative for anxiety, depression, suicidal ideation, hallucinations.  Endo: Negative for unusual weight change.  Heme: Negative for bruising or bleeding. Allergy: Negative for rash or hives.    Physical Examination:  BP (!) 202/118   Pulse (!) 102   Temp (!) 97.1 F (36.2 C) (Temporal)   Ht 5\' 5"  (1.651 m)   Wt (!) 306 lb 12.8 oz (139.2 kg)   LMP 06/27/2020 (Approximate)   BMI 51.05 kg/m    General: Well-nourished, well-developed in no acute distress.  Head: Normocephalic, atraumatic.   Eyes: Conjunctiva pink, no icterus. Mouth: masked. Neck: Supple without thyromegaly, masses, or lymphadenopathy.  Lungs: Clear to  auscultation bilaterally.  Heart: Regular rate and rhythm, no murmurs rubs or gallops.  Abdomen: Bowel sounds are normal, nontender, nondistended, no hepatosplenomegaly or masses, no abdominal bruits or    hernia , no rebound or guarding.   Rectal: not performed Extremities: No lower extremity edema. No clubbing or deformities.  Neuro: Alert and oriented x 4 , grossly normal neurologically.  Skin: Warm and dry, no rash or jaundice.   Psych: Alert and cooperative, normal mood and affect.  Labs: Lab Results  Component Value Date   WBC 7.7 12/22/2019   HGB 12.6 12/22/2019   HCT 38.7 12/22/2019   MCV 84.7 12/22/2019   PLT 326 12/22/2019   Lab Results  Component Value Date   CREATININE 1.33 (H) 12/22/2019   BUN 17 12/22/2019  NA 137 12/22/2019   K 3.7 12/22/2019   CL 98 12/22/2019   CO2 29 12/22/2019   Lab Results  Component Value Date   ALT 15 12/22/2019   AST 19 12/22/2019   ALKPHOS 34 10/25/2016   BILITOT 0.5 12/22/2019   Lab Results  Component Value Date   HGBA1C 6.6 (H) 12/22/2019     Imaging Studies: No results found.   Assessment:  50 y/o female with history of diet controlled DM, HTN, morbid obestiy, h/o glomus tumor of base of skull s/p surgery and radiation who presents for first ever screening colonoscopy. Denies any GI symptoms. No FH of colon cancer.   Plan: Colonoscopy in near future with Dr. Abbey Chatters. ASA III due to BMI >40.  I have discussed the risks, alternatives, benefits with regards to but not limited to the risk of reaction to medication, bleeding, infection, perforation and the patient is agreeable to proceed. Written consent to be obtained.

## 2020-08-16 ENCOUNTER — Ambulatory Visit (INDEPENDENT_AMBULATORY_CARE_PROVIDER_SITE_OTHER): Payer: 59 | Admitting: Family Medicine

## 2020-08-16 ENCOUNTER — Other Ambulatory Visit: Payer: Self-pay

## 2020-08-16 ENCOUNTER — Encounter: Payer: Self-pay | Admitting: Family Medicine

## 2020-08-16 VITALS — BP 136/84 | HR 108 | Temp 98.8°F | Resp 20 | Ht 65.0 in | Wt 307.0 lb

## 2020-08-16 DIAGNOSIS — E785 Hyperlipidemia, unspecified: Secondary | ICD-10-CM | POA: Diagnosis not present

## 2020-08-16 DIAGNOSIS — E1169 Type 2 diabetes mellitus with other specified complication: Secondary | ICD-10-CM | POA: Diagnosis not present

## 2020-08-16 DIAGNOSIS — I10 Essential (primary) hypertension: Secondary | ICD-10-CM

## 2020-08-16 MED ORDER — AMLODIPINE BESYLATE 5 MG PO TABS: ORAL_TABLET | ORAL | 2 refills | Status: DC

## 2020-08-16 MED ORDER — ATORVASTATIN CALCIUM 20 MG PO TABS: 20.0000 mg | ORAL_TABLET | Freq: Every day | ORAL | 2 refills | Status: DC

## 2020-08-16 MED ORDER — AMLODIPINE BESYLATE-VALSARTAN 10-320 MG PO TABS: 1.0000 | ORAL_TABLET | Freq: Every day | ORAL | 2 refills | Status: DC

## 2020-08-16 MED ORDER — TRIAMTERENE-HCTZ 75-50 MG PO TABS: 1.0000 | ORAL_TABLET | Freq: Every day | ORAL | 2 refills | Status: DC

## 2020-08-16 NOTE — Patient Instructions (Addendum)
F/U in 4 months, call if you need me sooner  Please get fasting labs already ordered first week in July  You are referred for Bariatric surgery  90 day supplies with 2 refills are being sent for your medication  All the best with colonoscopy You neeed  your diabetic eye exam, we will refer you to " My eye Dr " in [place]  It is important that you exercise regularly at least 30 minutes 5 times a week. If you develop chest pain, have severe difficulty breathing, or feel very tired, stop exercising immediately and seek medical attention  Thanks for choosing [facility], we consider it a privelige to serve you.

## 2020-08-18 ENCOUNTER — Encounter: Payer: Self-pay | Admitting: Family Medicine

## 2020-08-18 NOTE — Assessment & Plan Note (Addendum)
Christina Davenport is reminded of the importance of commitment to daily physical activity for 30 minutes or more, as able and the need to limit carbohydrate intake to 30 to 60 grams per meal to help with blood sugar control.   Christina Davenport is reminded of the importance of daily foot exam, annual eye examination, and good blood sugar, blood pressure and cholesterol control. Updated lab needed    Diabetic Labs Latest Ref Rng & Units 12/22/2019 10/12/2018 10/29/2016 10/25/2016 07/03/2015  HbA1c <5.7 % of total Hgb 6.6(H) 6.5(H) - 5.8(H) -  Microalbumin mg/dL 6.2 - 1.2 - -  Micro/Creat Ratio <30 mcg/mg creat 37(H) - 6 - -  Chol <200 mg/dL 197 150 - 140 -  HDL > OR = 50 mg/dL 55 54 - 54 -  Calc LDL mg/dL (calc) 118(H) 78 - 70 -  Triglycerides <150 mg/dL 125 93 - 82 -  Creatinine 0.50 - 1.10 mg/dL 1.33(H) 1.44(H) - 1.42(H) 1.51(H)   BP/Weight 08/16/2020 08/08/2020 04/27/2020 02/03/2020 12/22/2019 04/08/2019 09/11/5499  Systolic BP 586 825 96 749 355 217 471  Diastolic BP 84 88 59 595 396 96 72  Wt. (Lbs) 307 306.8 292.7 302.6 311 304.08 301  BMI 51.09 51.05 48.71 50.36 51.75 50.6 50.09   Foot/eye exam completion dates Latest Ref Rng & Units 02/03/2020 02/21/2015  Eye Exam No Retinopathy - -  Foot exam Order - - -  Foot Form Completion - Done Done

## 2020-08-18 NOTE — Assessment & Plan Note (Signed)
Hyperlipidemia:Low fat diet discussed and encouraged.   Lipid Panel  Lab Results  Component Value Date   CHOL 197 12/22/2019   HDL 55 12/22/2019   LDLCALC 118 (H) 12/22/2019   TRIG 125 12/22/2019   CHOLHDL 3.6 12/22/2019     Needs to reduce fat in diet Updated lab needed at/ before next visit.

## 2020-08-18 NOTE — Assessment & Plan Note (Signed)
Controlled, no change in medication DASH diet and commitment to daily physical activity for a minimum of 30 minutes discussed and encouraged, as a part of hypertension management. The importance of attaining a healthy weight is also discussed.  BP/Weight 08/16/2020 08/08/2020 04/27/2020 02/03/2020 12/22/2019 04/08/2019 03/29/5425  Systolic BP 062 376 96 283 151 761 607  Diastolic BP 84 88 59 371 062 96 72  Wt. (Lbs) 307 306.8 292.7 302.6 311 304.08 301  BMI 51.09 51.05 48.71 50.36 51.75 50.6 50.09

## 2020-08-18 NOTE — Assessment & Plan Note (Signed)
  Patient re-educated about  the importance of commitment to a  minimum of 150 minutes of exercise per week as able.  The importance of healthy food choices with portion control discussed, as well as eating regularly and within a 12 hour window most days. The need to choose "clean , green" food 50 to 75% of the time is discussed, as well as to make water the primary drink and set a goal of 64 ounces water daily.    Weight /BMI 08/16/2020 08/08/2020 04/27/2020  WEIGHT 307 lb 306 lb 12.8 oz 292 lb 11.2 oz  HEIGHT 5\' 5"  5\' 5"  5\' 5"   BMI 51.09 kg/m2 51.05 kg/m2 48.71 kg/m2

## 2020-08-18 NOTE — Progress Notes (Signed)
THERSIA PETRAGLIA     MRN: [id number]      DOB: 27-Oct-1970   HPI Ms. Abad is here for follow up and re-evaluation of chronic medical conditions, medication management and review of any available recent lab and radiology data.  Preventive health is updated, specifically  Cancer screening and Immunization.   Questions or concerns regarding consultations or procedures which the PT has had in the interim are  addressed. The PT denies any adverse reactions to current medications since the last visit.  There are no new concerns.  There are no specific complaints   ROS Denies recent fever or chills. Denies sinus pressure, nasal congestion, ear pain or sore throat. Denies chest congestion, productive cough or wheezing. Denies chest pains, palpitations and leg swelling Denies abdominal pain, nausea, vomiting,diarrhea or constipation.   Denies dysuria, frequency, hesitancy or incontinence. Denies joint pain, swelling and limitation in mobility. Denies headaches, seizures, numbness, or tingling. Denies depression, anxiety or insomnia. Denies skin break down or rash.   PE  BP 136/84   Pulse (!) 108   Temp 98.8 F (37.1 C)   Resp 20   Ht 5\' 5"  (1.651 m)   Wt (!) 307 lb (139.3 kg)   SpO2 98%   BMI 51.09 kg/m   Patient alert and oriented and in no cardiopulmonary distress.  HEENT: No facial asymmetry, EOMI,     Neck supple .  Chest: Clear to auscultation bilaterally.  CVS: S1, S2 no murmurs, no S3.Regular rate.  ABD: Soft non tender.   Ext: No edema  MS: Adequate ROM spine, shoulders, hips and knees.  Skin: Intact, no ulcerations or rash noted.  Psych: Good eye contact, normal affect. Memory intact not anxious or depressed appearing.  CNS: CN 2-12 intact, power,  normal throughout.no focal deficits noted.   Assessment & Plan Malignant hypertension Controlled, no change in medication DASH diet and commitment to daily physical activity for a minimum of 30 minutes  discussed and encouraged, as a part of hypertension management. The importance of attaining a healthy weight is also discussed.  BP/Weight 08/16/2020 08/08/2020 04/27/2020 02/03/2020 12/22/2019 04/08/2019 3/00/7622  Systolic BP 633 354 96 562 563 893 734  Diastolic BP 84 88 59 287 681 96 72  Wt. (Lbs) 307 306.8 292.7 302.6 311 304.08 301  BMI 51.09 51.05 48.71 50.36 51.75 50.6 50.09       Hyperlipidemia LDL goal <100 Hyperlipidemia:Low fat diet discussed and encouraged.   Lipid Panel  Lab Results  Component Value Date   CHOL 197 12/22/2019   HDL 55 12/22/2019   LDLCALC 118 (H) 12/22/2019   TRIG 125 12/22/2019   CHOLHDL 3.6 12/22/2019     Needs to reduce fat in diet Updated lab needed at/ before next visit.   Morbid obesity  Patient re-educated about  the importance of commitment to a  minimum of 150 minutes of exercise per week as able.  The importance of healthy food choices with portion control discussed, as well as eating regularly and within a 12 hour window most days. The need to choose "clean , green" food 50 to 75% of the time is discussed, as well as to make water the primary drink and set a goal of 64 ounces water daily.    Weight /BMI 08/16/2020 08/08/2020 04/27/2020  WEIGHT 307 lb 306 lb 12.8 oz 292 lb 11.2 oz  HEIGHT 5\' 5"  5\' 5"  5\' 5"   BMI 51.09 kg/m2 51.05 kg/m2 48.71 kg/m2  Type 2 diabetes mellitus with other specified complication [facility]) Ms. Brickey is reminded of the importance of commitment to daily physical activity for 30 minutes or more, as able and the need to limit carbohydrate intake to 30 to 60 grams per meal to help with blood sugar control.   Ms. Liska is reminded of the importance of daily foot exam, annual eye examination, and good blood sugar, blood pressure and cholesterol control. Updated lab needed    Diabetic Labs Latest Ref Rng & Units 12/22/2019 10/12/2018 10/29/2016 10/25/2016 07/03/2015  HbA1c <5.7 % of total Hgb 6.6(H) 6.5(H) - 5.8(H)  -  Microalbumin mg/dL 6.2 - 1.2 - -  Micro/Creat Ratio <30 mcg/mg creat 37(H) - 6 - -  Chol <200 mg/dL 197 150 - 140 -  HDL > OR = 50 mg/dL 55 54 - 54 -  Calc LDL mg/dL (calc) 118(H) 78 - 70 -  Triglycerides <150 mg/dL 125 93 - 82 -  Creatinine 0.50 - 1.10 mg/dL 1.33(H) 1.44(H) - 1.42(H) 1.51(H)   BP/Weight 08/16/2020 08/08/2020 04/27/2020 02/03/2020 12/22/2019 04/08/2019 0/31/5945  Systolic BP 859 292 96 446 286 381 771  Diastolic BP 84 88 59 165 790 96 72  Wt. (Lbs) 307 306.8 292.7 302.6 311 304.08 301  BMI 51.09 51.05 48.71 50.36 51.75 50.6 50.09   Foot/eye exam completion dates Latest Ref Rng & Units 02/03/2020 02/21/2015  Eye Exam No Retinopathy - -  Foot exam Order - - -  Foot Form Completion - Done Done

## 2020-09-08 NOTE — Patient Instructions (Addendum)
Christina Davenport  09/08/2020     @PREFPERIOPPHARMACY @   Your procedure is scheduled on   09/18/2020.   Report to [facility] at  0700  A.M.   Call this number if you have problems the morning of surgery:  [phone]   Remember:  Follow the diet and prep instructions given to you by the office.    Take these medicines the morning of surgery with A SIP OF WATER           amlodipine, prozac.     Do not wear jewelry, make-up or nail polish.  Do not wear lotions, powders, or perfumes, or deodorant.  Do not shave 48 hours prior to surgery.  Men may shave face and neck.  Do not bring valuables to the hospital.  [facility] is not responsible for any belongings or valuables.  Contacts, dentures or bridgework may not be worn into surgery.  Leave your suitcase in the car.  After surgery it may be brought to your room.  For patients admitted to the hospital, discharge time will be determined by your treatment team.  Patients discharged the day of surgery will not be allowed to drive home and must have someone with them for 24 hours.    Special instructions:      DO NOT smoke tobacco or vapd for 24 hours before your procedure.  Please read over the following fact sheets that you were given. Anesthesia Post-op Instructions and Care and Recovery After Surgery      Colonoscopy, Adult, Care After This sheet gives you information about how to care for yourself after your procedure. Your health care provider may also give you more specific instructions. If you have problems or questions, contact your health careprovider. What can I expect after the procedure? After the procedure, it is common to have: A small amount of blood in your stool for 24 hours after the procedure. Some gas. Mild cramping or bloating of your abdomen. Follow these instructions at home: Eating and drinking  Drink enough fluid to keep your urine pale yellow. Follow instructions from your health care  provider about eating or drinking restrictions. Resume your normal diet as instructed by your health care provider. Avoid heavy or fried foods that are hard to digest.  Activity Rest as told by your health care provider. Avoid sitting for a long time without moving. Get up to take short walks every 1-2 hours. This is important to improve blood flow and breathing. Ask for help if you feel weak or unsteady. Return to your normal activities as told by your health care provider. Ask your health care provider what activities are safe for you. Managing cramping and bloating  Try walking around when you have cramps or feel bloated. Apply heat to your abdomen as told by your health care provider. Use the heat source that your health care provider recommends, such as a moist heat pack or a heating pad. Place a towel between your skin and the heat source. Leave the heat on for 20-30 minutes. Remove the heat if your skin turns bright red. This is especially important if you are unable to feel pain, heat, or cold. You may have a greater risk of getting burned.  General instructions If you were given a sedative during the procedure, it can affect you for several hours. Do not drive or operate machinery until your health care provider says that it is safe. For the first 24 hours  after the procedure: Do not sign important documents. Do not drink alcohol. Do your regular daily activities at a slower pace than normal. Eat soft foods that are easy to digest. Take over-the-counter and prescription medicines only as told by your health care provider. Keep all follow-up visits as told by your health care provider. This is important. Contact a health care provider if: You have blood in your stool 2-3 days after the procedure. Get help right away if you have: More than a small spotting of blood in your stool. Large blood clots in your stool. Swelling of your abdomen. Nausea or vomiting. A fever. Increasing  pain in your abdomen that is not relieved with medicine. Summary After the procedure, it is common to have a small amount of blood in your stool. You may also have mild cramping and bloating of your abdomen. If you were given a sedative during the procedure, it can affect you for several hours. Do not drive or operate machinery until your health care provider says that it is safe. Get help right away if you have a lot of blood in your stool, nausea or vomiting, a fever, or increased pain in your abdomen. This information is not intended to replace advice given to you by your health care provider. Make sure you discuss any questions you have with your healthcare provider. Document Revised: 02/05/2019 Document Reviewed: 09/07/2018 Elsevier Patient Education  [place] After This sheet gives you information about how to care for yourself after your procedure. Your health care provider may also give you more specific instructions. If you have problems or questions, contact your health careprovider. What can I expect after the procedure? After the procedure, it is common to have: Tiredness. Forgetfulness about what happened after the procedure. Impaired judgment for important decisions. Nausea or vomiting. Some difficulty with balance. Follow these instructions at home: For the time period you were told by your health care provider:     Rest as needed. Do not participate in activities where you could fall or become injured. Do not drive or use machinery. Do not drink alcohol. Do not take sleeping pills or medicines that cause drowsiness. Do not make important decisions or sign legal documents. Do not take care of children on your own. Eating and drinking Follow the diet that is recommended by your health care provider. Drink enough fluid to keep your urine pale yellow. If you vomit: Drink water, juice, or soup when you can drink without  vomiting. Make sure you have little or no nausea before eating solid foods. General instructions Have a responsible adult stay with you for the time you are told. It is important to have someone help care for you until you are awake and alert. Take over-the-counter and prescription medicines only as told by your health care provider. If you have sleep apnea, surgery and certain medicines can increase your risk for breathing problems. Follow instructions from your health care provider about wearing your sleep device: Anytime you are sleeping, including during daytime naps. While taking prescription pain medicines, sleeping medicines, or medicines that make you drowsy. Avoid smoking. Keep all follow-up visits as told by your health care provider. This is important. Contact a health care provider if: You keep feeling nauseous or you keep vomiting. You feel light-headed. You are still sleepy or having trouble with balance after 24 hours. You develop a rash. You have a fever. You have redness or swelling around the IV site. Get  help right away if: You have trouble breathing. You have new-onset confusion at home. Summary For several hours after your procedure, you may feel tired. You may also be forgetful and have poor judgment. Have a responsible adult stay with you for the time you are told. It is important to have someone help care for you until you are awake and alert. Rest as told. Do not drive or operate machinery. Do not drink alcohol or take sleeping pills. Get help right away if you have trouble breathing, or if you suddenly become confused. This information is not intended to replace advice given to you by your health care provider. Make sure you discuss any questions you have with your healthcare provider. Document Revised: 10/28/2019 Document Reviewed: 01/14/2019 Elsevier Patient Education  2022 [employer].

## 2020-09-12 ENCOUNTER — Encounter (HOSPITAL_COMMUNITY): Payer: Self-pay

## 2020-09-12 ENCOUNTER — Encounter (HOSPITAL_COMMUNITY)
Admission: RE | Admit: 2020-09-12 | Discharge: 2020-09-12 | Disposition: A | Discharge status: Home / Self Care | Payer: 59 | Source: Ambulatory Visit | Attending: Internal Medicine | Admitting: Internal Medicine

## 2020-09-12 ENCOUNTER — Other Ambulatory Visit: Payer: Self-pay

## 2020-09-12 DIAGNOSIS — Z01818 Encounter for other preprocedural examination: Secondary | ICD-10-CM | POA: Insufficient documentation

## 2020-09-12 LAB — CBC WITH DIFFERENTIAL/PLATELET
Abs Immature Granulocytes: 0.01 10*3/uL (ref 0.00–0.07)
Basophils Absolute: 0 10*3/uL (ref 0.0–0.1)
Basophils Relative: 1 %
Eosinophils Absolute: 0.2 10*3/uL (ref 0.0–0.5)
Eosinophils Relative: 3 %
HCT: 43 % (ref 36.0–46.0)
Hemoglobin: 14.5 g/dL (ref 12.0–15.0)
Immature Granulocytes: 0 %
Lymphocytes Relative: 21 %
Lymphs Abs: 1.4 10*3/uL (ref 0.7–4.0)
MCH: 30.1 pg (ref 26.0–34.0)
MCHC: 33.7 g/dL (ref 30.0–36.0)
MCV: 89.2 fL (ref 80.0–100.0)
Monocytes Absolute: 0.5 10*3/uL (ref 0.1–1.0)
Monocytes Relative: 7 %
Neutro Abs: 4.7 10*3/uL (ref 1.7–7.7)
Neutrophils Relative %: 68 %
Platelets: 359 10*3/uL (ref 150–400)
RBC: 4.82 MIL/uL (ref 3.87–5.11)
RDW: 14.2 % (ref 11.5–15.5)
WBC: 6.9 10*3/uL (ref 4.0–10.5)
nRBC: 0 % (ref 0.0–0.2)

## 2020-09-12 LAB — BASIC METABOLIC PANEL
Anion gap: 12 (ref 5–15)
BUN: 29 mg/dL — ABNORMAL HIGH (ref 6–20)
CO2: 27 mmol/L (ref 22–32)
Calcium: 9.5 mg/dL (ref 8.9–10.3)
Chloride: 97 mmol/L — ABNORMAL LOW (ref 98–111)
Creatinine, Ser: 1.49 mg/dL — ABNORMAL HIGH (ref 0.44–1.00)
GFR, Estimated: 43 mL/min — ABNORMAL LOW (ref >60)
Glucose, Bld: 93 mg/dL (ref 70–99)
Potassium: 4.1 mmol/L (ref 3.5–5.1)
Sodium: 136 mmol/L (ref 135–145)

## 2020-09-12 LAB — HCG, SERUM, QUALITATIVE: Preg, Serum: NEGATIVE

## 2020-09-18 ENCOUNTER — Encounter (HOSPITAL_COMMUNITY): Payer: Self-pay

## 2020-09-18 ENCOUNTER — Ambulatory Visit (HOSPITAL_COMMUNITY): Payer: 59 | Admitting: Anesthesiology

## 2020-09-18 ENCOUNTER — Encounter (HOSPITAL_COMMUNITY)
Admission: RE | Disposition: A | Discharge status: Home / Self Care | Payer: Self-pay | Source: Home / Self Care | Attending: Internal Medicine

## 2020-09-18 ENCOUNTER — Other Ambulatory Visit: Payer: Self-pay

## 2020-09-18 ENCOUNTER — Ambulatory Visit (HOSPITAL_COMMUNITY)
Admission: RE | Admit: 2020-09-18 | Discharge: 2020-09-18 | Disposition: A | Discharge status: Home / Self Care | Payer: 59 | Attending: Internal Medicine | Admitting: Internal Medicine

## 2020-09-18 DIAGNOSIS — Z1211 Encounter for screening for malignant neoplasm of colon: Secondary | ICD-10-CM | POA: Diagnosis not present

## 2020-09-18 DIAGNOSIS — K648 Other hemorrhoids: Secondary | ICD-10-CM | POA: Insufficient documentation

## 2020-09-18 DIAGNOSIS — E119 Type 2 diabetes mellitus without complications: Secondary | ICD-10-CM | POA: Diagnosis not present

## 2020-09-18 DIAGNOSIS — E669 Obesity, unspecified: Secondary | ICD-10-CM | POA: Diagnosis not present

## 2020-09-18 DIAGNOSIS — I509 Heart failure, unspecified: Secondary | ICD-10-CM | POA: Insufficient documentation

## 2020-09-18 DIAGNOSIS — Q438 Other specified congenital malformations of intestine: Secondary | ICD-10-CM | POA: Insufficient documentation

## 2020-09-18 DIAGNOSIS — I110 Hypertensive heart disease with heart failure: Secondary | ICD-10-CM | POA: Diagnosis not present

## 2020-09-18 DIAGNOSIS — Z79899 Other long term (current) drug therapy: Secondary | ICD-10-CM | POA: Diagnosis not present

## 2020-09-18 HISTORY — PX: COLONOSCOPY WITH PROPOFOL: SHX5780

## 2020-09-18 LAB — GLUCOSE, CAPILLARY: Glucose-Capillary: 124 mg/dL — ABNORMAL HIGH (ref 70–99)

## 2020-09-18 SURGERY — COLONOSCOPY WITH PROPOFOL
Anesthesia: General

## 2020-09-18 MED ORDER — LIDOCAINE HCL (CARDIAC) PF 100 MG/5ML IV SOSY
PREFILLED_SYRINGE | INTRAVENOUS | Status: DC | PRN
  Administered 2020-09-18: 50 mg via INTRAVENOUS

## 2020-09-18 MED ORDER — PROPOFOL 10 MG/ML IV BOLUS
INTRAVENOUS | Status: DC | PRN
  Administered 2020-09-18: 40 mg via INTRAVENOUS
  Administered 2020-09-18: 150 mg via INTRAVENOUS
  Administered 2020-09-18 (×2): 50 mg via INTRAVENOUS

## 2020-09-18 MED ORDER — STERILE WATER FOR IRRIGATION IR SOLN
Status: DC | PRN
  Administered 2020-09-18: 200 mL

## 2020-09-18 MED ORDER — PROPOFOL 500 MG/50ML IV EMUL
INTRAVENOUS | Status: DC | PRN
  Administered 2020-09-18: 125 ug/kg/min via INTRAVENOUS

## 2020-09-18 MED ORDER — LACTATED RINGERS IV SOLN: INTRAVENOUS | Status: DC

## 2020-09-18 NOTE — Anesthesia Preprocedure Evaluation (Addendum)
Anesthesia Evaluation  Patient identified by MRN, date of birth, ID band Patient awake    Reviewed: Allergy & Precautions, NPO status , Patient's Chart, lab work & pertinent test results  History of Anesthesia Complications Negative for: history of anesthetic complications  Airway Mallampati: II  TM Distance: >3 FB Neck ROM: Full    Dental  (+) Dental Advisory Given, Teeth Intact   Pulmonary neg pulmonary ROS,    Pulmonary exam normal breath sounds clear to auscultation       Cardiovascular Exercise Tolerance: Good hypertension, Pt. on medications +CHF   Rhythm:Regular Rate:Tachycardia  12-Sep-2020 08:19:10 [facility] ROUTINE RECORD 06-15-1970 (73 yr) Female [place]: Loc:[id number] Technician: tbs Test ind: Vent. rate 97 BPM PR interval 128 ms QRS duration 90 ms QT/QTcB 320/406 ms P-R-T axes 65 -66 39 Normal sinus rhythm Left axis deviation Possible Anterolateral infarct , age undetermined Abnormal ECG Axis shift no Q;s in 3,F since 2017 Confirmed by Jenkins Rouge [phone]) on 09/12/2020 9:11:26 AM   Neuro/Psych PSYCHIATRIC DISORDERS Depression  Neuromuscular disease (brain tumor ressection, and radiation in 2021, left sided weakness)    GI/Hepatic negative GI ROS, Neg liver ROS,   Endo/Other  diabetes (diet controlled), Well Controlled, Type 2Morbid obesity  Renal/GU Renal InsufficiencyRenal disease     Musculoskeletal negative musculoskeletal ROS (+)   Abdominal   Peds  Hematology  (+) anemia ,   Anesthesia Other Findings   Reproductive/Obstetrics negative OB ROS                          Anesthesia Physical Anesthesia Plan  ASA: 3  Anesthesia Plan: General   Post-op Pain Management:    Induction: Intravenous  PONV Risk Score and Plan:   Airway Management Planned: Nasal Cannula and Natural Airway  Additional Equipment:   Intra-op Plan:    Post-operative Plan:   Informed Consent: I have reviewed the patients History and Physical, chart, labs and discussed the procedure including the risks, benefits and alternatives for the proposed anesthesia with the patient or authorized representative who has indicated his/her understanding and acceptance.       Plan Discussed with: CRNA and Surgeon  Anesthesia Plan Comments:        Anesthesia Quick Evaluation

## 2020-09-18 NOTE — Discharge Instructions (Addendum)
  Colonoscopy Discharge Instructions  Read the instructions outlined below and refer to this sheet in the next few weeks. These discharge instructions provide you with general information on caring for yourself after you leave the hospital. Your doctor may also give you specific instructions. While your treatment has been planned according to the most current medical practices available, unavoidable complications occasionally occur.   ACTIVITY You may resume your regular activity, but move at a slower pace for the next 24 hours.  Take frequent rest periods for the next 24 hours.  Walking will help get rid of the air and reduce the bloated feeling in your belly (abdomen).  No driving for 24 hours (because of the medicine (anesthesia) used during the test).   Do not sign any important legal documents or operate any machinery for 24 hours (because of the anesthesia used during the test).  NUTRITION Drink plenty of fluids.  You may resume your normal diet as instructed by your doctor.  Begin with a light meal and progress to your normal diet. Heavy or fried foods are harder to digest and may make you feel sick to your stomach (nauseated).  Avoid alcoholic beverages for 24 hours or as instructed.  MEDICATIONS You may resume your normal medications unless your doctor tells you otherwise.  WHAT YOU CAN EXPECT TODAY Some feelings of bloating in the abdomen.  Passage of more gas than usual.  Spotting of blood in your stool or on the toilet paper.  IF YOU HAD POLYPS REMOVED DURING THE COLONOSCOPY: No aspirin products for 7 days or as instructed.  No alcohol for 7 days or as instructed.  Eat a soft diet for the next 24 hours.  FINDING OUT THE RESULTS OF YOUR TEST Not all test results are available during your visit. If your test results are not back during the visit, make an appointment with your caregiver to find out the results. Do not assume everything is normal if you have not heard from your  caregiver or the medical facility. It is important for you to follow up on all of your test results.  SEEK IMMEDIATE MEDICAL ATTENTION IF: You have more than a spotting of blood in your stool.  Your belly is swollen (abdominal distention).  You are nauseated or vomiting.  You have a temperature over 101.  You have abdominal pain or discomfort that is severe or gets worse throughout the day.   Your colonoscopy was relatively unremarkable.  I did not find any polyps or evidence of colon cancer.  I recommend repeating colonoscopy in 10 years for colon cancer screening purposes.  Otherwise, Follow-up with GI as needed.   I hope you have a great rest of your week!  Elon Alas. Abbey Chatters, D.O. Gastroenterology and Hepatology [facility]

## 2020-09-18 NOTE — Transfer of Care (Signed)
Immediate Anesthesia Transfer of Care Note  Patient: Christina Davenport  Procedure(s) Performed: COLONOSCOPY WITH PROPOFOL  Patient Location: Short Stay  Anesthesia Type:General  Level of Consciousness: awake, alert  and patient cooperative  Airway & Oxygen Therapy: Patient Spontanous Breathing  Post-op Assessment: Report given to RN and Post -op Vital signs reviewed and stable  Post vital signs: Reviewed and stable  Last Vitals:  Vitals Value Taken Time  BP    Temp 37 C 09/18/20 0834  Pulse    Resp    SpO2      Last Pain:  Vitals:   09/18/20 0834  TempSrc: Oral  PainSc: 0-No pain      Patients Stated Pain Goal: 3 ([id number])  Complications: No notable events documented.

## 2020-09-18 NOTE — Op Note (Signed)
[facility] Patient Name: Christina Davenport Procedure Date: 09/18/2020 7:53 AM MRN: [id number] Date of Birth: 18-Aug-1970 Attending MD: Elon Alas. Abbey Chatters DO CSN: [id number] Age: 50 Admit Type: Outpatient Procedure:                Colonoscopy Indications:              Screening for colorectal malignant neoplasm Providers:                Elon Alas. Abbey Chatters, DO, Lambert Mody, Aram Candela Referring MD:              Medicines:                See the Anesthesia note for documentation of the                            administered medications Complications:            No immediate complications. Estimated Blood Loss:     Estimated blood loss: none. Procedure:                Pre-Anesthesia Assessment:                           - The anesthesia plan was to use monitored                            anesthesia care (MAC).                           After obtaining informed consent, the colonoscope                            was passed under direct vision. Throughout the                            procedure, the patient's blood pressure, pulse, and                            oxygen saturations were monitored continuously. The                            [id number]-HQ190L ([id number]) scope was introduced through                            the anus and advanced to the the cecum, identified                            by appendiceal orifice and ileocecal valve. The                            colonoscopy was technically difficult and complex                            due to a redundant colon and significant looping.  Successful completion of the procedure was aided by                            applying abdominal pressure. The patient tolerated                            the procedure well. The quality of the bowel                            preparation was evaluated using the BBPS [facility]                            Bowel Preparation Scale) with  scores of: Right                            Colon = 3, Transverse Colon = 3 and Left Colon = 3                            (entire mucosa seen well with no residual staining,                            small fragments of stool or opaque liquid). The                            total BBPS score equals 9. Scope In: 8:11:32 AM Scope Out: 8:28:51 AM Scope Withdrawal Time: 0 hours 6 minutes 39 seconds  Total Procedure Duration: 0 hours 17 minutes 19 seconds  Findings:      The perianal and digital rectal examinations were normal.      Non-bleeding internal hemorrhoids were found during endoscopy.      The colon (entire examined portion) revealed moderately excessive       looping. Advancing the scope required applying abdominal pressure.      The colon (entire examined portion) was moderately redundant. Impression:               - Non-bleeding internal hemorrhoids.                           - There was significant looping of the colon.                           - Redundant colon.                           - No specimens collected. Moderate Sedation:      Per Anesthesia Care Recommendation:           - Patient has a contact number available for                            emergencies. The signs and symptoms of potential                            delayed complications were discussed with the  patient. Return to normal activities tomorrow.                            Written discharge instructions were provided to the                            patient.                           - Resume previous diet.                           - Continue present medications.                           - Repeat colonoscopy in 10 years for screening                            purposes.                           - Return to GI clinic PRN. Procedure Code(s):        --- Professional ---                           K2706, Colorectal cancer screening; colonoscopy on                             individual not meeting criteria for high risk Diagnosis Code(s):        --- Professional ---                           Z12.11, Encounter for screening for malignant                            neoplasm of colon                           K64.8, Other hemorrhoids                           Q43.8, Other specified congenital malformations of                            intestine CPT copyright 2019 American Medical Association. All rights reserved. The codes documented in this report are preliminary and upon coder review may  be revised to meet current compliance requirements. Elon Alas. Abbey Chatters, DO [place] Abbey Chatters, DO 09/18/2020 8:32:04 AM This report has been signed electronically. Number of Addenda: 0

## 2020-09-18 NOTE — H&P (Signed)
Primary Care Physician:  Fayrene Helper, MD Primary Gastroenterologist:  Dr. Abbey Chatters  Pre-Procedure History & Physical: HPI:  Christina Davenport is a 50 y.o. female is here for a colonoscopy for colon cancer screening purposes.  Patient denies any family history of colorectal cancer.  No melena or hematochezia.  No abdominal pain or unintentional weight loss.  No change in bowel habits.  Overall feels well from a GI standpoint.  Past Medical History:  Diagnosis Date   Brain tumor (benign) ([place]) 2011   excision followed by radiation   CHF (congestive heart failure) ([place])    Phreesia 12/20/2019, patient denies today 08/08/20   Depression, major, single episode, moderate ([place]) 12/26/2019   PHQ 9 score of 12 in 11/2019   Diabetes mellitus type 2 in obese ([place]) 12/09/2007   Qualifier: Diagnosis of  By: Moshe Cipro MD, Margaret     Diabetes mellitus without complication ([place])    Phreesia 12/20/2019   Diabetes mellitus, type 2 ([place])    diet controlled   Endometrial thickening on ultra sound 01/08/2012   Reports had pelvic exam in 2015 will send for record    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   History of brain tumor 12/28/2015   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       Hypertension    Obesity    Ovarian cyst 01/08/2012   PCO (polycystic ovaries)    S/P right oophorectomy 07/07/2015   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    Past Surgical History:  Procedure Laterality Date   BRAIN SURGERY  11/12   LAPAROTOMY Bilateral 07/07/2015   Procedure: EXPLORATORY LAPAROTOMY  LEFT OVARIAN CYSTECTOMY, LEFT PARATUBAL CYSTECTOMY;  Surgeon: Servando Salina, MD;  Location: [facility];  Service: Gynecology;  Laterality: Bilateral;   repair of chiari malformation  05/02   SALPINGOOPHORECTOMY Right 07/07/2015   Procedure: RIGHT SALPINGO OOPHORECTOMY;  Surgeon: Servando Salina, MD;  Location: [facility];  Service: Gynecology;   Laterality: Right;   tumor removed  Brain, non cancerous   12/2009    Prior to Admission medications   Medication Sig Start Date End Date Taking? Authorizing Provider  acetaminophen (TYLENOL) 500 MG tablet Take 500 mg by mouth every 6 (six) hours as needed for mild pain.   Yes [provider]  amLODipine (NORVASC) 5 MG tablet Take one tablet by mouth every evening at 9 pm Patient taking differently: Take 5 mg by mouth every evening. 08/16/20  Yes Fayrene Helper, MD  amLODipine-valsartan (EXFORGE) 10-320 MG tablet Take 1 tablet by mouth daily. Patient taking differently: Take 1 tablet by mouth in the morning. 08/16/20  Yes Fayrene Helper, MD  atorvastatin (LIPITOR) 20 MG tablet Take 1 tablet (20 mg total) by mouth daily. Patient taking differently: Take 20 mg by mouth every evening. 08/16/20  Yes Fayrene Helper, MD  Cholecalciferol 25 MCG (1000 UT) tablet Take 1,000 Units by mouth daily.   Yes [provider]  ferrous gluconate (FERGON) 324 MG tablet Take 324 mg by mouth daily.   Yes [provider]  hydroxypropyl methylcellulose / hypromellose (ISOPTO TEARS / GONIOVISC) 2.5 % ophthalmic solution Place 1 drop into both eyes 4 (four) times daily as needed for dry eyes.   Yes [provider]  Multiple Vitamins-Iron (MULTIVITAMINS WITH IRON) TABS tablet Take 1 tablet by mouth daily.   Yes [provider]  triamterene-hydrochlorothiazide (MAXZIDE) 75-50 MG tablet Take 1 tablet by mouth daily. Patient taking  differently: Take 1 tablet by mouth in the morning. 08/16/20  Yes Fayrene Helper, MD  White Petrolatum-Mineral Oil (EYE LUBRICANT) OINT Place 1 tablet into both eyes in the morning, at noon, and at bedtime.   Yes [provider]  polyethylene glycol-electrolytes (NULYTELY) 420 g solution As directed 08/08/20   Eloise Harman, DO    Allergies as of 08/08/2020 - Review Complete 08/08/2020  Allergen Reaction Noted   Other  Itching 12/20/2019   Enalapril maleate Hives, Itching, and Swelling 12/09/2007   Metformin Nausea And Vomiting 12/09/2007    Family History  Problem Relation Age of Onset   Chiari malformation Mother        requiring surgery    Hypertension Father    Hypertension Sister    Cancer Maternal Grandmother        kidney   Heart disease Maternal Grandmother    Cancer Paternal Grandmother        stomach   Cancer Paternal Grandfather        lung   Colon polyps Neg Hx    Colon cancer Neg Hx     Social History   Socioeconomic History   Marital status: Single    Spouse name: Not on file   Number of children: Not on file   Years of education: Not on file   Highest education level: Not on file  Occupational History   Not on file  Tobacco Use   Smoking status: Never   Smokeless tobacco: Never  Vaping Use   Vaping Use: Never used  Substance and Sexual Activity   Alcohol use: No   Drug use: No   Sexual activity: Yes    Birth control/protection: None  Other Topics Concern   Not on file  Social History Narrative   Not on file   Social Determinants of Health   Financial Resource Strain: Not on file  Food Insecurity: Not on file  Transportation Needs: Not on file  Physical Activity: Not on file  Stress: Not on file  Social Connections: Not on file  Intimate Partner Violence: Not on file    Review of Systems: See HPI, otherwise negative ROS  Physical Exam: Vital signs in last 24 hours: Temp:  [98.6 F (37 C)] 98.6 F (37 C) (07/25 0727) Pulse Rate:  [99] 99 (07/25 0727) Resp:  [11] 11 (07/25 0727) BP: (158)/(90) 158/90 (07/25 0727) SpO2:  [98 %] 98 % (07/25 0727)   General:   Alert,  Well-developed, well-nourished, pleasant and cooperative in NAD Head:  Normocephalic and atraumatic. Eyes:  Sclera clear, no icterus.   Conjunctiva pink. Ears:  Normal auditory acuity. Nose:  No deformity, discharge,  or lesions. Mouth:  No deformity or lesions, dentition  normal. Neck:  Supple; no masses or thyromegaly. Lungs:  Clear throughout to auscultation.   No wheezes, crackles, or rhonchi. No acute distress. Heart:  Regular rate and rhythm; no murmurs, clicks, rubs,  or gallops. Abdomen:  Soft, nontender and nondistended. No masses, hepatosplenomegaly or hernias noted. Normal bowel sounds, without guarding, and without rebound.   Msk:  Symmetrical without gross deformities. Normal posture. Extremities:  Without clubbing or edema. Neurologic:  Alert and  oriented x4;  grossly normal neurologically. Skin:  Intact without significant lesions or rashes. Cervical Nodes:  No significant cervical adenopathy. Psych:  Alert and cooperative. Normal mood and affect.  Impression/Plan: Christina Davenport is here for a colonoscopy to be performed for colon cancer screening purposes.  The risks of  the procedure including infection, bleed, or perforation as well as benefits, limitations, alternatives and imponderables have been reviewed with the patient. Questions have been answered. All parties agreeable.

## 2020-09-18 NOTE — Anesthesia Postprocedure Evaluation (Signed)
Anesthesia Post Note  Patient: Christina Davenport  Procedure(s) Performed: COLONOSCOPY WITH PROPOFOL  Patient location during evaluation: Phase II Anesthesia Type: General Level of consciousness: awake and alert and oriented Pain management: pain level controlled Vital Signs Assessment: post-procedure vital signs reviewed and stable Respiratory status: spontaneous breathing and respiratory function stable Cardiovascular status: blood pressure returned to baseline and stable Postop Assessment: no apparent nausea or vomiting Anesthetic complications: no   No notable events documented.   Last Vitals:  Vitals:   09/18/20 0727 09/18/20 0834  BP: (!) 158/90 100/64  Pulse: 99   Resp: 11 16  Temp: 37 C 37 C  SpO2: 98% 95%    Last Pain:  Vitals:   09/18/20 0834  TempSrc: Oral  PainSc: 0-No pain                 Hafsah Hendler C Nekayla Heider

## 2020-09-21 ENCOUNTER — Encounter (HOSPITAL_COMMUNITY): Payer: Self-pay | Admitting: Internal Medicine

## 2020-11-16 ENCOUNTER — Ambulatory Visit: Payer: 59 | Admitting: Family Medicine

## 2020-12-08 ENCOUNTER — Other Ambulatory Visit: Payer: Self-pay | Admitting: Family Medicine

## 2020-12-08 ENCOUNTER — Ambulatory Visit: Payer: 59 | Admitting: Family Medicine

## 2021-01-08 ENCOUNTER — Other Ambulatory Visit: Payer: Self-pay | Admitting: Family Medicine

## 2021-01-08 DIAGNOSIS — I10 Essential (primary) hypertension: Secondary | ICD-10-CM

## 2021-04-26 ENCOUNTER — Encounter: Payer: Self-pay | Admitting: Internal Medicine

## 2021-04-26 ENCOUNTER — Ambulatory Visit (INDEPENDENT_AMBULATORY_CARE_PROVIDER_SITE_OTHER): Payer: 59 | Admitting: Internal Medicine

## 2021-04-26 ENCOUNTER — Ambulatory Visit: Payer: 59 | Admitting: Family Medicine

## 2021-04-26 ENCOUNTER — Other Ambulatory Visit: Payer: Self-pay

## 2021-04-26 VITALS — BP 152/88 | HR 103 | Resp 18 | Ht 65.0 in | Wt 327.0 lb

## 2021-04-26 DIAGNOSIS — R42 Dizziness and giddiness: Secondary | ICD-10-CM

## 2021-04-26 DIAGNOSIS — H109 Unspecified conjunctivitis: Secondary | ICD-10-CM

## 2021-04-26 DIAGNOSIS — M7989 Other specified soft tissue disorders: Secondary | ICD-10-CM | POA: Insufficient documentation

## 2021-04-26 DIAGNOSIS — D1809 Hemangioma of other sites: Secondary | ICD-10-CM | POA: Diagnosis not present

## 2021-04-26 MED ORDER — OFLOXACIN 0.3 % OP SOLN: 1.0000 [drp] | Freq: Four times a day (QID) | OPHTHALMIC | 0 refills | Status: DC

## 2021-04-26 MED ORDER — MECLIZINE HCL 25 MG PO TABS: 25.0000 mg | ORAL_TABLET | Freq: Three times a day (TID) | ORAL | 0 refills | Status: DC | PRN

## 2021-04-26 NOTE — Patient Instructions (Signed)
Please continue taking Maxzide and Amlodipine-Valsartan as prescribed. ? ?Please use sequential compression device at home to help with swelling. ? ?Please continue to follow low salt diet and ambulate as tolerated. ? ?Try leg elevation and compression socks when possible. ? ?Please take Meclizine as needed for vertigo. ?

## 2021-04-26 NOTE — Assessment & Plan Note (Signed)
Chronic, likely due to prolonged standing/sitting ?Advised to perform leg elevation as tolerated ?Advised to use sequential compression devices while sitting at work ?Continue Maxide for HTN ?

## 2021-04-26 NOTE — Assessment & Plan Note (Signed)
Followed by neurosurgery, has had gamma knife stereotactic radiosurgery ?Advised to contact Neurosurgery office to get sooner MRI of brain as her dizziness, hearing problem and double vision could be from recurring mass ?

## 2021-04-26 NOTE — Progress Notes (Signed)
Acute Office Visit  Subjective:    Patient ID: Christina Davenport, female    DOB: [birth year], 51 y.o.   MRN: [id number]  Chief Complaint  Patient presents with   Acute Visit    Pt is having fluid retention is putting on weight also dealing with vertigo and balance is off with mucous discharge out of weak eye and hearing is off in good ear the longer she looks at computer screen she can no longer see     HPI Patient is in today for complaint of b/l leg swelling, which is chronic, worse recently.  She reports that she has to sit for prolonged times during her work, which worsens her leg swelling.  Of note, she is currently on Maxzide and amlodipine-valsartan for HTN.  Her BP was elevated today in the office, but her home BP readings have been around 120s/70s on most of the days.  She currently denies any chest pain, dyspnea or palpitations.  She complains of dizziness and right-sided hearing problem for the last few days.  Denies any ear pain or discharge currently.  Denies any fever, chills, nasal congestion, postnasal drip or sinus pressure.  She also complains of double vision, which is chronic. Of note, she has h/o glomus tumor of base of skull, for which she had stereotactic radiation in 2012 and 2021.  Her last MRI of the brain was in 11/21.  She is going to get MRI of brain and follow-up with neurosurgery in the next month.  She also complains of yellowish thick discharge from her left eye.  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  She uses artificial tears for it.  She has been having mild redness and burning of the left eye as well.  Past Medical History:  Diagnosis Date   Brain tumor (benign) ([place]) 2011   excision followed by radiation   CHF (congestive heart failure) ([place])    Phreesia 12/20/2019, patient denies today 08/08/20   Depression, major, single episode, moderate ([place]) 12/26/2019   PHQ 9 score of 12 in 11/2019   Diabetes mellitus type 2 in  obese ([place]) 12/09/2007   Qualifier: Diagnosis of  By: Moshe Cipro MD, Margaret     Diabetes mellitus without complication ([place])    Phreesia 12/20/2019   Diabetes mellitus, type 2 ([place])    diet controlled   Endometrial thickening on ultra sound 01/08/2012   Reports had pelvic exam in 2015 will send for record    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   History of brain tumor 12/28/2015   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       Hypertension    Obesity    Ovarian cyst 01/08/2012   PCO (polycystic ovaries)    S/P right oophorectomy 07/07/2015   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    Past Surgical History:  Procedure Laterality Date   BRAIN SURGERY  11/12   COLONOSCOPY WITH PROPOFOL N/A 09/18/2020   Procedure: COLONOSCOPY WITH PROPOFOL;  Surgeon: Eloise Harman, DO;  [facility];  Service: Endoscopy;  Laterality: N/A;  8:30am   LAPAROTOMY Bilateral 07/07/2015   Procedure: EXPLORATORY LAPAROTOMY  LEFT OVARIAN CYSTECTOMY, LEFT PARATUBAL CYSTECTOMY;  Surgeon: Servando Salina, MD;  Location: [facility];  Service: Gynecology;  Laterality: Bilateral;   repair of chiari malformation  05/02   SALPINGOOPHORECTOMY Right 07/07/2015   Procedure: RIGHT SALPINGO OOPHORECTOMY;  Surgeon: Servando Salina, MD;  Location: [facility];  Service: Gynecology;  Laterality: Right;   tumor removed  Brain, non cancerous   12/2009    Family History  Problem Relation Age of Onset   Chiari malformation Mother        requiring surgery    Hypertension Father    Hypertension Sister    Cancer Maternal Grandmother        kidney   Heart disease Maternal Grandmother    Cancer Paternal Grandmother        stomach   Cancer Paternal Grandfather        lung   Colon polyps Neg Hx    Colon cancer Neg Hx     Social History   Socioeconomic History   Marital status: Single    Spouse name: Not on file   Number of children: Not on file    Years of education: Not on file   Highest education level: Not on file  Occupational History   Not on file  Tobacco Use   Smoking status: Never   Smokeless tobacco: Never  Vaping Use   Vaping Use: Never used  Substance and Sexual Activity   Alcohol use: No   Drug use: No   Sexual activity: Yes    Birth control/protection: None  Other Topics Concern   Not on file  Social History Narrative   Not on file   Social Determinants of Health   Financial Resource Strain: Not on file  Food Insecurity: Not on file  Transportation Needs: Not on file  Physical Activity: Not on file  Stress: Not on file  Social Connections: Not on file  Intimate Partner Violence: Not on file    Outpatient Medications Prior to Visit  Medication Sig Dispense Refill   acetaminophen (TYLENOL) 500 MG tablet Take 500 mg by mouth every 6 (six) hours as needed for mild pain.     amLODipine (NORVASC) 5 MG tablet Take one tablet by mouth every evening at 9 pm (Patient taking differently: Take 5 mg by mouth every evening.) 90 tablet 2   amLODipine-valsartan (EXFORGE) 10-320 MG tablet Take 1 tablet by mouth daily. (Patient taking differently: Take 1 tablet by mouth in the morning.) 90 tablet 2   atorvastatin (LIPITOR) 20 MG tablet TAKE 1 TABLET(20 MG) BY MOUTH DAILY 90 tablet 2   Cholecalciferol 25 MCG (1000 UT) tablet Take 1,000 Units by mouth daily.     ferrous gluconate (FERGON) 324 MG tablet Take 324 mg by mouth daily.     hydroxypropyl methylcellulose / hypromellose (ISOPTO TEARS / GONIOVISC) 2.5 % ophthalmic solution Place 1 drop into both eyes 4 (four) times daily as needed for dry eyes.     Multiple Vitamins-Iron (MULTIVITAMINS WITH IRON) TABS tablet Take 1 tablet by mouth daily.     triamterene-hydrochlorothiazide (MAXZIDE) 75-50 MG tablet TAKE 1 TABLET BY MOUTH DAILY 90 tablet 2   White Petrolatum-Mineral Oil (EYE LUBRICANT) OINT Place 1 tablet into both eyes in the morning, at noon, and at bedtime.      polyethylene glycol-electrolytes (NULYTELY) 420 g solution As directed (Patient not taking: Reported on 04/26/2021) 4000 mL 0   No facility-administered medications prior to visit.    Allergies  Allergen Reactions   Other Itching   Enalapril Maleate Hives, Itching and Swelling   Metformin Nausea And Vomiting    abd pain,     Review of Systems  Constitutional:  Negative for chills and fever.  HENT:  Positive for hearing loss. Negative for congestion,  sinus pressure, sinus pain and sore throat.   Eyes:  Positive for discharge, redness and visual disturbance.  Respiratory:  Negative for cough and shortness of breath.   Cardiovascular:  Positive for leg swelling. Negative for chest pain and palpitations.  Gastrointestinal:  Negative for abdominal pain, diarrhea, nausea and vomiting.  Endocrine: Negative for polydipsia and polyuria.  Genitourinary:  Negative for dysuria and hematuria.  Musculoskeletal:  Negative for neck pain and neck stiffness.  Skin:  Negative for rash.  Neurological:  Positive for dizziness. Negative for weakness.  Psychiatric/Behavioral:  Negative for agitation and behavioral problems.       Objective:    Physical Exam Vitals reviewed.  Constitutional:      General: She is not in acute distress.    Appearance: She is obese. She is not diaphoretic.  HENT:     Head: Normocephalic and atraumatic.     Right Ear: There is no impacted cerumen.     Left Ear: There is no impacted cerumen.     Nose: Nose normal.     Mouth/Throat:     Mouth: Mucous membranes are moist.  Eyes:     General: No scleral icterus.       Left eye: Discharge present. Cardiovascular:     Rate and Rhythm: Normal rate and regular rhythm.     Pulses: Normal pulses.     Heart sounds: Normal heart sounds. No murmur heard. Pulmonary:     Breath sounds: Normal breath sounds. No wheezing or rales.  Musculoskeletal:     Cervical back: Neck supple. No tenderness.     Right lower leg: Edema (2+)  present.     Left lower leg: Edema (2+) present.  Skin:    General: Skin is warm.     Findings: No rash.  Neurological:     General: No focal deficit present.     Mental Status: She is alert and oriented to person, place, and time.  Psychiatric:        Mood and Affect: Mood normal.        Behavior: Behavior normal.    BP (!) 152/88 (BP Location: Left Arm, Patient Position: Sitting, Cuff Size: Normal)    Pulse (!) 103    Resp 18    Ht 5\' 5"  (1.651 m)    Wt (!) 327 lb (148.3 kg)    SpO2 90%    BMI 54.42 kg/m  Wt Readings from Last 3 Encounters:  04/26/21 (!) 327 lb (148.3 kg)  09/12/20 (!) 307 lb (139.3 kg)  08/16/20 (!) 307 lb (139.3 kg)        Assessment & Plan:   Problem List Items Addressed This Visit       Other   Glomus tumor of base of skull    Followed by neurosurgery, has had gamma knife stereotactic radiosurgery Advised to contact Neurosurgery office to get sooner MRI of brain as her dizziness, hearing problem and double vision could be from recurring mass      Leg swelling - Primary    Chronic, likely due to prolonged standing/sitting Advised to perform leg elevation as tolerated Advised to use sequential compression devices while sitting at work [employer] for HTN      Other Visit Diagnoses     Vertigo     Meclizine as needed for dizziness Advised to maintain adequate hydration Avoid skipping meals   Relevant Medications   meclizine (ANTIVERT) 25 MG tablet   Bacterial conjunctivitis  Left eye discharge Unable to close left eye, a risk factor for conjunctivitis and corneal ulcer Started ofloxacin eyedrop for now    Relevant Medications   ofloxacin (OCUFLOX) 0.3 % ophthalmic solution        Meds ordered this encounter  Medications   meclizine (ANTIVERT) 25 MG tablet    Sig: Take 1 tablet (25 mg total) by mouth 3 (three) times daily as needed for dizziness.    Dispense:  30 tablet    Refill:  0   ofloxacin (OCUFLOX) 0.3 % ophthalmic  solution    Sig: Place 1 drop into the left eye 4 (four) times daily.    Dispense:  5 mL    Refill:  0     Jonique Kulig Keith Rake, MD

## 2021-05-08 ENCOUNTER — Telehealth: Payer: Self-pay

## 2021-05-08 NOTE — Telephone Encounter (Signed)
Patient was seen last week in the office still retaining the fluid, stomach is hard in area and flaking up in areas / still extreme dizzy.  Call  [phone]  ?

## 2021-05-08 NOTE — Telephone Encounter (Signed)
Patient aware to go to ER stated she will go to [facility] [place] today  ?

## 2021-06-01 ENCOUNTER — Encounter: Payer: Self-pay | Admitting: Family Medicine

## 2021-06-04 ENCOUNTER — Other Ambulatory Visit: Payer: Self-pay | Admitting: *Deleted

## 2021-06-04 DIAGNOSIS — E785 Hyperlipidemia, unspecified: Secondary | ICD-10-CM

## 2021-06-04 DIAGNOSIS — I10 Essential (primary) hypertension: Secondary | ICD-10-CM

## 2021-06-04 DIAGNOSIS — E1169 Type 2 diabetes mellitus with other specified complication: Secondary | ICD-10-CM

## 2021-06-04 NOTE — Telephone Encounter (Signed)
Pt advised appt made labs ordered will have drawn before visit  ?

## 2021-06-08 ENCOUNTER — Ambulatory Visit: Payer: 59 | Admitting: Family Medicine

## 2021-06-08 ENCOUNTER — Telehealth: Payer: Self-pay | Admitting: Family Medicine

## 2021-06-08 NOTE — Telephone Encounter (Signed)
Patient called in regard to new med given from most recent ER visit. ?Patient states ER wants patient to contact PCP before starting new med. ? ?Wants a call back to discuss new med . ?

## 2021-06-08 NOTE — Telephone Encounter (Signed)
Pt aware.

## 2021-06-08 NOTE — Telephone Encounter (Signed)
Had to go to ER for edema in her legs and was prescribed lasix '40mg'$  but not potassium and was told to ask her pcp if it was ok to take with her other medications, if so she needs it  ?

## 2021-06-12 ENCOUNTER — Telehealth: Payer: Self-pay | Admitting: Family Medicine

## 2021-06-12 NOTE — Telephone Encounter (Signed)
Labs in system patient aware  ?

## 2021-06-12 NOTE — Telephone Encounter (Signed)
Patient call in about lab orders. ? ?Lab tech was unable to pull labs today, patient wants to ensure that labs are in system so that she can get them drawn tomorrow at [facility] on [address]. ? ?Also wants a call back when sent. ?

## 2021-06-14 ENCOUNTER — Ambulatory Visit (INDEPENDENT_AMBULATORY_CARE_PROVIDER_SITE_OTHER): Payer: 59 | Admitting: Family Medicine

## 2021-06-14 ENCOUNTER — Encounter: Payer: Self-pay | Admitting: Family Medicine

## 2021-06-14 VITALS — BP 187/94 | HR 111 | Resp 16 | Ht 65.0 in | Wt 335.0 lb

## 2021-06-14 DIAGNOSIS — R600 Localized edema: Secondary | ICD-10-CM

## 2021-06-14 DIAGNOSIS — M7989 Other specified soft tissue disorders: Secondary | ICD-10-CM

## 2021-06-14 DIAGNOSIS — R5383 Other fatigue: Secondary | ICD-10-CM | POA: Diagnosis not present

## 2021-06-14 DIAGNOSIS — E1169 Type 2 diabetes mellitus with other specified complication: Secondary | ICD-10-CM

## 2021-06-14 DIAGNOSIS — I10 Essential (primary) hypertension: Secondary | ICD-10-CM | POA: Diagnosis not present

## 2021-06-14 DIAGNOSIS — N1831 Chronic kidney disease, stage 3a: Secondary | ICD-10-CM | POA: Diagnosis not present

## 2021-06-14 LAB — CMP14+EGFR
ALT: 36 IU/L — ABNORMAL HIGH (ref 0–32)
AST: 34 IU/L (ref 0–40)
Albumin/Globulin Ratio: 1.1 — ABNORMAL LOW (ref 1.2–2.2)
Albumin: 4.1 g/dL (ref 3.8–4.8)
Alkaline Phosphatase: 63 IU/L (ref 44–121)
BUN/Creatinine Ratio: 21 (ref 9–23)
BUN: 31 mg/dL — ABNORMAL HIGH (ref 6–24)
Bilirubin Total: 0.7 mg/dL (ref 0.0–1.2)
CO2: 26 mmol/L (ref 20–29)
Calcium: 9.1 mg/dL (ref 8.7–10.2)
Chloride: 91 mmol/L — ABNORMAL LOW (ref 96–106)
Creatinine, Ser: 1.51 mg/dL — ABNORMAL HIGH (ref 0.57–1.00)
Globulin, Total: 3.6 g/dL (ref 1.5–4.5)
Glucose: 100 mg/dL — ABNORMAL HIGH (ref 70–99)
Potassium: 4.8 mmol/L (ref 3.5–5.2)
Sodium: 141 mmol/L (ref 134–144)
Total Protein: 7.7 g/dL (ref 6.0–8.5)
eGFR: 42 mL/min/{1.73_m2} — ABNORMAL LOW (ref >59)

## 2021-06-14 LAB — LIPID PANEL
Chol/HDL Ratio: 2.2 ratio (ref 0.0–4.4)
Cholesterol, Total: 128 mg/dL (ref 100–199)
HDL: 57 mg/dL (ref >39)
LDL Chol Calc (NIH): 57 mg/dL (ref 0–99)
Triglycerides: 65 mg/dL (ref 0–149)
VLDL Cholesterol Cal: 14 mg/dL (ref 5–40)

## 2021-06-14 LAB — HEMOGLOBIN A1C
Est. average glucose Bld gHb Est-mCnc: 143 mg/dL
Hgb A1c MFr Bld: 6.6 % — ABNORMAL HIGH (ref 4.8–5.6)

## 2021-06-14 MED ORDER — FUROSEMIDE 20 MG PO TABS: 20.0000 mg | ORAL_TABLET | Freq: Every day | ORAL | 1 refills | Status: DC

## 2021-06-14 NOTE — Assessment & Plan Note (Addendum)
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

## 2021-06-14 NOTE — Patient Instructions (Signed)
F/U in office with mD in 8 to 10  weeks, call if you need me before ? ?Continue furosemide 20 mg daily and medications you are currently taking ?You are referred urgently to Nephrology and also to the Cardiologist at the advanced hypertension clinic, Dr Radford Pax ? ?Your health needs a re a priority, please see about getting FMLA from your job as you will havee several appointments coming up which you need to keep ? ?Thanks for choosing [facility], we consider it a privelige to serve you. ? ?

## 2021-06-17 ENCOUNTER — Encounter: Payer: Self-pay | Admitting: Family Medicine

## 2021-06-17 DIAGNOSIS — R5383 Other fatigue: Secondary | ICD-10-CM | POA: Insufficient documentation

## 2021-06-17 DIAGNOSIS — R600 Localized edema: Secondary | ICD-10-CM | POA: Insufficient documentation

## 2021-06-17 NOTE — Assessment & Plan Note (Signed)
Ms. Hamor is reminded of the importance of commitment to daily physical activity for 30 minutes or more, as able and the need to limit carbohydrate intake to 30 to 60 grams per meal to help with blood sugar control.  ? ?Ms. Vos is reminded of the importance of daily foot exam, annual eye examination, and good blood sugar, blood pressure and cholesterol control ?Stable and unchanged , continue dietary control. ? ? ?  Latest Ref Rng & Units 06/13/2021  ?  8:18 AM 09/12/2020  ? 11:59 AM 12/22/2019  ? 10:22 AM 10/12/2018  ?  7:04 AM 10/29/2016  ?  4:08 PM  ?Diabetic Labs  ?HbA1c 4.8 - 5.6 % 6.6    6.6   6.5     ?Microalbumin mg/dL   6.2    1.2    ?Micro/Creat Ratio <30 mcg/mg creat   37    6    ?Chol 100 - 199 mg/dL 128    197   150     ?HDL >39 mg/dL 57    55   54     ?Calc LDL 0 - 99 mg/dL 57    118   78     ?Triglycerides 0 - 149 mg/dL 65    125   93     ?Creatinine 0.57 - 1.00 mg/dL 1.51   1.49   1.33   1.44     ? ? ?  06/14/2021  ?  3:38 PM 04/26/2021  ?  3:54 PM 09/18/2020  ?  8:34 AM 09/18/2020  ?  7:27 AM 09/12/2020  ?  8:23 AM 09/12/2020  ?  8:22 AM 08/16/2020  ?  5:07 PM  ?BP/Weight  ?Systolic BP 174 081 448 185 158  136  ?Diastolic BP 94 88 64 90 88  84  ?Wt. (Lbs) 335 327    307   ?BMI 55.75 kg/m2 54.42 kg/m2    51.09 kg/m2   ? ? ?  02/03/2020  ?  4:00 PM 02/21/2015  ? 10:45 AM  ?Foot/eye exam completion dates  ?Foot Form Completion Done Done  ? ? ? ? ? ?

## 2021-06-17 NOTE — Progress Notes (Signed)
? ?Christina Davenport     MRN: [id number]      DOB: 1971-02-16 ? ? ?HPI ?Christina Davenport is here for follow up from recent ED visit on 4/13 when she presented  with leg swelling, weight gain and shortness of breath. She has responded to lasix administered , but is still very symptomatic. States home P readings are good ?Has longstanding poorly controlled severe HTN and now also has CKD. Needs management of HTN in advanced HTN clinic as wells cardiology eval for heart failure and heart disease ?Ne fo her is that she will also now establish with Nephrology ?She is overwhelmed , scared and frustrated , she understands and accepts the need to be diligent in her health care and  to attend all appointments. Have advised that she get FMLA for her job to attend to her health care needs ?ROS ?Denies recent fever or chills. ?Denies sinus pressure, nasal congestion, ear pain or sore throat. ?Denies chest congestion, productive cough or wheezing. ?Denies chest pains, palpitations and leg swelling ?Denies abdominal pain, nausea, vomiting,diarrhea or constipation.   ?Denies dysuria, frequency, hesitancy or incontinence. ?C/o knee pain and swelling and marked limitation in mobility, can barely get into her  ehicle and is transported by her Mother to this visit ?Marland Kitchen ?Denies skin break down or rash. ? ? ?PE ? ?BP (!) 187/94   Pulse (!) 111   Resp 16   Ht '5\' 5"'$  (1.651 m)   Wt (!) 335 lb (152 kg)   SpO2 93%   BMI 55.75 kg/m?  ? ?Patient alert and oriented . ? ?HEENT:  facial asymmetry, EOMI,     Neck supple . ? ?Chest: decreased air entry  ?CVS: S1, S2 no murmurs, no S3.Regular rate. ? ?ABD: Soft non tender.  ? ?Ext: 3 plus bilateral edema ? ?MS: Adequate ROM spine, shoulders, hips and  reduced in knees. ? ?Skin: Intact, no ulcerations or rash noted. ? ?Psych: Good eye contact, normal affect. Memory intact anxious and mildly  depressed appearing. ? ?CNS: CN 2-12 intact, power,  normal throughout.no focal deficits noted. ? ? ?Assessment  & Plan ? ?CKD (chronic kidney disease) stage 3, GFR 30-59 ml/min (HCC) ?Excess swellingwith deterioration in renal unction, has longstanding malignannt HTn for over 10 years refer nephrology ? ?Leg swelling ?bilateral edema , with fatigue in setting of chronic uncontrolled/ poorly controlled hTN, likely has heart failure, referring to HTN clinic urgently ? ?Morbid obesity ? ?Patient re-educated about  the importance of commitment to a  minimum of 150 minutes of exercise per week as able. ? ?The importance of healthy food choices with portion control discussed, as well as eating regularly and within a 12 hour window most days. ?The need to choose "clean , green" food 50 to 75% of the time is discussed, as well as to make water the primary drink and set a goal of 64 ounces water daily. ?Excessive weight gain likely because of fluid retention, continue daily lasix ?  ? ?  06/14/2021  ?  3:38 PM 04/26/2021  ?  3:54 PM 09/12/2020  ?  8:22 AM  ?Weight /BMI  ?Weight 335 lb 327 lb 307 lb  ?Height '5\' 5"'$  (1.651 m) '5\' 5"'$  (1.651 m) '5\' 5"'$  (1.651 m)  ?BMI 55.75 kg/m2 54.42 kg/m2 51.09 kg/m2  ? ? ? ? ?Type 2 diabetes mellitus with other specified complication ([place]) ?Christina Davenport is reminded of the importance of commitment to daily physical activity for 30 minutes or more, as  able and the need to limit carbohydrate intake to 30 to 60 grams per meal to help with blood sugar control.  ? ?Christina Davenport is reminded of the importance of daily foot exam, annual eye examination, and good blood sugar, blood pressure and cholesterol control ?Stable and unchanged , continue dietary control. ? ? ?  Latest Ref Rng & Units 06/13/2021  ?  8:18 AM 09/12/2020  ? 11:59 AM 12/22/2019  ? 10:22 AM 10/12/2018  ?  7:04 AM 10/29/2016  ?  4:08 PM  ?Diabetic Labs  ?HbA1c 4.8 - 5.6 % 6.6    6.6   6.5     ?Microalbumin mg/dL   6.2    1.2    ?Micro/Creat Ratio <30 mcg/mg creat   37    6    ?Chol 100 - 199 mg/dL 128    197   150     ?HDL >39 mg/dL 57    55   54     ?Calc  LDL 0 - 99 mg/dL 57    118   78     ?Triglycerides 0 - 149 mg/dL 65    125   93     ?Creatinine 0.57 - 1.00 mg/dL 1.51   1.49   1.33   1.44     ? ? ?  06/14/2021  ?  3:38 PM 04/26/2021  ?  3:54 PM 09/18/2020  ?  8:34 AM 09/18/2020  ?  7:27 AM 09/12/2020  ?  8:23 AM 09/12/2020  ?  8:22 AM 08/16/2020  ?  5:07 PM  ?BP/Weight  ?Systolic BP 509 326 712 458 158  136  ?Diastolic BP 94 88 64 90 88  84  ?Wt. (Lbs) 335 327    307   ?BMI 55.75 kg/m2 54.42 kg/m2    51.09 kg/m2   ? ? ?  02/03/2020  ?  4:00 PM 02/21/2015  ? 10:45 AM  ?Foot/eye exam completion dates  ?Foot Form Completion Done Done  ? ? ? ? ? ? ?

## 2021-06-17 NOTE — Assessment & Plan Note (Addendum)
?  Patient re-educated about  the importance of commitment to a  minimum of 150 minutes of exercise per week as able. ? ?The importance of healthy food choices with portion control discussed, as well as eating regularly and within a 12 hour window most days. ?The need to choose "clean , green" food 50 to 75% of the time is discussed, as well as to make water the primary drink and set a goal of 64 ounces water daily. ?Excessive weight gain likely because of fluid retention, continue daily lasix ?  ? ?  06/14/2021  ?  3:38 PM 04/26/2021  ?  3:54 PM 09/12/2020  ?  8:22 AM  ?Weight /BMI  ?Weight 335 lb 327 lb 307 lb  ?Height '5\' 5"'$  (1.651 m) '5\' 5"'$  (1.651 m) '5\' 5"'$  (1.651 m)  ?BMI 55.75 kg/m2 54.42 kg/m2 51.09 kg/m2  ? ? ? ?

## 2021-06-17 NOTE — Assessment & Plan Note (Signed)
bilateral edema , with fatigue in setting of chronic uncontrolled/ poorly controlled hTN, likely has heart failure, referring to HTN clinic urgently ?

## 2021-06-20 ENCOUNTER — Telehealth: Payer: Self-pay

## 2021-06-20 NOTE — Telephone Encounter (Signed)
FMLA forms   Copied Noted sleeved 

## 2021-06-22 ENCOUNTER — Other Ambulatory Visit: Payer: Self-pay | Admitting: Nephrology

## 2021-06-22 DIAGNOSIS — I129 Hypertensive chronic kidney disease with stage 1 through stage 4 chronic kidney disease, or unspecified chronic kidney disease: Secondary | ICD-10-CM

## 2021-06-22 DIAGNOSIS — E1122 Type 2 diabetes mellitus with diabetic chronic kidney disease: Secondary | ICD-10-CM

## 2021-06-29 ENCOUNTER — Other Ambulatory Visit: Payer: Self-pay

## 2021-06-29 ENCOUNTER — Telehealth: Payer: Self-pay | Admitting: Family Medicine

## 2021-06-29 MED ORDER — FUROSEMIDE 20 MG PO TABS: 20.0000 mg | ORAL_TABLET | Freq: Every day | ORAL | 1 refills | Status: DC

## 2021-06-29 NOTE — Telephone Encounter (Signed)
Refill sent.

## 2021-06-29 NOTE — Telephone Encounter (Signed)
Pt called stating that when she was last week that none of her medications went through to the pharmacy. Can you please resend? ? ? ?[facility]  ?

## 2021-07-04 ENCOUNTER — Ambulatory Visit (HOSPITAL_COMMUNITY)
Admission: RE | Admit: 2021-07-04 | Discharge: 2021-07-04 | Disposition: A | Discharge status: Home / Self Care | Payer: 59 | Source: Ambulatory Visit | Attending: Nephrology | Admitting: Nephrology

## 2021-07-04 DIAGNOSIS — E1122 Type 2 diabetes mellitus with diabetic chronic kidney disease: Secondary | ICD-10-CM | POA: Diagnosis present

## 2021-07-04 DIAGNOSIS — I129 Hypertensive chronic kidney disease with stage 1 through stage 4 chronic kidney disease, or unspecified chronic kidney disease: Secondary | ICD-10-CM | POA: Diagnosis present

## 2021-07-09 ENCOUNTER — Encounter: Payer: Self-pay | Admitting: Family Medicine

## 2021-07-09 ENCOUNTER — Telehealth: Payer: Self-pay | Admitting: Family Medicine

## 2021-07-09 DIAGNOSIS — R0683 Snoring: Secondary | ICD-10-CM

## 2021-07-09 DIAGNOSIS — I10 Essential (primary) hypertension: Secondary | ICD-10-CM

## 2021-07-09 DIAGNOSIS — Z0279 Encounter for issue of other medical certificate: Secondary | ICD-10-CM

## 2021-07-09 NOTE — Telephone Encounter (Signed)
Direct call to pt ?Has had one nephrology appt April 28, and her next appt with him is June 1, . She had renal US on 05/11 which is normal ?Next appt is with cardiology 05/26 ?Has had renal labs, no direct communication she reports. ?She does report that she snores and awakens herself snoring at times, Nephrology had correctly mentioned the need for sleep study, since not yet referred I will refer today to [place] [facility] and ask for urgent appt  ?Today reports that swelling is worse extends to her thighs, whenever she sits and has les hanging down the swellin is waorse. This weekend sine she was up and about today she is miserable. I have repeatedly advised that she reach out to Nephrology for any med adjustments or sooner appt that will help to address the fluid retention/ swelling ? ?Denies PND, needs only one pillow ?She reports normal BP with Nephrology and reports normal BP at home 130/78 ?Since BP is reported a s normal on current meds, I have advised her to keep appt with Dr Harl Bowie, np need to rer to advanced hypertension clinic ?Urgent referral is sent to pulmonary. I have reviewed on the phone the information tha will be put on her fMLA, she understands and agrees ?Form will be faxed today ? ?

## 2021-07-10 NOTE — Telephone Encounter (Signed)
Forms faxed

## 2021-07-16 ENCOUNTER — Telehealth: Payer: Self-pay

## 2021-07-16 NOTE — Telephone Encounter (Signed)
Updated FMLA forms  # 7  # 8  Copied Noted sleeved

## 2021-07-16 NOTE — Progress Notes (Unsigned)
$'@Patient'e$  ID: Christina Davenport, female    DOB: [birth year], 51 y.o.   MRN: [id number]  No chief complaint on file.   Referring provider: Fayrene Helper, MD  HPI: 51 year old female, never smoked. PMH significant for CKD stage 3, type 2 diabetes, hypertension, hyperlipidemia, iron deficiency anemia, obesity.  07/17/2021 Patient presents today for sleep consult.      Allergies  Allergen Reactions   Other Itching   Enalapril Maleate Hives, Itching and Swelling   Metformin Nausea And Vomiting    abd pain,     Immunization History  Administered Date(s) Administered   Influenza Whole 01/19/2006   Influenza,inj,Quad PF,6+ Mos 02/03/2014, 02/21/2015, 12/28/2015, 10/29/2016, 12/22/2019, 12/24/2020   Influenza-Unspecified 11/19/2018   Moderna Sars-Covid-2 Vaccination 05/03/2019, 05/31/2019, 12/31/2019   Pneumococcal Conjugate-13 03/17/2014   Pneumococcal Polysaccharide-23 12/22/2019   Td 02/26/2005   Tdap 05/25/2015    Past Medical History:  Diagnosis Date   Brain tumor (benign) ([place]) 2011   excision followed by radiation   CHF (congestive heart failure) ([place])    Phreesia 12/20/2019, patient denies today 08/08/20   Depression, major, single episode, moderate ([place]) 12/26/2019   PHQ 9 score of 12 in 11/2019   Diabetes mellitus type 2 in obese ([place]) 12/09/2007   Qualifier: Diagnosis of  By: Moshe Cipro MD, Margaret     Diabetes mellitus without complication ([place])    Phreesia 12/20/2019   Diabetes mellitus, type 2 ([place])    diet controlled   Endometrial thickening on ultra sound 01/08/2012   Reports had pelvic exam in 2015 will send for record    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   History of brain tumor 12/28/2015   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     amLODipine (NORVASC) 5 MG tablet Take one tablet by mouth every evening at 9 pm (Patient taking differently: Take 5 mg by mouth every evening.) 90 tablet 2   amLODipine-valsartan (EXFORGE) 10-320 MG tablet Take 1 tablet by mouth daily. (Patient taking differently: Take 1 tablet by mouth in the morning.) 90 tablet 2   atorvastatin (LIPITOR) 20 MG tablet TAKE 1 TABLET(20 MG) BY MOUTH DAILY 90 tablet 2   Cholecalciferol 25 MCG (1000 UT) tablet Take 1,000 Units by mouth daily.     ferrous gluconate (FERGON) 324 MG tablet Take 324 mg by mouth daily.     furosemide (LASIX) 20 MG tablet Take 1 tablet (20 mg total) by mouth daily. 30 tablet 1   hydroxypropyl methylcellulose / hypromellose (ISOPTO TEARS / GONIOVISC) 2.5 % ophthalmic solution Place 1 drop into both eyes 4 (four) times daily as needed for dry eyes.     meclizine (ANTIVERT) 25 MG tablet Take 1 tablet (25 mg total) by mouth 3 (three) times daily as needed for dizziness. 30 tablet 0   Multiple Vitamins-Iron (MULTIVITAMINS WITH IRON) TABS tablet Take 1 tablet by mouth daily.  ofloxacin (OCUFLOX) 0.3 % ophthalmic solution Place 1 drop into the left eye 4 (four) times daily. 5 mL 0   triamterene-hydrochlorothiazide (MAXZIDE) 75-50 MG tablet TAKE 1 TABLET BY MOUTH DAILY 90 tablet 2   White Petrolatum-Mineral Oil (EYE LUBRICANT) OINT Place 1 tablet into both eyes in the morning, at noon, and at bedtime.     No facility-administered medications prior to visit.      Review of Systems  Review of Systems   Physical Exam  There were no vitals taken for this visit. Physical Exam   Lab Results:  CBC     Component Value Date/Time   WBC 6.9 09/12/2020 1159   RBC 4.82 09/12/2020 1159   HGB 14.5 09/12/2020 1159   HCT 43.0 09/12/2020 1159   PLT 359 09/12/2020 1159   MCV 89.2 09/12/2020 1159   MCH 30.1 09/12/2020 1159   MCHC 33.7 09/12/2020 1159   RDW 14.2 09/12/2020 1159   LYMPHSABS 1.4 09/12/2020 1159   MONOABS 0.5 09/12/2020 1159   EOSABS 0.2 09/12/2020 1159   BASOSABS 0.0 09/12/2020 1159    BMET    Component Value Date/Time   NA 141 06/13/2021 0818   K 4.8 06/13/2021 0818   CL 91 (L) 06/13/2021 0818   CO2 26 06/13/2021 0818   GLUCOSE 100 (H) 06/13/2021 0818   GLUCOSE 93 09/12/2020 1159   BUN 31 (H) 06/13/2021 0818   CREATININE 1.51 (H) 06/13/2021 0818   CREATININE 1.33 (H) 12/22/2019 1022   CALCIUM 9.1 06/13/2021 0818   GFRNONAA 43 (L) 09/12/2020 1159   GFRNONAA 47 (L) 12/22/2019 1022   GFRAA 55 (L) 12/22/2019 1022    BNP No results found for: BNP  ProBNP No results found for: PROBNP  Imaging: US RENAL  Result Date: 07/05/2021 CLINICAL DATA:  Chronic renal disease EXAM: RENAL / URINARY TRACT ULTRASOUND COMPLETE COMPARISON:  None Available. FINDINGS: Right Kidney: Renal measurements: 9.0 x 4.9 x 4.9 cm = volume: 113 mL. Echogenicity within normal limits. No mass or hydronephrosis visualized. Left Kidney: Renal measurements: 9.7 x 5.3 x 3.9 cm = volume: 105 mL. Echogenicity within normal limits. No mass or hydronephrosis visualized. Bladder: Appears normal for degree of bladder distention. Other: None. IMPRESSION: No hydronephrosis. Electronically Signed   By: Lovey Newcomer M.D.   On: 07/05/2021 16:11     Assessment & Plan:   No problem-specific Assessment & Plan notes found for this encounter.     Martyn Ehrich, NP 07/16/2021

## 2021-07-17 ENCOUNTER — Ambulatory Visit (INDEPENDENT_AMBULATORY_CARE_PROVIDER_SITE_OTHER): Payer: 59 | Admitting: Primary Care

## 2021-07-17 ENCOUNTER — Encounter: Payer: Self-pay | Admitting: Primary Care

## 2021-07-17 VITALS — BP 130/82 | HR 82 | Temp 98.6°F | Ht 65.0 in | Wt 342.0 lb

## 2021-07-17 DIAGNOSIS — G4719 Other hypersomnia: Secondary | ICD-10-CM

## 2021-07-17 DIAGNOSIS — Z9189 Other specified personal risk factors, not elsewhere classified: Secondary | ICD-10-CM | POA: Diagnosis not present

## 2021-07-17 DIAGNOSIS — R600 Localized edema: Secondary | ICD-10-CM | POA: Diagnosis not present

## 2021-07-17 NOTE — Assessment & Plan Note (Signed)
-   Patient has +2-3 BLE swelling. She is taking '20mg'$  lasix daily. Has an apt with cardiology end of the month

## 2021-07-17 NOTE — Patient Instructions (Addendum)
Sleep apnea is defined as period of 10 seconds or longer when you stop breathing at night. This can happen multiple times a night. Dx sleep apnea is when this occurs more than 5 times an hour.    Mild OSA 5-15 apneic events an hour Moderate OSA 15-30 apneic events an hour Severe OSA > 30 apneic events an hour   Untreated sleep apnea puts you at higher risk for cardiac arrhythmias, pulmonary HTN, stroke and diabetes   Treatment options include weight loss, side sleeping position, oral appliance, CPAP therapy or referral to ENT for possible surgical options    Recommendations: Focus on side sleeping position Work on weight loss efforts  Do not drive if experiencing excessive daytime sleepiness of fatigue    Orders: Split night sleep study re: daytime sleepiness/at risk for sleep apnea   Follow-up: 6 week visit with Eustaquio Maize NP to review sleep study results and treatment if needed    Sleep Apnea Sleep apnea affects breathing during sleep. It causes breathing to stop for 10 seconds or more, or to become shallow. People with sleep apnea usually snore loudly. It can also increase the risk of: Heart attack. Stroke. Being very overweight (obese). Diabetes. Heart failure. Irregular heartbeat. High blood pressure. The goal of treatment is to help you breathe normally again. What are the causes?  The most common cause of this condition is a collapsed or blocked airway. There are three kinds of sleep apnea: Obstructive sleep apnea. This is caused by a blocked or collapsed airway. Central sleep apnea. This happens when the brain does not send the right signals to the muscles that control breathing. Mixed sleep apnea. This is a combination of obstructive and central sleep apnea. What increases the risk? Being overweight. Smoking. Having a small airway. Being older. Being female. Drinking alcohol. Taking medicines to calm yourself (sedatives or tranquilizers). Having family members with  the condition. Having a tongue or tonsils that are larger than normal. What are the signs or symptoms? Trouble staying asleep. Loud snoring. Headaches in the morning. Waking up gasping. Dry mouth or sore throat in the morning. Being sleepy or tired during the day. If you are sleepy or tired during the day, you may also: Not be able to focus your mind (concentrate). Forget things. Get angry a lot and have mood swings. Feel sad (depressed). Have changes in your personality. Have less interest in sex, if you are female. Be unable to have an erection, if you are female. How is this treated?  Sleeping on your side. Using a medicine to get rid of mucus in your nose (decongestant). Avoiding the use of alcohol, medicines to help you relax, or certain pain medicines (narcotics). Losing weight, if needed. Changing your diet. Quitting smoking. Using a machine to open your airway while you sleep, such as: An oral appliance. This is a mouthpiece that shifts your lower jaw forward. A CPAP device. This device blows air through a mask when you breathe out (exhale). An EPAP device. This has valves that you put in each nostril. A BIPAP device. This device blows air through a mask when you breathe in (inhale) and breathe out. Having surgery if other treatments do not work. Follow these instructions at home: Lifestyle Make changes that your doctor recommends. Eat a healthy diet. Lose weight if needed. Avoid alcohol, medicines to help you relax, and some pain medicines. Do not smoke or use any products that contain nicotine or tobacco. If you need help quitting, ask your  doctor. General instructions Take over-the-counter and prescription medicines only as told by your doctor. If you were given a machine to use while you sleep, use it only as told by your doctor. If you are having surgery, make sure to tell your doctor you have sleep apnea. You may need to bring your device with you. Keep all  follow-up visits. Contact a doctor if: The machine that you were given to use during sleep bothers you or does not seem to be working. You do not get better. You get worse. Get help right away if: Your chest hurts. You have trouble breathing in enough air. You have an uncomfortable feeling in your back, arms, or stomach. You have trouble talking. One side of your body feels weak. A part of your face is hanging down. These symptoms may be an emergency. Get help right away. Call your local emergency services (911 in the [place] Do not wait to see if the symptoms will go away. Do not drive yourself to the hospital. Summary This condition affects breathing during sleep. The most common cause is a collapsed or blocked airway. The goal of treatment is to help you breathe normally while you sleep. This information is not intended to replace advice given to you by your health care provider. Make sure you discuss any questions you have with your health care provider. Document Revised: 09/20/2020 Document Reviewed: 01/21/2020 Elsevier Patient Education  [place].

## 2021-07-17 NOTE — Assessment & Plan Note (Addendum)
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 At risk for obstructive/central sleep apnea. She has symptoms of daytime sleepiness and restless sleep. Epworth score 15. BMI 56. Needs split night sleep study, high suspicion she will need CPAP vs BIPAP. She will need to be fitted of mask in lab d/t facial paralysis. Discussed risk of untreated sleep apnea including cardiac arrhythmias, pulm HTN, stroke, DM. We briefly reviewed treatment options. Encouraged patient to work on weight loss efforts and focus on side sleeping position/elevate head of bed. Advised against driving if experiencing excessive daytime sleepiness.  FU in 6 weeks in office to review sleep study results and treatment if needed.

## 2021-07-20 ENCOUNTER — Telehealth: Payer: Self-pay | Admitting: Cardiology

## 2021-07-20 ENCOUNTER — Encounter: Payer: Self-pay | Admitting: Cardiology

## 2021-07-20 ENCOUNTER — Ambulatory Visit (INDEPENDENT_AMBULATORY_CARE_PROVIDER_SITE_OTHER): Payer: 59 | Admitting: Cardiology

## 2021-07-20 VITALS — BP 162/120 | HR 87 | Ht 65.0 in | Wt 344.2 lb

## 2021-07-20 DIAGNOSIS — I10 Essential (primary) hypertension: Secondary | ICD-10-CM

## 2021-07-20 DIAGNOSIS — R0602 Shortness of breath: Secondary | ICD-10-CM

## 2021-07-20 DIAGNOSIS — Z79899 Other long term (current) drug therapy: Secondary | ICD-10-CM

## 2021-07-20 DIAGNOSIS — R600 Localized edema: Secondary | ICD-10-CM

## 2021-07-20 MED ORDER — FUROSEMIDE 20 MG PO TABS: 60.0000 mg | ORAL_TABLET | Freq: Every day | ORAL | 6 refills | Status: DC

## 2021-07-20 MED ORDER — SPIRONOLACTONE 25 MG PO TABS: 12.5000 mg | ORAL_TABLET | Freq: Every day | ORAL | 6 refills | Status: DC

## 2021-07-20 NOTE — Patient Instructions (Addendum)
Medication Instructions:  Increase Lasix to '60mg'$  daily Stop Maxzide (Triamterene/HCTZ) Begin Aldactone 12.'5mg'$  daily  Continue all other medications.     Labwork: BMET, BNP, MG - orders given today Please do in 2 weeks (around 08/03/21)  Testing/Procedures: Your physician has requested that you have an echocardiogram. Echocardiography is a painless test that uses sound waves to create images of your heart. It provides your doctor with information about the size and shape of your heart and how well your heart's chambers and valves are working. This procedure takes approximately one hour. There are no restrictions for this procedure.   Follow-Up: 2 months   Any Other Special Instructions Will Be Listed Below (If Applicable). Please call the office or mychart message the office on Tuesday to update o weights.   If you need a refill on your cardiac medications before your next appointment, please call your pharmacy.

## 2021-07-20 NOTE — Telephone Encounter (Signed)
Checking percert on the following patient for testing scheduled at [facility].    ECHO -07/27/2021

## 2021-07-20 NOTE — Progress Notes (Signed)
Clinical Summary Christina Davenport is a 51 y.o.female seen today as a new consult, referred by Dr Moshe Cipro for the following medical problems.    1.HTN -she reports component of white coat HTN -home bp's usually 130s/80s - recent normal renin/aldo ratio Upcoming sleep apnea evaluation - compliant with meds  2. Leg swelling/DOE - started about 3 months ago - wt up to 344 lbs, had been 335 lbs last month. Weight last year was 307 lbs - some abdominal firmness, some increased SOB/DOE - DOE walking to mailbox and back now causing SOB - no orthopnea - she is on lasix '20mg'$  daily.      3. CKD - last Ctr 1.76, GFR 35 - followed by Dr Theador Hawthorne  4. OSA eval - being evaluated by pulmonary  Past Medical History:  Diagnosis Date   Brain tumor (benign) ([place]) 2011   excision followed by radiation   CHF (congestive heart failure) ([place])    Phreesia 12/20/2019, patient denies today 08/08/20   Depression, major, single episode, moderate ([place]) 12/26/2019   PHQ 9 score of 12 in 11/2019   Diabetes mellitus type 2 in obese ([place]) 12/09/2007   Qualifier: Diagnosis of  By: Moshe Cipro MD, Margaret     Diabetes mellitus without complication ([place])    Phreesia 12/20/2019   Diabetes mellitus, type 2 ([place])    diet controlled   Endometrial thickening on ultra sound 01/08/2012   Reports had pelvic exam in 2015 will send for record    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   History of brain tumor 12/28/2015   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     amLODipine (NORVASC) 5 MG tablet Take one tablet by mouth every evening at 9 pm (Patient taking differently: Take 5 mg by mouth every evening.) 90 tablet 2   amLODipine-valsartan (EXFORGE) 10-320 MG tablet Take 1 tablet by mouth daily. (Patient taking differently: Take 1 tablet by mouth in the morning.) 90 tablet 2   atorvastatin (LIPITOR) 20 MG tablet TAKE 1 TABLET(20 MG) BY MOUTH DAILY 90 tablet 2   Cholecalciferol 25 MCG (1000 UT) tablet Take 1,000 Units by mouth daily.     ferrous gluconate (FERGON) 324 MG tablet Take 324 mg by mouth daily.     furosemide (LASIX) 20 MG tablet Take 1 tablet (20 mg total) by mouth daily. 30 tablet 1   hydroxypropyl methylcellulose / hypromellose (ISOPTO TEARS / GONIOVISC) 2.5 % ophthalmic solution Place 1 drop into both eyes 4 (four) times daily as needed for dry eyes.     meclizine (ANTIVERT) 25 MG tablet Take 1 tablet (25 mg total) by mouth 3 (three) times daily as needed for dizziness. 30 tablet 0   Multiple Vitamins-Iron (MULTIVITAMINS WITH IRON) TABS tablet Take 1 tablet by mouth daily.     ofloxacin (OCUFLOX) 0.3 % ophthalmic solution Place 1  drop into the left eye 4 (four) times daily. 5 mL 0   triamterene-hydrochlorothiazide (MAXZIDE) 75-50 MG tablet TAKE 1 TABLET BY MOUTH DAILY 90 tablet 2   White Petrolatum-Mineral Oil (EYE LUBRICANT) OINT Place 1 tablet into both eyes in the morning, at noon, and at bedtime.     No current facility-administered medications for this visit.     Past Surgical History:  Procedure Laterality Date   BRAIN SURGERY  11/12   COLONOSCOPY WITH PROPOFOL N/A 09/18/2020   Procedure: COLONOSCOPY WITH PROPOFOL;  Surgeon: Eloise Harman, DO;  [facility];  Service: Endoscopy;  Laterality: N/A;  8:30am   LAPAROTOMY Bilateral 07/07/2015   Procedure: EXPLORATORY LAPAROTOMY  LEFT OVARIAN CYSTECTOMY, LEFT PARATUBAL  CYSTECTOMY;  Surgeon: Servando Salina, MD;  Location: [facility];  Service: Gynecology;  Laterality: Bilateral;   repair of chiari malformation  05/02   SALPINGOOPHORECTOMY Right 07/07/2015   Procedure: RIGHT SALPINGO OOPHORECTOMY;  Surgeon: Servando Salina, MD;  Location: [facility];  Service: Gynecology;  Laterality: Right;   tumor removed  Brain, non cancerous   12/2009     Allergies  Allergen Reactions   Enalapril Maleate Hives, Itching and Swelling   Metformin Nausea And Vomiting    abd pain,    Other Itching      Family History  Problem Relation Age of Onset   Chiari malformation Mother        requiring surgery    Hypertension Father    Hypertension Sister    Cancer Maternal Grandmother        kidney   Heart disease Maternal Grandmother    Cancer Paternal Grandmother        stomach   Cancer Paternal Grandfather        lung   Colon polyps Neg Hx    Colon cancer Neg Hx      Social History Christina Davenport reports that she has never smoked. She has never used smokeless tobacco. Christina Davenport reports no history of alcohol use.   Review of Systems CONSTITUTIONAL: No weight loss, fever, chills, weakness or fatigue.  HEENT: Eyes: No visual loss, blurred vision, double vision or yellow sclerae.No hearing loss, sneezing, congestion, runny nose or sore throat.  SKIN: No rash or itching.  CARDIOVASCULAR: per hpi RESPIRATORY: per hpi GASTROINTESTINAL: No anorexia, nausea, vomiting or diarrhea. No abdominal pain or blood.  GENITOURINARY: No burning on urination, no polyuria NEUROLOGICAL: No headache, dizziness, syncope, paralysis, ataxia, numbness or tingling in the extremities. No change in bowel or bladder control.  MUSCULOSKELETAL: No muscle, back pain, joint pain or stiffness.  LYMPHATICS: No enlarged nodes. No history of splenectomy.  PSYCHIATRIC: No history of depression or anxiety.  ENDOCRINOLOGIC: No reports of sweating, cold or heat intolerance. No polyuria or  polydipsia.  Marland Kitchen   Physical Examination Today's Vitals   07/20/21 1031  BP: (!) 162/120  Pulse: 87  SpO2: 94%  Weight: (!) 344 lb 3.2 oz (156.1 kg)  Height: '5\' 5"'$  (1.651 m)   Body mass index is 57.28 kg/m.  Gen: resting comfortably, no acute distress HEENT: no scleral icterus, pupils equal round and reactive, no palptable cervical adenopathy,  CV: RRR, no m/r/g. Elevated JVD Resp: Clear to auscultation bilaterally GI: abdomen is soft, non-tender, non-distended, normal bowel sounds, no hepatosplenomegaly MSK: extremities are warm, 1-2+bilateral LE edema.  Skin: warm, no rash Neuro:  no focal deficits Psych: appropriate affect     Assessment and Plan  1.LE edema/DOE - significantly volume overloaded on  exam today. Weight is up 37 lbs since last year.  - increase lasix to '60mg'$  daily. Stop maxide, add aldactone 12.'5mg'$  daily. 2 weeks get bmet/mg/bnp - obtain echo, suspect likely hypertensive heart disease  -she will call and update [place] on weights on Tuesday. Titrate lasix accordingly.   2. HTN - elevated here but she reports home numbers are at goal - follow bp trend with diuresis - stopping maxide since on lasix, add aldactone 12.'5mg'$  daily - ongoing OSA eval, recent normal renin/aldo. Could consider renal artery [place] though body habitus likely limited, cannot get contrast for CTA given renal dysfunction. Monitor bp with diuresis for now     F/u 2 months   Arnoldo Lenis, M.D.

## 2021-07-25 NOTE — Progress Notes (Signed)
Reviewed and agree with assessment/plan.   Chesley Mires, MD [facility] Pulmonary/Critical Care 07/25/2021, 12:48 PM Pager:  [phone]

## 2021-07-27 ENCOUNTER — Ambulatory Visit: Payer: 59 | Admitting: Family Medicine

## 2021-07-27 ENCOUNTER — Ambulatory Visit (HOSPITAL_BASED_OUTPATIENT_CLINIC_OR_DEPARTMENT_OTHER)
Admission: RE | Admit: 2021-07-27 | Discharge: 2021-07-27 | Disposition: A | Discharge status: Home / Self Care | Payer: 59 | Source: Ambulatory Visit | Attending: Cardiology | Admitting: Cardiology

## 2021-07-27 DIAGNOSIS — R0602 Shortness of breath: Secondary | ICD-10-CM | POA: Diagnosis not present

## 2021-07-27 DIAGNOSIS — G9340 Encephalopathy, unspecified: Secondary | ICD-10-CM | POA: Diagnosis not present

## 2021-07-27 DIAGNOSIS — E860 Dehydration: Secondary | ICD-10-CM | POA: Diagnosis not present

## 2021-07-27 LAB — ECHOCARDIOGRAM COMPLETE
Area-P 1/2: 4.39 cm2
S' Lateral: 2.9 cm

## 2021-07-27 NOTE — Progress Notes (Signed)
*  PRELIMINARY RESULTS* Echocardiogram 2D Echocardiogram has been performed.  Christina Davenport 07/27/2021, 2:02 PM

## 2021-07-29 LAB — BRAIN NATRIURETIC PEPTIDE: BNP: 272.5 pg/mL — ABNORMAL HIGH (ref 0.0–100.0)

## 2021-07-29 LAB — BASIC METABOLIC PANEL
BUN/Creatinine Ratio: 27 — ABNORMAL HIGH (ref 9–23)
BUN: 71 mg/dL — ABNORMAL HIGH (ref 6–24)
CO2: 25 mmol/L (ref 20–29)
Calcium: 9.4 mg/dL (ref 8.7–10.2)
Chloride: 95 mmol/L — ABNORMAL LOW (ref 96–106)
Creatinine, Ser: 2.66 mg/dL — ABNORMAL HIGH (ref 0.57–1.00)
Glucose: 112 mg/dL — ABNORMAL HIGH (ref 70–99)
Potassium: 4.7 mmol/L (ref 3.5–5.2)
Sodium: 141 mmol/L (ref 134–144)
eGFR: 21 mL/min/{1.73_m2} — ABNORMAL LOW (ref >59)

## 2021-07-29 LAB — MAGNESIUM: Magnesium: 2 mg/dL (ref 1.6–2.3)

## 2021-07-30 ENCOUNTER — Other Ambulatory Visit: Payer: Self-pay

## 2021-07-30 ENCOUNTER — Emergency Department (HOSPITAL_COMMUNITY): Payer: 59

## 2021-07-30 ENCOUNTER — Encounter (HOSPITAL_COMMUNITY): Payer: Self-pay | Admitting: *Deleted

## 2021-07-30 ENCOUNTER — Inpatient Hospital Stay (HOSPITAL_COMMUNITY)
Admission: EM | Admit: 2021-07-30 | Discharge: 2021-08-05 | DRG: 640 | Disposition: A | Discharge status: Home / Self Care | Payer: 59 | Attending: Internal Medicine | Admitting: Internal Medicine

## 2021-07-30 DIAGNOSIS — N1832 Chronic kidney disease, stage 3b: Secondary | ICD-10-CM | POA: Diagnosis present

## 2021-07-30 DIAGNOSIS — G510 Bell's palsy: Secondary | ICD-10-CM

## 2021-07-30 DIAGNOSIS — G9340 Encephalopathy, unspecified: Secondary | ICD-10-CM | POA: Diagnosis present

## 2021-07-30 DIAGNOSIS — E785 Hyperlipidemia, unspecified: Secondary | ICD-10-CM | POA: Diagnosis present

## 2021-07-30 DIAGNOSIS — J9611 Chronic respiratory failure with hypoxia: Secondary | ICD-10-CM | POA: Diagnosis present

## 2021-07-30 DIAGNOSIS — E861 Hypovolemia: Secondary | ICD-10-CM | POA: Diagnosis present

## 2021-07-30 DIAGNOSIS — Z888 Allergy status to other drugs, medicaments and biological substances status: Secondary | ICD-10-CM

## 2021-07-30 DIAGNOSIS — Z90721 Acquired absence of ovaries, unilateral: Secondary | ICD-10-CM

## 2021-07-30 DIAGNOSIS — B999 Unspecified infectious disease: Secondary | ICD-10-CM | POA: Diagnosis present

## 2021-07-30 DIAGNOSIS — I2729 Other secondary pulmonary hypertension: Secondary | ICD-10-CM | POA: Diagnosis not present

## 2021-07-30 DIAGNOSIS — E1122 Type 2 diabetes mellitus with diabetic chronic kidney disease: Secondary | ICD-10-CM | POA: Diagnosis present

## 2021-07-30 DIAGNOSIS — R7401 Elevation of levels of liver transaminase levels: Secondary | ICD-10-CM | POA: Diagnosis present

## 2021-07-30 DIAGNOSIS — Z923 Personal history of irradiation: Secondary | ICD-10-CM

## 2021-07-30 DIAGNOSIS — I5032 Chronic diastolic (congestive) heart failure: Secondary | ICD-10-CM | POA: Diagnosis present

## 2021-07-30 DIAGNOSIS — G478 Other sleep disorders: Secondary | ICD-10-CM | POA: Diagnosis not present

## 2021-07-30 DIAGNOSIS — R791 Abnormal coagulation profile: Secondary | ICD-10-CM | POA: Diagnosis present

## 2021-07-30 DIAGNOSIS — G9341 Metabolic encephalopathy: Secondary | ICD-10-CM | POA: Diagnosis present

## 2021-07-30 DIAGNOSIS — I130 Hypertensive heart and chronic kidney disease with heart failure and stage 1 through stage 4 chronic kidney disease, or unspecified chronic kidney disease: Secondary | ICD-10-CM | POA: Diagnosis present

## 2021-07-30 DIAGNOSIS — N1831 Chronic kidney disease, stage 3a: Secondary | ICD-10-CM | POA: Diagnosis not present

## 2021-07-30 DIAGNOSIS — R278 Other lack of coordination: Secondary | ICD-10-CM | POA: Diagnosis present

## 2021-07-30 DIAGNOSIS — H1089 Other conjunctivitis: Secondary | ICD-10-CM | POA: Diagnosis present

## 2021-07-30 DIAGNOSIS — R4182 Altered mental status, unspecified: Secondary | ICD-10-CM | POA: Diagnosis not present

## 2021-07-30 DIAGNOSIS — R29810 Facial weakness: Secondary | ICD-10-CM | POA: Diagnosis present

## 2021-07-30 DIAGNOSIS — I2721 Secondary pulmonary arterial hypertension: Secondary | ICD-10-CM | POA: Diagnosis present

## 2021-07-30 DIAGNOSIS — N19 Unspecified kidney failure: Secondary | ICD-10-CM | POA: Diagnosis not present

## 2021-07-30 DIAGNOSIS — A084 Viral intestinal infection, unspecified: Secondary | ICD-10-CM | POA: Diagnosis present

## 2021-07-30 DIAGNOSIS — Z982 Presence of cerebrospinal fluid drainage device: Secondary | ICD-10-CM

## 2021-07-30 DIAGNOSIS — E1169 Type 2 diabetes mellitus with other specified complication: Secondary | ICD-10-CM | POA: Diagnosis present

## 2021-07-30 DIAGNOSIS — J9612 Chronic respiratory failure with hypercapnia: Secondary | ICD-10-CM | POA: Diagnosis present

## 2021-07-30 DIAGNOSIS — N179 Acute kidney failure, unspecified: Secondary | ICD-10-CM

## 2021-07-30 DIAGNOSIS — R441 Visual hallucinations: Secondary | ICD-10-CM | POA: Diagnosis present

## 2021-07-30 DIAGNOSIS — Z86011 Personal history of benign neoplasm of the brain: Secondary | ICD-10-CM

## 2021-07-30 DIAGNOSIS — Z6841 Body Mass Index (BMI) 40.0 and over, adult: Secondary | ICD-10-CM | POA: Diagnosis not present

## 2021-07-30 DIAGNOSIS — D72829 Elevated white blood cell count, unspecified: Secondary | ICD-10-CM | POA: Diagnosis present

## 2021-07-30 DIAGNOSIS — K8020 Calculus of gallbladder without cholecystitis without obstruction: Secondary | ICD-10-CM | POA: Diagnosis present

## 2021-07-30 DIAGNOSIS — N183 Chronic kidney disease, stage 3 unspecified: Secondary | ICD-10-CM | POA: Diagnosis present

## 2021-07-30 DIAGNOSIS — E282 Polycystic ovarian syndrome: Secondary | ICD-10-CM | POA: Diagnosis present

## 2021-07-30 DIAGNOSIS — E860 Dehydration: Principal | ICD-10-CM | POA: Diagnosis present

## 2021-07-30 DIAGNOSIS — Z8249 Family history of ischemic heart disease and other diseases of the circulatory system: Secondary | ICD-10-CM

## 2021-07-30 DIAGNOSIS — Z79899 Other long term (current) drug therapy: Secondary | ICD-10-CM

## 2021-07-30 DIAGNOSIS — Z8051 Family history of malignant neoplasm of kidney: Secondary | ICD-10-CM

## 2021-07-30 DIAGNOSIS — E662 Morbid (severe) obesity with alveolar hypoventilation: Secondary | ICD-10-CM | POA: Diagnosis present

## 2021-07-30 DIAGNOSIS — R55 Syncope and collapse: Secondary | ICD-10-CM | POA: Diagnosis present

## 2021-07-30 LAB — RAPID URINE DRUG SCREEN, HOSP PERFORMED
Amphetamines: NOT DETECTED
Barbiturates: NOT DETECTED
Benzodiazepines: NOT DETECTED
Cocaine: NOT DETECTED
Opiates: NOT DETECTED
Tetrahydrocannabinol: NOT DETECTED

## 2021-07-30 LAB — BLOOD GAS, VENOUS
Acid-Base Excess: 10.4 mmol/L — ABNORMAL HIGH (ref 0.0–2.0)
Bicarbonate: 38.2 mmol/L — ABNORMAL HIGH (ref 20.0–28.0)
Drawn by: 53361
FIO2: 21 %
O2 Saturation: 50 %
Patient temperature: 37
pCO2, Ven: 66 mmHg — ABNORMAL HIGH (ref 44–60)
pH, Ven: 7.37 (ref 7.25–7.43)
pO2, Ven: 32 mmHg (ref 32–45)

## 2021-07-30 LAB — URINALYSIS, ROUTINE W REFLEX MICROSCOPIC
Bilirubin Urine: NEGATIVE
Glucose, UA: NEGATIVE mg/dL
Hgb urine dipstick: NEGATIVE
Ketones, ur: NEGATIVE mg/dL
Leukocytes,Ua: NEGATIVE
Nitrite: NEGATIVE
Protein, ur: NEGATIVE mg/dL
Specific Gravity, Urine: 1.012 (ref 1.005–1.030)
pH: 6 (ref 5.0–8.0)

## 2021-07-30 LAB — CBC WITH DIFFERENTIAL/PLATELET
Abs Immature Granulocytes: 0.15 10*3/uL — ABNORMAL HIGH (ref 0.00–0.07)
Basophils Absolute: 0.1 10*3/uL (ref 0.0–0.1)
Basophils Relative: 1 %
Eosinophils Absolute: 0.1 10*3/uL (ref 0.0–0.5)
Eosinophils Relative: 1 %
HCT: 44.8 % (ref 36.0–46.0)
Hemoglobin: 12.2 g/dL (ref 12.0–15.0)
Immature Granulocytes: 1 %
Lymphocytes Relative: 10 %
Lymphs Abs: 1.1 10*3/uL (ref 0.7–4.0)
MCH: 21.1 pg — ABNORMAL LOW (ref 26.0–34.0)
MCHC: 27.2 g/dL — ABNORMAL LOW (ref 30.0–36.0)
MCV: 77.5 fL — ABNORMAL LOW (ref 80.0–100.0)
Monocytes Absolute: 1.4 10*3/uL — ABNORMAL HIGH (ref 0.1–1.0)
Monocytes Relative: 13 %
Neutro Abs: 8.1 10*3/uL — ABNORMAL HIGH (ref 1.7–7.7)
Neutrophils Relative %: 74 %
Platelets: 263 10*3/uL (ref 150–400)
RBC: 5.78 MIL/uL — ABNORMAL HIGH (ref 3.87–5.11)
RDW: 23.8 % — ABNORMAL HIGH (ref 11.5–15.5)
WBC: 10.9 10*3/uL — ABNORMAL HIGH (ref 4.0–10.5)
nRBC: 3.6 % — ABNORMAL HIGH (ref 0.0–0.2)

## 2021-07-30 LAB — COMPREHENSIVE METABOLIC PANEL
ALT: 698 U/L — ABNORMAL HIGH (ref 0–44)
AST: 607 U/L — ABNORMAL HIGH (ref 15–41)
Albumin: 3.7 g/dL (ref 3.5–5.0)
Alkaline Phosphatase: 112 U/L (ref 38–126)
Anion gap: 8 (ref 5–15)
BUN: 77 mg/dL — ABNORMAL HIGH (ref 6–20)
CO2: 32 mmol/L (ref 22–32)
Calcium: 8.8 mg/dL — ABNORMAL LOW (ref 8.9–10.3)
Chloride: 100 mmol/L (ref 98–111)
Creatinine, Ser: 2.07 mg/dL — ABNORMAL HIGH (ref 0.44–1.00)
GFR, Estimated: 29 mL/min — ABNORMAL LOW (ref >60)
Glucose, Bld: 119 mg/dL — ABNORMAL HIGH (ref 70–99)
Potassium: 4.1 mmol/L (ref 3.5–5.1)
Sodium: 140 mmol/L (ref 135–145)
Total Bilirubin: 3 mg/dL — ABNORMAL HIGH (ref 0.3–1.2)
Total Protein: 7.6 g/dL (ref 6.5–8.1)

## 2021-07-30 LAB — CBC
HCT: 46.8 % — ABNORMAL HIGH (ref 36.0–46.0)
Hemoglobin: 12.6 g/dL (ref 12.0–15.0)
MCH: 20.9 pg — ABNORMAL LOW (ref 26.0–34.0)
MCHC: 26.9 g/dL — ABNORMAL LOW (ref 30.0–36.0)
MCV: 77.7 fL — ABNORMAL LOW (ref 80.0–100.0)
Platelets: 279 10*3/uL (ref 150–400)
RBC: 6.02 MIL/uL — ABNORMAL HIGH (ref 3.87–5.11)
RDW: 23.8 % — ABNORMAL HIGH (ref 11.5–15.5)
WBC: 11.6 10*3/uL — ABNORMAL HIGH (ref 4.0–10.5)
nRBC: 3.9 % — ABNORMAL HIGH (ref 0.0–0.2)

## 2021-07-30 LAB — BRAIN NATRIURETIC PEPTIDE: B Natriuretic Peptide: 395 pg/mL — ABNORMAL HIGH (ref 0.0–100.0)

## 2021-07-30 LAB — AMMONIA: Ammonia: 17 umol/L (ref 9–35)

## 2021-07-30 LAB — PROTIME-INR
INR: 1.6 — ABNORMAL HIGH (ref 0.8–1.2)
Prothrombin Time: 19.1 seconds — ABNORMAL HIGH (ref 11.4–15.2)

## 2021-07-30 LAB — CBG MONITORING, ED: Glucose-Capillary: 115 mg/dL — ABNORMAL HIGH (ref 70–99)

## 2021-07-30 LAB — CREATININE, SERUM
Creatinine, Ser: 1.92 mg/dL — ABNORMAL HIGH (ref 0.44–1.00)
GFR, Estimated: 32 mL/min — ABNORMAL LOW (ref >60)

## 2021-07-30 LAB — GLUCOSE, CAPILLARY: Glucose-Capillary: 117 mg/dL — ABNORMAL HIGH (ref 70–99)

## 2021-07-30 LAB — LACTIC ACID, PLASMA
Lactic Acid, Venous: 1.3 mmol/L (ref 0.5–1.9)
Lactic Acid, Venous: 1 mmol/L (ref 0.5–1.9)

## 2021-07-30 LAB — ETHANOL: Alcohol, Ethyl (B): <10 mg/dL (ref <10)

## 2021-07-30 MED ORDER — ONDANSETRON HCL 4 MG/2ML IJ SOLN: 4.0000 mg | Freq: Four times a day (QID) | INTRAMUSCULAR | Status: DC | PRN

## 2021-07-30 MED ORDER — ALLOPURINOL 100 MG PO TABS
100.0000 mg | ORAL_TABLET | Freq: Every day | ORAL | Status: DC
  Administered 2021-07-31 – 2021-08-05 (×6): 100 mg via ORAL
  Filled 2021-07-30 (×6): qty 1

## 2021-07-30 MED ORDER — ALBUTEROL SULFATE (2.5 MG/3ML) 0.083% IN NEBU: 2.5000 mg | INHALATION_SOLUTION | RESPIRATORY_TRACT | Status: DC | PRN

## 2021-07-30 MED ORDER — ONDANSETRON HCL 4 MG PO TABS: 4.0000 mg | ORAL_TABLET | Freq: Four times a day (QID) | ORAL | Status: DC | PRN

## 2021-07-30 MED ORDER — SODIUM CHLORIDE 0.9 % IV SOLN: INTRAVENOUS | Status: DC

## 2021-07-30 MED ORDER — FENTANYL CITRATE PF 50 MCG/ML IJ SOSY
50.0000 ug | PREFILLED_SYRINGE | Freq: Once | INTRAMUSCULAR | Status: AC
  Administered 2021-07-30: 50 ug via INTRAVENOUS
  Filled 2021-07-30: qty 1

## 2021-07-30 MED ORDER — POLYETHYLENE GLYCOL 3350 17 G PO PACK: 17.0000 g | PACK | Freq: Every day | ORAL | Status: DC | PRN

## 2021-07-30 MED ORDER — FERROUS SULFATE 325 (65 FE) MG PO TABS
325.0000 mg | ORAL_TABLET | Freq: Every day | ORAL | Status: DC
  Administered 2021-07-30 – 2021-08-05 (×7): 325 mg via ORAL
  Filled 2021-07-30 (×7): qty 1

## 2021-07-30 MED ORDER — HEPARIN SODIUM (PORCINE) 5000 UNIT/ML IJ SOLN
5000.0000 [IU] | Freq: Three times a day (TID) | INTRAMUSCULAR | Status: DC
  Administered 2021-07-30 – 2021-08-05 (×17): 5000 [IU] via SUBCUTANEOUS
  Filled 2021-07-30 (×17): qty 1

## 2021-07-30 MED ORDER — INSULIN ASPART 100 UNIT/ML IJ SOLN: 0.0000 [IU] | Freq: Three times a day (TID) | INTRAMUSCULAR | Status: DC

## 2021-07-30 MED ORDER — HYDRALAZINE HCL 20 MG/ML IJ SOLN
10.0000 mg | Freq: Four times a day (QID) | INTRAMUSCULAR | Status: DC | PRN
  Administered 2021-08-04: 10 mg via INTRAVENOUS
  Filled 2021-07-30: qty 1

## 2021-07-30 NOTE — ED Triage Notes (Signed)
Reported that pt lives alone, mother visited pt and noted pt still in bed.  Pt works from home and could not remember to how to log in for work or to use her iphone. Mother reports that pt with visual hallucinations- seen a man in her room but was not there.  Mother states pt is sleeping more than usual for the past week. Denies any ETOH or drug use. Mother states pt is retaining fluid lately, seeing a cardiologist and kidney MD.

## 2021-07-30 NOTE — H&P (Addendum)
History and Physical    Patient: Christina Davenport CVE:[id number] DOB: Jun 05, 1970 DOA: 07/30/2021 DOS: the patient was seen and examined on 07/30/2021 PCP: Fayrene Helper, MD  Patient coming from: Home  Chief Complaint:  Chief Complaint  Patient presents with   Altered Mental Status   HPI: Christina Davenport is a 51 y.o. 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-who was found by mother confused-and subsequently brought to the hospital for further evaluation and treatment.  Per patient's mother-patient has had intermittent confusion for the past several weeks-however today when the mother went to the patient's house-she had persistent confusion-and had some visual hallucinations (seeing a man in the house).  Per patient's mother-patient works for [employer] had not logged in for work (she normally [place] at [employer])-and was having trouble accessing her phone.  She was brought to the ED where she was found to have AKI and transaminitis-and subsequently [facility] was consulted for admission.  Per patient-she is on multiple diuretics and antihypertensive medications-she was recently started on Aldactone-however she has not had a chance to fill the prescription yet.  She has no history of vomiting or diarrhea although she has had a very poor appetite for the past several days.  She does acknowledge getting lightheaded/dizzy upon standing for the past several days.  During my evaluation-patient was completely awake and alert.  She denied any headache, chest pain, nausea, vomiting, diarrhea, abdominal pain.   Review of Systems: As mentioned in the history of present illness. All other systems reviewed and are negative. Past Medical History:  Diagnosis Date   Brain tumor (benign) ([place]) 2011   excision followed by radiation   CHF (congestive heart failure) ([place])     Phreesia 12/20/2019, patient denies today 08/08/20   Depression, major, single episode, moderate ([place]) 12/26/2019   PHQ 9 score of 12 in 11/2019   Diabetes mellitus type 2 in obese ([place]) 12/09/2007   Qualifier: Diagnosis of  By: Moshe Cipro MD, Margaret     Diabetes mellitus without complication ([place])    Phreesia 12/20/2019   Diabetes mellitus, type 2 ([place])    diet controlled   Endometrial thickening on ultra sound 01/08/2012   Reports had pelvic exam in 2015 will send for record    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   History of brain tumor 12/28/2015   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       Hypertension    Obesity    Ovarian cyst 01/08/2012   PCO (polycystic ovaries)    S/P right oophorectomy 07/07/2015   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   Past Surgical History:  Procedure Laterality Date   BRAIN SURGERY  11/12   COLONOSCOPY WITH PROPOFOL N/A 09/18/2020   Procedure: COLONOSCOPY WITH PROPOFOL;  Surgeon: Eloise Harman, DO;  [facility];  Service: Endoscopy;  Laterality: N/A;  8:30am   LAPAROTOMY Bilateral 07/07/2015   Procedure: EXPLORATORY LAPAROTOMY  LEFT OVARIAN CYSTECTOMY, LEFT PARATUBAL CYSTECTOMY;  Surgeon: Servando Salina, MD;  Location: [facility];  Service: Gynecology;  Laterality: Bilateral;   repair of chiari malformation  05/02   SALPINGOOPHORECTOMY Right 07/07/2015   Procedure: RIGHT SALPINGO OOPHORECTOMY;  Surgeon: Servando Salina, MD;  Location: [facility];  Service: Gynecology;  Laterality: Right;   tumor removed  Brain, non cancerous   12/2009   Social History:  reports that she has never smoked. She has never used smokeless tobacco. She reports that she does not drink alcohol and does not use drugs.  Allergies  Allergen Reactions   Enalapril Maleate Hives, Itching and Swelling   Metformin Nausea And Vomiting    abd pain,    Other Itching    Family History  Problem  Relation Age of Onset   Chiari malformation Mother        requiring surgery    Hypertension Father    Hypertension Sister    Cancer Maternal Grandmother        kidney   Heart disease Maternal Grandmother    Cancer Paternal Grandmother        stomach   Cancer Paternal Grandfather        lung   Colon polyps Neg Hx    Colon cancer Neg Hx     Prior to Admission medications   Medication Sig Start Date End Date Taking? Authorizing Provider  acetaminophen (TYLENOL) 500 MG tablet Take 500 mg by mouth every 6 (six) hours as needed for mild pain.   Yes [provider]  allopurinol (ZYLOPRIM) 100 MG tablet Take 100 mg by mouth daily. 07/26/21  Yes [provider]  amLODipine-valsartan (EXFORGE) 10-320 MG tablet Take 1 tablet by mouth daily. Patient taking differently: Take 1 tablet by mouth in the morning. 08/16/20  Yes Fayrene Helper, MD  atorvastatin (LIPITOR) 20 MG tablet TAKE 1 TABLET(20 MG) BY MOUTH DAILY Patient taking differently: Take 20 mg by mouth daily. 01/08/21  Yes Fayrene Helper, MD  carvedilol (COREG) 6.25 MG tablet Take 6.25 mg by mouth 2 (two) times daily. 07/18/21  Yes [provider]  ferrous sulfate 325 (65 FE) MG EC tablet Take 1 tablet by mouth daily. 07/26/21  Yes [provider]  furosemide (LASIX) 20 MG tablet Take 3 tablets (60 mg total) by mouth daily. 07/20/21  Yes Branch, Alphonse Guild, MD  meclizine (ANTIVERT) 25 MG tablet Take 1 tablet (25 mg total) by mouth 3 (three) times daily as needed for dizziness. 04/26/21  Yes Lindell Spar, MD  spironolactone (ALDACTONE) 25 MG tablet Take 0.5 tablets (12.5 mg total) by mouth daily. 07/20/21  Yes Branch, Alphonse Guild, MD  White Petrolatum-Mineral Oil (EYE LUBRICANT) OINT Place 1 tablet into both eyes in the morning, at noon, and at bedtime.   Yes [provider]  Cholecalciferol 25 MCG (1000 UT) tablet Take 1,000 Units by mouth daily.    [provider]    Physical  Exam: Vitals:   07/30/21 1600 07/30/21 1630 07/30/21 1802 07/30/21 1806  BP: (!) 147/102 (!) 126/55  122/72  Pulse: 94 88  93  Resp: (!) '21 13  19  '$ Temp:   98.7 F (37.1 C)   TempSrc:   Oral   SpO2: 96% 96%  92%  Weight:      Height:       Gen Exam:Alert awake-not in any distress HEENT:atraumatic, normocephalic Chest: B/L clear to auscultation anteriorly CVS:S1S2 regular Abdomen:soft non tender, non distended Extremities:+ edema Neurology: Left facial weakness/droop but otherwise unremarkable. Skin: no rash   Data Reviewed:    Latest Ref Rng & Units 07/30/2021    1:00 PM 09/12/2020   11:59 AM 12/22/2019   10:22 AM  CBC  WBC 4.0 - 10.5 K/uL 10.9   6.9   7.7    Hemoglobin 12.0 - 15.0 g/dL 12.2   14.5   12.6  Hematocrit 36.0 - 46.0 % 44.8   43.0   38.7    Platelets 150 - 400 K/uL 263   359   326         Latest Ref Rng & Units 07/30/2021    1:00 PM 07/27/2021    2:07 PM 06/13/2021    8:18 AM  CMP  Glucose 70 - 99 mg/dL 119   112   100    BUN 6 - 20 mg/dL 77   71   31    Creatinine 0.44 - 1.00 mg/dL 2.07   2.66   1.51    Sodium 135 - 145 mmol/L 140   141   141    Potassium 3.5 - 5.1 mmol/L 4.1   4.7   4.8    Chloride 98 - 111 mmol/L 100   95   91    CO2 22 - 32 mmol/L 32   25   26    Calcium 8.9 - 10.3 mg/dL 8.8   9.4   9.1    Total Protein 6.5 - 8.1 g/dL 7.6    7.7    Total Bilirubin 0.3 - 1.2 mg/dL 3.0    0.7    Alkaline Phos 38 - 126 U/L 112    63    AST 15 - 41 U/L 607    34    ALT 0 - 44 U/L 698    36       Assessment and Plan: AKI on CKD stage IIIb: Suspect this is hemodynamically mediated-patient is on numerous antihypertensives/diuretics-given ongoing hepatitis (?  Shock liver)-some suspicion that she may have had a hypotensive episode at home.  UA negative for proteinuria-check renal ultrasound-we will hydrate cautiously overnight.  Hold all diuretics/antihypertensives.  Transaminitis: Suspect transient hypotension episode at home (she does acknowledge poor  appetite-and dizzy spells when she stands up)-RUQ ultrasound without any significant abnormalities.  Check hepatitis serology-hold statins-hydrate-follow liver enzymes-if no improvement-consider GI evaluation and further work-up.  She does have some mildly elevated INR-we will plan to repeat coags tomorrow morning as well.  Acute metabolic encephalopathy: Appears to be mild-probably due to AKI/transaminitis-during my evaluation she was completely awake and alert.  CT head without any acute changes.  Follow closely-hopefully as her metabolic derangements improve-her encephalopathy will completely resolved.  If not-further work-up could be contemplated.  Chronic HFpEF: Does have some lower extremity edema at baseline-but otherwise compensated.  See above regarding plans to hold diuretics/antihypertensives-follow closely.  HTN: Hold all antihypertensives.  DM-2 (A1c 6.6 on 4/19): Does not appear to be on any diabetic medications-probably diet controlled at home-placing on SSI and monitoring closely.  Morbid obesity: We will need ongoing counseling regarding importance of weight loss.  Probable sleep apnea: Pulmonology following in the outpatient setting with plans for sleep study.  History of left-sided glomus tumor of the posterior fossa-s/p craniotomy-with VP shunt-s/p left facial paralysis   Advance Care Planning:Full Code  Consults: None  Family Communication: Mother at bedside  Severity of Illness: The appropriate patient status for this patient is INPATIENT. Inpatient status is judged to be reasonable and necessary in order to provide the required intensity of service to ensure the patient's safety. The patient's presenting symptoms, physical exam findings, and initial radiographic and laboratory data in the context of their chronic comorbidities is felt to place them at high risk for further clinical deterioration. Furthermore, it is not anticipated that the patient will be medically  stable for discharge from the hospital within 2  midnights of admission.   * I certify that at the point of admission it is my clinical judgment that the patient will require inpatient hospital care spanning beyond 2 midnights from the point of admission due to high intensity of service, high risk for further deterioration and high frequency of surveillance required.*  Author: Oren Binet, MD 07/30/2021 6:20 PM  For on call review [email or web address].

## 2021-07-30 NOTE — ED Provider Notes (Signed)
[facility] EMERGENCY DEPARTMENT Provider Note   CSN: [id number] Arrival date & time: 07/30/21  1144     History  Chief Complaint  Patient presents with   Altered Mental Status    Christina Davenport is a 51 y.o. female.  HPI  Medical history including obesity, brain tumor, diabetes type 2, hypertension, CKD stage III presents with complaints of altered mental status.  Patient states that she is here because she has been more forgetful.  She states that she knows that she has had a productive cough for last 2 weeks,  denies any fevers or chills chest pain shortness of breath stomach pains nausea vomiting diarrhea.  States that she has had decreased in appetite everything tastes too sweet,  denies  worsening leg swelling orthopnea states she been compliant with all home medications, denies any illicit drug use.  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  She states that patient is not endorsing any fevers chills cough congestion URI-like symptoms states that she no history of illicit drug use.  No recent falls.  Not on anticoag's.  Home Medications Prior to Admission medications   Medication Sig Start Date End Date Taking? Authorizing Provider  acetaminophen (TYLENOL) 500 MG tablet Take 500 mg by mouth every 6 (six) hours as needed for mild pain.   Yes [provider]  allopurinol (ZYLOPRIM) 100 MG tablet Take 100 mg by mouth daily. 07/26/21  Yes [provider]  amLODipine-valsartan (EXFORGE) 10-320 MG tablet Take 1 tablet by mouth daily. Patient taking differently: Take 1 tablet by mouth in the morning. 08/16/20  Yes Fayrene Helper, MD   atorvastatin (LIPITOR) 20 MG tablet TAKE 1 TABLET(20 MG) BY MOUTH DAILY Patient taking differently: Take 20 mg by mouth daily. 01/08/21  Yes Fayrene Helper, MD  carvedilol (COREG) 6.25 MG tablet Take 6.25 mg by mouth 2 (two) times daily. 07/18/21  Yes [provider]  ferrous sulfate 325 (65 FE) MG EC tablet Take 1 tablet by mouth daily. 07/26/21  Yes [provider]  furosemide (LASIX) 20 MG tablet Take 3 tablets (60 mg total) by mouth daily. 07/20/21  Yes Branch, Alphonse Guild, MD  meclizine (ANTIVERT) 25 MG tablet Take 1 tablet (25 mg total) by mouth 3 (three) times daily as needed for dizziness. 04/26/21  Yes Lindell Spar, MD  spironolactone (ALDACTONE) 25 MG tablet Take 0.5 tablets (12.5 mg total) by mouth daily. 07/20/21  Yes Branch, Alphonse Guild, MD  White Petrolatum-Mineral Oil (EYE LUBRICANT) OINT Place 1 tablet into both eyes in the morning, at noon, and at bedtime.   Yes [provider]  Cholecalciferol 25 MCG (1000 UT) tablet Take 1,000 Units by mouth daily.    [provider]      Allergies    Enalapril maleate, Metformin, and Other    Review of Systems   Review of Systems  Constitutional:  Negative for chills and fever.  Respiratory:  Negative for shortness of breath.   Cardiovascular:  Negative for chest pain.  Gastrointestinal:  Negative for abdominal pain.  Neurological:  Negative for headaches.  Psychiatric/Behavioral:  Positive for confusion.  Physical Exam Updated Vital Signs BP 122/72   Pulse 93   Temp 98.7 F (37.1 C) (Oral)   Resp 19   Ht '5\' 5"'$  (1.651 m)   Wt (!) 151.6 kg   LMP 07/18/2021   SpO2 92%   BMI 55.63 kg/m  Physical Exam Vitals and nursing note reviewed.  Constitutional:      General: She is not in acute distress.    Appearance: She is not ill-appearing.  HENT:     Head: Normocephalic and atraumatic.     Comments: No deformity the head present no raccoon eyes or battle sign noted.    Nose: No congestion.      Mouth/Throat:     Comments: No trismus no torticollis no oral trauma present. Eyes:     Conjunctiva/sclera: Conjunctivae normal.  Cardiovascular:     Rate and Rhythm: Normal rate and regular rhythm.     Pulses: Normal pulses.     Heart sounds: No murmur heard.   No friction rub. No gallop.  Pulmonary:     Effort: No respiratory distress.     Breath sounds: Rales present. No wheezing or rhonchi.     Comments: Patient is on 2 L via nasal cannula, she is nontachypneic able speak in full sentences, she has noted decreased lung sounds lower lobes with bilateral rails worse on the right versus the left, no wheezing or rhonchi present. Abdominal:     Palpations: Abdomen is soft.     Tenderness: There is no abdominal tenderness. There is no right CVA tenderness or left CVA tenderness.  Musculoskeletal:     Right lower leg: No edema.     Left lower leg: Edema present.     Comments: Spine was palpated was nontender to palpation no step-off deformities noted.  Patient 2+ pitting edema up to the shins bilaterally without skin changes.  2+ dorsal pedal pulses  Skin:    General: Skin is warm and dry.     Comments: Skin exam was performed there is no evidence of cellulitis ulcers or abscesses  Neurological:     Mental Status: She is alert.     Sensory: Sensation is intact.     Motor: No weakness.     Coordination: Romberg sign negative. Finger-Nose-Finger Test normal.     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    ED Results / Procedures / Treatments   Labs (all labs ordered are listed, but only abnormal results are displayed) Labs Reviewed  COMPREHENSIVE METABOLIC  PANEL - Abnormal; Notable for the following components:      Result Value   Glucose, Bld 119 (*)    BUN 77 (*)    Creatinine, Ser 2.07 (*)    Calcium 8.8 (*)    AST 607 (*)    ALT 698 (*)    Total Bilirubin 3.0 (*)    GFR, Estimated 29 (*)    All other components within normal limits  CBC WITH DIFFERENTIAL/PLATELET - Abnormal; Notable for the following components:   WBC 10.9 (*)    RBC 5.78 (*)    MCV 77.5 (*)    MCH 21.1 (*)  MCHC 27.2 (*)    RDW 23.8 (*)    nRBC 3.6 (*)    Neutro Abs 8.1 (*)    Monocytes Absolute 1.4 (*)    Abs Immature Granulocytes 0.15 (*)    All other components within normal limits  BLOOD GAS, VENOUS - Abnormal; Notable for the following components:   pCO2, Ven 66 (*)    Bicarbonate 38.2 (*)    Acid-Base Excess 10.4 (*)    All other components within normal limits  BRAIN NATRIURETIC PEPTIDE - Abnormal; Notable for the following components:   B Natriuretic Peptide 395.0 (*)    All other components within normal limits  PROTIME-INR - Abnormal; Notable for the following components:   Prothrombin Time 19.1 (*)    INR 1.6 (*)    All other components within normal limits  CBG MONITORING, ED - Abnormal; Notable for the following components:   Glucose-Capillary 115 (*)    All other components within normal limits  LACTIC ACID, PLASMA  LACTIC ACID, PLASMA  ETHANOL  URINALYSIS, ROUTINE W REFLEX MICROSCOPIC  RAPID URINE DRUG SCREEN, HOSP PERFORMED  AMMONIA  HEPATITIS PANEL, ACUTE    EKG None  Radiology CT Head Wo Contrast  Result Date: 07/30/2021 CLINICAL DATA:  Mental status changes, unknown cause. EXAM: CT HEAD WITHOUT CONTRAST TECHNIQUE: Contiguous axial images were obtained from the base of the skull through the vertex without intravenous contrast. RADIATION DOSE REDUCTION: This exam was performed according to the departmental dose-optimization program which includes automated exposure control, adjustment of the mA and/or kV according to patient  size and/or use of iterative reconstruction technique. COMPARISON:  None Available. FINDINGS: Brain: Right frontal ventriculostomy catheter terminates in the ventricles. Ventricle size is normal. Midline suboccipital craniotomy noted. Foramen magnum is decompressed. 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 No acute infarct, hemorrhage, or mass lesion is present. Ventricles are of normal size. No significant extra-axial fluid collection is present. Vascular: No hyperdense vessel or unexpected calcification. Skull: Metallic fragment noted in the posterior left neck, likely corresponding to remote trauma of the left occipital skull and mastoid. Metallic density present along the lateral floor of the middle cranial fossa. Sinuses/Orbits: The paranasal sinuses and mastoid air cells are clear. The globes and orbits are within normal limits. IMPRESSION: 1. Right frontal ventriculostomy catheter terminates in the ventricles. Ventricle size is normal. 2. 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 3. Metallic fragment in the posterior left neck, likely corresponding to remote trauma of the left occipital skull and mastoid. 4. Metallic density along the lateral floor of the middle cranial fossa. Electronically Signed   By: San Morelle M.D.   On: 07/30/2021 15:09   US Abdomen Limited  Result Date: 07/30/2021 CLINICAL DATA:  Abnormal LFTs EXAM: ULTRASOUND ABDOMEN LIMITED RIGHT UPPER QUADRANT COMPARISON:  None Available. FINDINGS: Gallbladder: Filled with echogenic shadowing calculi. Appears to be wall thickening with no significant pericholecystic fluid. Negative sonographic Murphy sign. Common bile duct: Diameter: 3 mm Liver: No focal lesion identified. Within normal limits in parenchymal echogenicity. Portal vein is patent on color Doppler imaging with normal direction of blood flow towards the  liver. Other: None. IMPRESSION: Cholelithiasis. Electronically Signed   By: Ofilia Neas M.D.   On: 07/30/2021 16:21   DG Chest Portable 1 View  Result Date: 07/30/2021 CLINICAL DATA:  Shortness of breath.  Mental status changes. EXAM: PORTABLE CHEST 1 VIEW COMPARISON:  02/25/2009 FINDINGS: Mildly degraded exam due  to AP portable technique and patient body habitus. VP shunt catheter projects over the right chest. Apical lordotic positioning. Midline trachea. Cardiomegaly accentuated by AP portable technique. No right and no definite left pleural effusion. Breast tissue projects over the left lung base. No pneumothorax. Interstitial prominence is accentuated by low lung volumes. No well-defined lobar consolidation. IMPRESSION: Decreased sensitivity and specificity exam due to technique related factors, as described above. Cardiomegaly and low lung volumes. Pulmonary interstitial thickening, accentuated by low lung volumes. Considerations include mild pulmonary venous congestion or the sequelae of asthma/chronic bronchitis (patient is a never smoker). Electronically Signed   By: Abigail Miyamoto M.D.   On: 07/30/2021 13:22    Procedures Procedures    Medications Ordered in ED Medications  fentaNYL (SUBLIMAZE) injection 50 mcg (50 mcg Intravenous Given 07/30/21 1426)    ED Course/ Medical Decision Making/ A&P                           Medical Decision Making Amount and/or Complexity of Data Reviewed Labs: ordered. Radiology: ordered.  Risk Prescription drug management. Decision regarding hospitalization.   This patient presents to the ED for concern of altered mental status, this involves an extensive number of treatment options, and is a complaint that carries with it a high risk of complications and morbidity.  The differential diagnosis includes uremia, hypoxia, sepsis, metabolic derailments    Additional history obtained:  Additional history obtained from mother at bedside External  records from outside source obtained and reviewed including nephrology notes   Co morbidities that complicate the patient evaluation  Obesity, CKD  Social Determinants of Health:    Lab Tests:  I Ordered, and personally interpreted labs.  The pertinent results include: VBG shows PCO2 of 66, bicarb 30.2, rapid urine drug screen negative, UA was negative glucose 115, CBC shows slight leukocytosis of 10.9, CMP shows BUN of 77, creatinine 2.07, calcium 8.8, AST 607 ALT 698 T. bili 3.0, BNP is 395, ammonia 17, prothrombin time 19.1 INR 1.6   Imaging Studies ordered:  I ordered imaging studies including chest x-ray, CT head I independently visualized and interpreted imaging which showed c chest x-ray inconclusive I agree with the radiologist interpretation   Cardiac Monitoring:  The patient was maintained on a cardiac monitor.  I personally viewed and interpreted the cardiac monitored which showed an underlying rhythm of:    Medicines ordered and prescription drug management:  I ordered medication including fentanyl for headache I have reviewed the patients home medicines and have made adjustments as needed  Critical Interventions:  N/A   Reevaluation:  Patient presents slightly altered, unclear etiology, due to her history of CKD I am concerned for possible uremia versus CHF exacerbation will obtain lab work, chest x-ray, CT head and reassess.  Lab work reveals elevated BUN 77,2.07, elevated liver enzymes, reassessed the patient she has no abdominal tenderness, she is currently on room air with normal oxygen saturation, will obtain limited ultrasound for further evaluation of the elevated liver enzymes.  Ultrasound is unremarkable, will consult GI for further recommendations.  Updated patient on recommendations from GI and they are in agreement this plan will admit to medicine.    Consultations Obtained:  I requested consultation with the gastroenterology Dr. Abbey Chatters,   and discussed lab and imaging findings as well as pertinent plan - they recommend: Recommend obtaining INR, admit to medicine and he will evaluate the patient tomorrow. Spoke with Dr. Evalee Mutton who will  admit the patient    Test Considered:  CT abdomen pelvis-defer as my suspicion for intra-abdominal abnormality is low at this time, abdomen soft nontender, she is afebrile, nontachycardic, only slight elevation in white count.     Rule out low suspicion for internal head bleed and or mass as CT imaging is negative for acute findings.  Low suspicion for CVA she has no focal deficit present my exam.  Low suspicion for dissection of the vertebral or carotid artery as presentation atypical of etiology.  Low suspicion for meningitis as she has no meningeal sign present.  I have low suspicion for cholecystitis as she has no right upper quadrant tenderness, ultrasound is negative these findings.  I have low suspicion for bowel obstructions abdomen soft nontender still passing gas having normal bowel movements.  I have low suspicion for emergent dialysis as she has no new oxygen requirements, there is drome and electrolytes.  I have low suspicion for pneumonia patient  does not have a productive cough, she is nontoxic-appearing, she does have rales on my exam this is likely secondary to his slight edema seen on chest x-ray from AKI.     Dispostion and problem list  After consideration of the diagnostic results and the patients response to treatment, I feel that the patent would benefit from admission.  Altered mental status-likely secondary due to uremic from worsening kidney functions, unclear etiology but possibly due to worse cardiac function likely would benefit from echocardiogram for further eval.    Elevated liver enzymes-unclear etiology, and possibly from sudden drop in blood pressure from worsening cardiac function, no history of alcohol use or IV drug use, hepatic panel pending, will need former  consultation from GI for further recommendations.              Final Clinical Impression(s) / ED Diagnoses Final diagnoses:  Uremia  Altered mental status, unspecified altered mental status type  AKI (acute kidney injury) [facility])    Rx / DC Orders ED Discharge Orders     None         Marcello Fennel, PA-C 07/30/21 1831    Godfrey Pick, MD 07/31/21 [id number]

## 2021-07-31 ENCOUNTER — Inpatient Hospital Stay (HOSPITAL_COMMUNITY): Payer: 59

## 2021-07-31 DIAGNOSIS — G9340 Encephalopathy, unspecified: Secondary | ICD-10-CM | POA: Diagnosis not present

## 2021-07-31 DIAGNOSIS — R4182 Altered mental status, unspecified: Secondary | ICD-10-CM

## 2021-07-31 LAB — CBC
HCT: 43.2 % (ref 36.0–46.0)
Hemoglobin: 11.4 g/dL — ABNORMAL LOW (ref 12.0–15.0)
MCH: 20.9 pg — ABNORMAL LOW (ref 26.0–34.0)
MCHC: 26.4 g/dL — ABNORMAL LOW (ref 30.0–36.0)
MCV: 79.1 fL — ABNORMAL LOW (ref 80.0–100.0)
Platelets: 264 10*3/uL (ref 150–400)
RBC: 5.46 MIL/uL — ABNORMAL HIGH (ref 3.87–5.11)
RDW: 23.5 % — ABNORMAL HIGH (ref 11.5–15.5)
WBC: 12.6 10*3/uL — ABNORMAL HIGH (ref 4.0–10.5)
nRBC: 2.1 % — ABNORMAL HIGH (ref 0.0–0.2)

## 2021-07-31 LAB — COMPREHENSIVE METABOLIC PANEL
ALT: 606 U/L — ABNORMAL HIGH (ref 0–44)
AST: 417 U/L — ABNORMAL HIGH (ref 15–41)
Albumin: 3.2 g/dL — ABNORMAL LOW (ref 3.5–5.0)
Alkaline Phosphatase: 94 U/L (ref 38–126)
Anion gap: 10 (ref 5–15)
BUN: 61 mg/dL — ABNORMAL HIGH (ref 6–20)
CO2: 33 mmol/L — ABNORMAL HIGH (ref 22–32)
Calcium: 9 mg/dL (ref 8.9–10.3)
Chloride: 103 mmol/L (ref 98–111)
Creatinine, Ser: 1.61 mg/dL — ABNORMAL HIGH (ref 0.44–1.00)
GFR, Estimated: 39 mL/min — ABNORMAL LOW (ref >60)
Glucose, Bld: 96 mg/dL (ref 70–99)
Potassium: 4.3 mmol/L (ref 3.5–5.1)
Sodium: 146 mmol/L — ABNORMAL HIGH (ref 135–145)
Total Bilirubin: 2.2 mg/dL — ABNORMAL HIGH (ref 0.3–1.2)
Total Protein: 6.9 g/dL (ref 6.5–8.1)

## 2021-07-31 LAB — GLUCOSE, CAPILLARY
Glucose-Capillary: 88 mg/dL (ref 70–99)
Glucose-Capillary: 113 mg/dL — ABNORMAL HIGH (ref 70–99)
Glucose-Capillary: 94 mg/dL (ref 70–99)
Glucose-Capillary: 121 mg/dL — ABNORMAL HIGH (ref 70–99)

## 2021-07-31 LAB — PROTIME-INR
INR: 1.5 — ABNORMAL HIGH (ref 0.8–1.2)
INR: 1.4 — ABNORMAL HIGH (ref 0.8–1.2)
Prothrombin Time: 18 seconds — ABNORMAL HIGH (ref 11.4–15.2)
Prothrombin Time: 17.4 seconds — ABNORMAL HIGH (ref 11.4–15.2)

## 2021-07-31 LAB — C DIFFICILE QUICK SCREEN W PCR REFLEX
C Diff antigen: NEGATIVE
C Diff interpretation: NOT DETECTED
C Diff toxin: NEGATIVE

## 2021-07-31 LAB — TSH: TSH: 1.328 u[IU]/mL (ref 0.350–4.500)

## 2021-07-31 LAB — CK: Total CK: 97 U/L (ref 38–234)

## 2021-07-31 MED ORDER — TOBRAMYCIN 0.3 % OP SOLN
2.0000 [drp] | Freq: Four times a day (QID) | OPHTHALMIC | Status: DC
  Administered 2021-07-31 – 2021-08-05 (×19): 2 [drp] via OPHTHALMIC
  Filled 2021-07-31: qty 5

## 2021-07-31 MED ORDER — TRAMADOL HCL 50 MG PO TABS
50.0000 mg | ORAL_TABLET | Freq: Once | ORAL | Status: AC
  Administered 2021-07-31: 50 mg via ORAL
  Filled 2021-07-31: qty 1

## 2021-07-31 NOTE — Progress Notes (Addendum)
PROGRESS NOTE     Christina Davenport, is a 51 y.o. female, DOB - October 28, 1970, EXB:[id number]  Admit date - 07/30/2021   Admitting Physician Christina Mutton Kristeen Mans, MD  Outpatient Primary MD for the patient is Christina Helper, MD  LOS - 1  Chief Complaint  Patient presents with   Altered Mental Status        Brief Narrative:  51 y.o. 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 admitted on 07/30/2021 with acute metabolic encephalopathy in the setting of AKI and elevated LFTs and diarrhea    -Assessment and Plan: 1)Acute elevation of LFTs--- GI input appreciated -Right upper quadrant ultrasound without acute findings -Viral acute hepatitis panel pending -CK and TSH WNL -Denies EtOH use    Latest Ref Rng & Units 07/31/2021    5:11 AM 07/30/2021    1:00 PM 06/13/2021    8:18 AM  Hepatic Function  Total Protein 6.5 - 8.1 g/dL 6.9   7.6   7.7    Albumin 3.5 - 5.0 g/dL 3.2   3.7   4.1    AST 15 - 41 U/L 417   607   34    ALT 0 - 44 U/L 606   698   36    Alk Phosphatase 38 - 126 U/L 94   112   63    Total Bilirubin 0.3 - 1.2 mg/dL 2.2   3.0   0.7      2)AKI----acute kidney injury on CKD 3b- worsening renal function due to dehydration in the setting of recent diarrhea -Creatinine was 2.66 on 07/27/2021 from a baseline usually around 1.5 -Creatinine currently improving with hydration - renally adjust medications, avoid nephrotoxic agents / dehydration  / hypotension  3) acute metabolic cephalopathy--- due to #1 #2 above -Should improve with improvement in renal and hepatic dysfunction - 4)Left eye conjunctivitis--patient's left eye typically does not close since her last surgery so she has to use artificial tears eyedrops from time to time-suspect superimposed infection - please see photos in epic -Tobramycin as ordered -Consider ophthalmology consult if fails to  improve  5)Diarrhea--- leukocytosis noted, stool for C. difficile and stool pathogen pending  6)HTN--- BP stable at this time continue to hold BP meds  7)Morbid Obesity-/presumed OSA -Low calorie diet, portion control and increase physical activity discussed with patient -Outpatient work-up for OSA pending -Body mass index is 55.21 kg/m.  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  9)DM2-recent A1c 6.6 reflecting good diabetic control PTA Use Novolog/Humalog Sliding scale insulin with Accu-Cheks/Fingersticks as ordered   10) chronic diastolic dysfunction CHF/HFpEF--- no acute exacerbation at this time Echo from 07/27/2021 with EF of 55 to 60% with mild pulmonary artery hypertension -Continue to hold diuretics at this time given AKI and diarrhea -Clinically appears compensated  Disposition/Need for in-Hospital Stay- patient unable to be discharged at this time due to diarrhea with AKI and elevated LFTs requiring further investigations and work-up  Status is: Inpatient   Disposition: The patient is from: Home              Anticipated d/c is to: Home              Anticipated d/c date is: 2 days              Patient currently is not medically stable  to d/c. Barriers: Not Clinically Stable-   Code Status :  -  Code Status: Full Code   Family Communication:    (patient is alert, awake and coherent)  Discussed with patient's mother at bedside  DVT Prophylaxis  :   - SCDs   heparin injection 5,000 Units Start: 07/30/21 2200   Lab Results  Component Value Date   PLT 264 07/31/2021    Inpatient Medications  Scheduled Meds:  allopurinol  100 mg Oral Daily   ferrous sulfate  325 mg Oral Daily   heparin  5,000 Units Subcutaneous Q8H   insulin aspart  0-9 Units Subcutaneous TID WC   Continuous Infusions:  sodium chloride 75 mL/hr at 07/31/21 0607   PRN Meds:.albuterol, hydrALAZINE, ondansetron **OR**  ondansetron (ZOFRAN) IV, polyethylene glycol   Anti-infectives (From admission, onward)    None       Subjective: Christina Davenport today has no fevers, no emesis,  No chest pain,   -Patient's mother is at bedside -Patient more coherent -Diarrhea is not worse -Oral intake is fair  Objective: Vitals:   07/30/21 2049 07/30/21 2312 07/31/21 0504 07/31/21 1501  BP: 112/73 127/81 138/83 128/68  Pulse: 97 89 99 100  Resp: _0 Temp: 99.4 F (37.4 C) 98.1 F (36.7 C) 98.6 F (37 C) 98.5 F (36.9 C)  TempSrc: Oral  Oral Oral  SpO2: 92% 91% 92% 90%  Weight: (!) 150.5 kg     Height: _1  (1.651 m)       Intake/Output Summary (Last 24 hours) at 07/31/2021 2012 Last data filed at 07/31/2021 1100 Gross per 24 hour  Intake 764.88 ml  Output 205 ml  Net 559.88 ml   Filed Weights   07/30/21 1210 07/30/21 1215 07/30/21 2049  Weight: (!) 153.8 kg (!) 151.6 kg (!) 150.5 kg    Physical Exam  Gen:- Awake Alert, morbidly obese in no acute distress HEENT:-Palpable scalp shunt, left eye conjunctiva injected, facial asymmetry which is not new -Left facial droop-which is not new Neck-Supple Neck,No JVD,.  Lungs-  CTAB , fair symmetrical air movement CV- S1, S2 normal, regular  Abd-  +ve B.Sounds, Abd Soft, No tenderness, increased truncal adiposity Extremity/Skin:- Trace  edema, pedal pulses present  Psych-affect is appropriate, oriented x3 Neuro-generalized weakness no new focal deficits, no tremors  Data Reviewed: I have personally reviewed following labs and imaging studies  CBC: Recent Labs  Lab 07/30/21 1300 07/30/21 1913 07/31/21 0511  WBC 10.9* 11.6* 12.6*  NEUTROABS 8.1*  --   --   HGB 12.2 12.6 11.4*  HCT 44.8 46.8* 43.2  MCV 77.5* 77.7* 79.1*  PLT 263 279 160   Basic Metabolic Panel: Recent Labs  Lab 07/27/21 1407 07/30/21 1300 07/30/21 1913 07/31/21 0511  NA 141 140  --  146*  K 4.7 4.1  --  4.3  CL 95* 100  --  103  CO2 25 32  --  33*  GLUCOSE  112* 119*  --  96  BUN 71* 77*  --  61*  CREATININE 2.66* 2.07* 1.92* 1.61*  CALCIUM 9.4 8.8*  --  9.0  MG 2.0  --   --   --    GFR: Estimated Creatinine Clearance: 63 mL/min (A) (by C-G formula based on SCr of 1.61 mg/dL (H)). Liver Function Tests: Recent Labs  Lab 07/30/21 1300 07/31/21 0511  AST 607* 417*  ALT 698* 606*  ALKPHOS 112 94  BILITOT 3.0* 2.2*  PROT 7.6  6.9  ALBUMIN 3.7 3.2*   Cardiac Enzymes: Recent Labs  Lab 07/31/21 0511  CKTOTAL 97   BNP (last 3 results) No results for input(s): PROBNP in the last 8760 hours. HbA1C: No results for input(s): HGBA1C in the last 72 hours. Sepsis Labs: _0 (procalcitonin:4,lacticidven:4) ) Recent Results (from the past 240 hour(s))  C Difficile Quick Screen w PCR reflex     Status: None   Collection Time: 07/31/21  6:01 PM  Result Value Ref Range Status   C Diff antigen NEGATIVE NEGATIVE Final   C Diff toxin NEGATIVE NEGATIVE Final   C Diff interpretation No C. difficile detected.  Final    Comment: Performed at [facility], [address] [place], [place] [address]    Radiology Studies: CT Head Wo Contrast  Result Date: 07/30/2021 CLINICAL DATA:  Mental status changes, unknown cause. EXAM: CT HEAD WITHOUT CONTRAST TECHNIQUE: Contiguous axial images were obtained from the base of the skull through the vertex without intravenous contrast. RADIATION DOSE REDUCTION: This exam was performed according to the departmental dose-optimization program which includes automated exposure control, adjustment of the mA and/or kV according to patient size and/or use of iterative reconstruction technique. COMPARISON:  None Available. FINDINGS: Brain: Right frontal ventriculostomy catheter terminates in the ventricles. Ventricle size is normal. Midline suboccipital craniotomy noted. Foramen magnum is decompressed. 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 No acute  infarct, hemorrhage, or mass lesion is present. Ventricles are of normal size. No significant extra-axial fluid collection is present. Vascular: No hyperdense vessel or unexpected calcification. Skull: Metallic fragment noted in the posterior left neck, likely corresponding to remote trauma of the left occipital skull and mastoid. Metallic density present along the lateral floor of the middle cranial fossa. Sinuses/Orbits: The paranasal sinuses and mastoid air cells are clear. The globes and orbits are within normal limits. IMPRESSION: 1. Right frontal ventriculostomy catheter terminates in the ventricles. Ventricle size is normal. 2. 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 3. Metallic fragment in the posterior left neck, likely corresponding to remote trauma of the left occipital skull and mastoid. 4. Metallic density along the lateral floor of the middle cranial fossa. Electronically Signed   By: San Morelle M.D.   On: 07/30/2021 15:09   US RENAL  Result Date: 07/31/2021 CLINICAL DATA:  Acute kidney injury. EXAM: RENAL / URINARY TRACT ULTRASOUND COMPLETE COMPARISON:  Renal sonogram dated Jul 04, 2021 FINDINGS: Right Kidney: Renal measurements: 10.5 x 5.4 x 5.3 = volume: 156 mL. Echogenicity within normal limits. No mass or hydronephrosis visualized. Left Kidney: Renal measurements: 9.7 x 5.3 x 4.8 = volume: 130 mL. Echogenicity within normal limits. No mass or hydronephrosis visualized. Bladder: Appears normal for degree of bladder distention. Other: Complex avascular masslike structure abutting the superior aspect of the uterus measuring at least 7.4 x 5.4 x 8.2 cm. IMPRESSION: 1. evidence of nephrolithiasis or hydronephrosis. Limited evaluation due to bowel gas shadowing. 2. Complex avascular mass abutting the superior aspect of the uterus measuring at least 7.4 x 5.4 x 8.2 cm, this may be uterine or adnexal region, further evaluation with pelvic  sonogram would be helpful. Electronically Signed   By: Keane Police D.O.   On: 07/31/2021 11:09   US Abdomen Limited  Result Date: 07/30/2021 CLINICAL DATA:  Abnormal LFTs EXAM: ULTRASOUND ABDOMEN LIMITED RIGHT UPPER QUADRANT COMPARISON:  None Available. FINDINGS: Gallbladder: Filled with echogenic shadowing calculi. Appears to be wall thickening with  no significant pericholecystic fluid. Negative sonographic Murphy sign. Common bile duct: Diameter: 3 mm Liver: No focal lesion identified. Within normal limits in parenchymal echogenicity. Portal vein is patent on color Doppler imaging with normal direction of blood flow towards the liver. Other: None. IMPRESSION: Cholelithiasis. Electronically Signed   By: Ofilia Neas M.D.   On: 07/30/2021 16:21   DG Chest Portable 1 View  Result Date: 07/30/2021 CLINICAL DATA:  Shortness of breath.  Mental status changes. EXAM: PORTABLE CHEST 1 VIEW COMPARISON:  02/25/2009 FINDINGS: Mildly degraded exam due to AP portable technique and patient body habitus. VP shunt catheter projects over the right chest. Apical lordotic positioning. Midline trachea. Cardiomegaly accentuated by AP portable technique. No right and no definite left pleural effusion. Breast tissue projects over the left lung base. No pneumothorax. Interstitial prominence is accentuated by low lung volumes. No well-defined lobar consolidation. IMPRESSION: Decreased sensitivity and specificity exam due to technique related factors, as described above. Cardiomegaly and low lung volumes. Pulmonary interstitial thickening, accentuated by low lung volumes. Considerations include mild pulmonary venous congestion or the sequelae of asthma/chronic bronchitis (patient is a never smoker). Electronically Signed   By: Abigail Miyamoto M.D.   On: 07/30/2021 13:22     Scheduled Meds:  allopurinol  100 mg Oral Daily   ferrous sulfate  325 mg Oral Daily   heparin  5,000 Units Subcutaneous Q8H   insulin aspart  0-9 Units  Subcutaneous TID WC   Continuous Infusions:  sodium chloride 75 mL/hr at 07/31/21 0607     LOS: 1 day    Roxan Hockey M.D on 07/31/2021 at 8:12 PM  Go to [email or web address] - for contact info  [facility] - Office  [phone]  If 7PM-7AM, please contact night-coverage [email or web address] Password [email or web address] 07/31/2021, 8:12 PM

## 2021-07-31 NOTE — Progress Notes (Signed)
PT has not had a BM for my shift this nurse has not been able to collect a sample.

## 2021-07-31 NOTE — Consult Note (Signed)
$'@LOGO'S$ @   Referring Provider: Deno Etienne, PA-C Primary Care Physician:  Fayrene Helper, MD Primary Gastroenterologist:  Dr. Abbey Chatters  Date of Admission: 07/30/21 Date of Consultation:  07/31/21  Reason for Consultation:  Transaminitis   HPI:  Christina Davenport is a 51 y.o. 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  Per review of H&P, patient's mother reported patient has had intermittent confusion for the last several weeks, however yesterday when the mother went to the patient's house, she had persistent confusion and some visual hallucinations (seeing a man in the house).  Patient's mother reported patient had not logged into work and was having trouble accessing her phone.  Patient's mother denied recent nausea or vomiting though patient did have a poor appetite for the past several days.  Patient also acknowledged getting lightheaded/dizzy upon standing for the past several days.  ED course: Initial vital signs in the ED with SPO2 75% on room air, otherwise vital signs stable.  Labs remarkable for: WBC 10.9 (H), creatinine 2.07 (H), AST 607 (H), ALT 698 (H), total bilirubin 3.0 (H), INR 1.6 (H).  BNP 395 (H).  Lactic acid normal.  Ammonia normal. UA negative.  EtOH <10.  UDS negative.  Acute hepatitis panel in process.  Imaging: Chest x-ray-cardiomegaly and low lung volumes, pulmonary interstitial thickening with considerations including mild pulmonary venous congestion or sequela of asthma/chronic bronchitis.  CT head without contrast: No acute findings.  RUQ ultrasound with cholelithiasis, normal-appearing liver.  She was started on IV fluids.  Diuretics and antihypertensive medications  held.  Consult:  Patient provided most of history, but mother also at bedside assisting.  Patient reports for the last couple months, she has been feeling "rough", tired, sluggish, sleeping more than normal, increased lower extremity edema, increased weakness.  For the last week or so, she has been having some confusion.  States she did not realize this, but her mom told her she was saying things that did not make sense and was also having hallucinations.  This has resolved.  She is also been having increased lightheadedness and dizziness, primarily with position changes.  Reports on Sunday, she went to take a tub bath, but was too weak to get up.  Then states she passed out when trying to get out of the tub.  She woke up, crawled to her phone, and called her mother and sister.  By the time her mother got to her house, she had made it back into her bed.  Mother states this is the first time she was told that she actually passed out, that she had just fallen could not get up.   Denies any recent cold or flulike symptoms.  She has had decreased appetite with poor p.o. intake for the last week or more.  She has been having diarrhea for the last 3 days, typically after trying to eat dinner with 1 or 2 large watery bowel movements which is new for her.  Previously with soft, formed bowel movements.  Denies BRBPR, melena, nocturnal stools, recent antibiotics, or sick contacts.  Reports she had 3 additional episodes of diarrhea since presenting to the hospital.  Denies abdominal pain, nausea, vomiting, heartburn symptoms.  Denies chest pain, heart palpitations, shortness of breath,  cough.    New medications/adjustments: She was recently prescribed spironolactone and allopurinol, but had not started either of these medications.  Her Lasix was increased from 20 to 60 mg daily on 5/26.  Tylenol: Took 1000 mg of tylenol yesterday. Usually Tylenol 2-3 days a week he recently due to left eye soreness. Left eye has  been red. Doesn't close all the way since prior brain surgery, but usually isn't this red.   Alcohol: Couple times a year.  Illicit drug use: None  No Fhx of liver disease. 1/2 sister with crohn's. Aunt has thyroid problems.   Past Medical History:  Diagnosis Date   Brain tumor (benign) ([place]) 2011   excision followed by radiation   CHF (congestive heart failure) (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   History of brain tumor 12/28/2015   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       Hypertension    Obesity    Ovarian cyst 01/08/2012   PCO (polycystic ovaries)    S/P right oophorectomy 07/07/2015   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    Past Surgical History:  Procedure Laterality Date   BRAIN SURGERY  11/12   COLONOSCOPY WITH PROPOFOL N/A 09/18/2020   Procedure: COLONOSCOPY WITH PROPOFOL;  Surgeon: Eloise Harman, DO;  [facility];  Service: Endoscopy;  Laterality: N/A;  8:30am   LAPAROTOMY Bilateral 07/07/2015   Procedure: EXPLORATORY LAPAROTOMY  LEFT OVARIAN CYSTECTOMY, LEFT PARATUBAL CYSTECTOMY;  Surgeon: Servando Salina, MD;  Location: [facility];  Service: Gynecology;  Laterality: Bilateral;   repair of chiari malformation  05/02   SALPINGOOPHORECTOMY Right 07/07/2015   Procedure: RIGHT SALPINGO OOPHORECTOMY;  Surgeon: Servando Salina, MD;  Location: [facility];  Service: Gynecology;  Laterality: Right;    tumor removed  Brain, non cancerous   12/2009    Prior to Admission medications   Medication Sig Start Date End Date Taking? Authorizing Provider  acetaminophen (TYLENOL) 500 MG tablet Take 500 mg by mouth every 6 (six) hours as needed for mild pain.   Yes [provider]  allopurinol (ZYLOPRIM) 100 MG tablet Take 100 mg by mouth daily. 07/26/21  Yes [provider]  amLODipine-valsartan (EXFORGE) 10-320 MG tablet Take 1 tablet by mouth daily. Patient taking differently: Take 1 tablet by mouth in the morning. 08/16/20  Yes Fayrene Helper, MD  atorvastatin (LIPITOR) 20 MG tablet TAKE 1 TABLET(20 MG) BY MOUTH DAILY Patient taking differently: Take 20 mg by mouth daily. 01/08/21  Yes Fayrene Helper, MD  carvedilol (COREG) 6.25 MG tablet Take 6.25 mg by mouth 2 (two) times daily. 07/18/21  Yes [provider]  ferrous sulfate 325 (65 FE) MG EC tablet Take 1 tablet by mouth daily. 07/26/21  Yes [provider]  furosemide (LASIX) 20 MG tablet Take 3 tablets (60 mg total) by mouth daily. 07/20/21  Yes Branch, Alphonse Guild, MD  meclizine (ANTIVERT) 25 MG tablet Take 1 tablet (25 mg total) by mouth 3 (three) times daily as needed for dizziness. 04/26/21  Yes Lindell Spar, MD  spironolactone (ALDACTONE) 25 MG tablet Take 0.5 tablets (12.5 mg total) by mouth daily. 07/20/21  Yes Branch, Alphonse Guild, MD  White Petrolatum-Mineral Oil (EYE LUBRICANT) OINT Place 1 tablet into both eyes in the morning, at noon, and at bedtime.   Yes [provider]  Cholecalciferol 25 MCG (1000 UT) tablet Take 1,000 Units by mouth daily.    [provider]    Current Facility-Administered Medications  Medication Dose Route Frequency Provider Last Rate Last Admin   0.9 %  sodium chloride infusion   Intravenous Continuous Jonetta Osgood, MD 75 mL/hr at 07/31/21 0607 New Bag at 07/31/21 0607   albuterol (PROVENTIL) (2.5 MG/3ML) 0.083% nebulizer solution 2.5 mg  2.5 mg  Nebulization Q2H PRN Jonetta Osgood, MD       allopurinol (ZYLOPRIM) tablet 100 mg  100 mg Oral Daily Ghimire, Henreitta Leber, MD       ferrous sulfate tablet 325 mg  325 mg Oral Daily Jonetta Osgood, MD   325 mg at 07/30/21 1922   heparin injection 5,000 Units  5,000 Units Subcutaneous Q8H Jonetta Osgood, MD   5,000 Units at 07/31/21 0938   hydrALAZINE (APRESOLINE) injection 10 mg  10 mg Intravenous Q6H PRN Ghimire, Henreitta Leber, MD       insulin aspart (novoLOG) injection 0-9 Units  0-9 Units Subcutaneous TID WC Ghimire, Henreitta Leber, MD       ondansetron (ZOFRAN) tablet 4 mg  4 mg Oral Q6H PRN Ghimire, Henreitta Leber, MD       Or   ondansetron (ZOFRAN) injection 4 mg  4 mg Intravenous Q6H PRN Ghimire, Henreitta Leber, MD       polyethylene glycol (MIRALAX / GLYCOLAX) packet 17 g  17 g Oral Daily PRN Jonetta Osgood, MD        Allergies as of 07/30/2021 - Review Complete 07/30/2021  Allergen Reaction Noted   Enalapril maleate Hives, Itching, and Swelling 12/09/2007   Metformin Nausea And Vomiting 12/09/2007   Other Itching 12/20/2019    Family History  Problem Relation Age of Onset   Chiari malformation Mother        requiring surgery    Hypertension Father    Hypertension Sister    Cancer Maternal Grandmother        kidney   Heart disease Maternal Grandmother    Cancer Paternal Grandmother        stomach   Cancer Paternal Grandfather        lung   Colon polyps Neg Hx    Colon cancer Neg Hx     Social History   Socioeconomic History   Marital status: Single    Spouse name: Not on file   Number of children: Not on file   Years of education: Not on file   Highest education level: Not on file  Occupational History   Not on file  Tobacco Use   Smoking status: Never   Smokeless tobacco: Never  Vaping Use   Vaping Use: Never used  Substance and Sexual Activity   Alcohol use: No   Drug use: No   Sexual activity: Yes    Birth control/protection: None  Other Topics Concern    Not on file  Social History Narrative   Not on file   Social Determinants of Health  Financial Resource Strain: Not on file  Food Insecurity: Not on file  Transportation Needs: Not on file  Physical Activity: Not on file  Stress: Not on file  Social Connections: Not on file  Intimate Partner Violence: Not on file    Review of Systems: Gen: See HPI CV: See HPI Resp: See HPI GI: See HPI. GU : Denies urinary burning, urinary frequency, urinary incontinence.  MS: Denies joint pain. Derm: Denies rash. Psych: Denies depression, anxiety. Heme: See HPI  Physical Exam: Vital signs in last 24 hours: Temp:  [98 F (36.7 C)-99.4 F (37.4 C)] 98.6 F (37 C) (06/06 0504) Pulse Rate:  [86-99] 99 (06/06 0504) Resp:  [13-22] 18 (06/06 0504) BP: (112-161)/(55-103) 138/83 (06/06 0504) SpO2:  [75 %-98 %] 92 % (06/06 0504) Weight:  [150.5 kg-153.8 kg] 150.5 kg (06/05 2049) Last BM Date : 07/30/21 General:   Alert,  Well-developed, well-nourished, pleasant and cooperative in NAD Head:  Normocephalic and atraumatic, left facial droop.  Eyes: No scleral icterus. Left eye with erythematous/injected conjunctiva and tenderness around left eye.   Ears:  Normal auditory acuity. Nose:  No deformity, discharge,  or lesions. Lungs:  Clear throughout to auscultation.   No wheezes, crackles, or rhonchi. No acute distress. Heart:  Regular rate and rhythm; no murmurs, clicks, rubs,  or gallops. Abdomen:  Soft, nontender and nondistended. No masses, hepatosplenomegaly or hernias noted. Normal bowel sounds, without guarding, and without rebound.   Rectal:  Deferred Msk:  Symmetrical without gross deformities.  Extremities:  With 1+ pitting edema in LE.  Neurologic:  Alert and  oriented x4 though seems very tired, falling asleep at times during consultation. Per patient, she didn't sleep last night. Grossly normal neurologically. Skin:  Intact without significant lesions or rashes. Psych:  Normal  mood and affect.  Intake/Output from previous day: 06/05 0701 - 06/06 0700 In: 1004.9 [P.O.:240; I.V.:764.9] Out: 7 [Urine:6; Stool:1] Intake/Output this shift: No intake/output data recorded.  Lab Results: Recent Labs    07/30/21 1300 07/30/21 1913 07/31/21 0511  WBC 10.9* 11.6* 12.6*  HGB 12.2 12.6 11.4*  HCT 44.8 46.8* 43.2  PLT 263 279 264   BMET Recent Labs    07/30/21 1300 07/30/21 1913 07/31/21 0511  NA 140  --  146*  K 4.1  --  4.3  CL 100  --  103  CO2 32  --  33*  GLUCOSE 119*  --  96  BUN 77*  --  61*  CREATININE 2.07* 1.92* 1.61*  CALCIUM 8.8*  --  9.0   LFT Recent Labs    07/30/21 1300 07/31/21 0511  PROT 7.6 6.9  ALBUMIN 3.7 3.2*  AST 607* 417*  ALT 698* 606*  ALKPHOS 112 94  BILITOT 3.0* 2.2*   PT/INR Recent Labs    07/30/21 1718 07/31/21 0511  LABPROT 19.1* 18.0*  INR 1.6* 1.5*   Studies/Results: CT Head Wo Contrast  Result Date: 07/30/2021 CLINICAL DATA:  Mental status changes, unknown cause. EXAM: CT HEAD WITHOUT CONTRAST TECHNIQUE: Contiguous axial images were obtained from the base of the skull through the vertex without intravenous contrast. RADIATION DOSE REDUCTION: This exam was performed according to the departmental dose-optimization program which includes automated exposure control, adjustment of the mA and/or kV according to patient size and/or use of iterative reconstruction technique. COMPARISON:  None Available. FINDINGS: Brain: Right frontal ventriculostomy catheter terminates in the ventricles. Ventricle size is normal. Midline suboccipital craniotomy noted. Foramen magnum is decompressed. Additional left suboccipital craniotomy  noted with chronic encephalomalacia of the inferior left cerebellum. No acute infarct, hemorrhage, or mass lesion is present. Ventricles are of normal size. No significant extra-axial fluid collection is present. Vascular: No hyperdense vessel or unexpected calcification. Skull: Metallic fragment noted  in the posterior left neck, likely corresponding to remote trauma of the left occipital skull and mastoid. Metallic density present along the lateral floor of the middle cranial fossa. Sinuses/Orbits: The paranasal sinuses and mastoid air cells are clear. The globes and orbits are within normal limits. IMPRESSION: 1. Right frontal ventriculostomy catheter terminates in the ventricles. Ventricle size is normal. 2. 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 3. Metallic fragment in the posterior left neck, likely corresponding to remote trauma of the left occipital skull and mastoid. 4. Metallic density along the lateral floor of the middle cranial fossa. Electronically Signed   By: San Morelle M.D.   On: 07/30/2021 15:09   US Abdomen Limited  Result Date: 07/30/2021 CLINICAL DATA:  Abnormal LFTs EXAM: ULTRASOUND ABDOMEN LIMITED RIGHT UPPER QUADRANT COMPARISON:  None Available. FINDINGS: Gallbladder: Filled with echogenic shadowing calculi. Appears to be wall thickening with no significant pericholecystic fluid. Negative sonographic Murphy sign. Common bile duct: Diameter: 3 mm Liver: No focal lesion identified. Within normal limits in parenchymal echogenicity. Portal vein is patent on color Doppler imaging with normal direction of blood flow towards the liver. Other: None. IMPRESSION: Cholelithiasis. Electronically Signed   By: Ofilia Neas M.D.   On: 07/30/2021 16:21   DG Chest Portable 1 View  Result Date: 07/30/2021 CLINICAL DATA:  Shortness of breath.  Mental status changes. EXAM: PORTABLE CHEST 1 VIEW COMPARISON:  02/25/2009 FINDINGS: Mildly degraded exam due to AP portable technique and patient body habitus. VP shunt catheter projects over the right chest. Apical lordotic positioning. Midline trachea. Cardiomegaly accentuated by AP portable technique. No right and no definite left pleural effusion. Breast tissue projects over the left lung  base. No pneumothorax. Interstitial prominence is accentuated by low lung volumes. No well-defined lobar consolidation. IMPRESSION: Decreased sensitivity and specificity exam due to technique related factors, as described above. Cardiomegaly and low lung volumes. Pulmonary interstitial thickening, accentuated by low lung volumes. Considerations include mild pulmonary venous congestion or the sequelae of asthma/chronic bronchitis (patient is a never smoker). Electronically Signed   By: Abigail Miyamoto M.D.   On: 07/30/2021 13:22    Impression: 51 y.o. 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  She was found to have AKI and transaminitis, admitted for further evaluation and management.  GI consulted due to transaminitis.  Transaminitis: Acutely elevated liver enzymes on admission with AST 607, ALT 698, total bilirubin 3.0, INR 1.6.  Previously, LFTs had been essentially normal, only with slight elevation of ALT at 36 on 4/19, prior to this, LFTs entirely normal.  RUQ ultrasound this admission with normal-appearing liver.  Acute hepatitis panel in process.  UDS negative.  She has been started on gentle IV hydration and LFTs improved somewhat today with AST 417, ALT 606, total bilirubin 2.2, INR 1.5.  Etiology unclear, possibly secondary to ischemic injury with reports of lightheadedness and dizziness at home as well as possible syncopal episode on Sunday while trying to get out of  the bathtub, and AKI. She does report progressive weakness over the last month, so I will check a CK. No new medications started recently.  Lasix was increased recently from 20 mg to 60 mg on 5/26.  She does take Tylenol, but only up to 1000 mg a few times a  week.  No significant alcohol use.  No illicit drug use.  No known family history of liver disease. Clinically, she is without symptoms of acute liver failure, no HE. For now, will continue to trend and monitor closely for decompensation. If LFTs do not continue to improve, will need to complete additional work-up. Avoid hepatotoxic medications.   Diarrhea: New onset diarrhea x3 days prior to admission, postprandial with watery stools.  Patient reports 3 additional episodes of watery diarrhea since being at the hospital.  Denies BRBPR, melena, abdominal pain, nocturnal stools, recent antibiotics or sick contacts. Will plan to check stool studies and TSH.  AKI:  On gentle IV hydration.  Diuretics currently held.  Management per hospitalist.  Altered mental status/fatigue/lethargy:  Thought to be secondary to acute metabolic encephalopathy in the setting of AKI/transaminitis.  CT head without acute changes.  Clinically, she is alert and oriented x4 though still feeling very fatigued.  Will defer further management/evaluation to hospitalist as appropriate.  Erythema of left eye conjunctiva with surrounding tenderness:  New over the last week or so. Notified Dr. Denton Brick who has also examined patient and likely plans to discuss with ophthalmology.     Plan: CK Continue gentle IV hydration per hospitalist.  Avoid hepatotoxic medications. Consider discontinuing allopurinol.  HFP and INR daily. Monitor for hepatic encephalopathy. Follow-up on acute hepatitis panel. If LFTs do not continue to improve, she will need additional serologic work-up. C. difficile and GI pathogen panel. TSH.    LOS: 1 day    07/31/2021, 7:40 AM   Aliene Altes, PA-C [facility] Gastroenterology

## 2021-07-31 NOTE — TOC Progression Note (Signed)
  Transition of Care (TOC) Screening Note   Patient Details  Name: Christina Davenport Date of Birth: 1970-08-21   Transition of Care [facility]) CM/SW Contact:    Shade Flood, LCSW Phone Number: 07/31/2021, 10:51 AM    Transition of Care Department [facility]) has reviewed patient and no TOC needs have been identified at this time. We will continue to monitor patient advancement through interdisciplinary progression rounds. If new patient transition needs arise, please place a TOC consult.

## 2021-07-31 NOTE — Progress Notes (Signed)
Stool sample collected

## 2021-08-01 ENCOUNTER — Inpatient Hospital Stay (HOSPITAL_COMMUNITY): Payer: 59

## 2021-08-01 DIAGNOSIS — N1831 Chronic kidney disease, stage 3a: Secondary | ICD-10-CM

## 2021-08-01 DIAGNOSIS — G9340 Encephalopathy, unspecified: Secondary | ICD-10-CM | POA: Diagnosis not present

## 2021-08-01 DIAGNOSIS — R7401 Elevation of levels of liver transaminase levels: Secondary | ICD-10-CM | POA: Diagnosis not present

## 2021-08-01 DIAGNOSIS — N179 Acute kidney failure, unspecified: Secondary | ICD-10-CM | POA: Diagnosis not present

## 2021-08-01 LAB — GASTROINTESTINAL PANEL BY PCR, STOOL (REPLACES STOOL CULTURE)

## 2021-08-01 LAB — BLOOD GAS, ARTERIAL
Acid-Base Excess: 10.4 mmol/L — ABNORMAL HIGH (ref 0.0–2.0)
Bicarbonate: 39.2 mmol/L — ABNORMAL HIGH (ref 20.0–28.0)
Drawn by: 27407
FIO2: 32 %
O2 Saturation: 95.7 %
Patient temperature: 37
pCO2 arterial: 76 mmHg (ref 32–48)
pH, Arterial: 7.32 — ABNORMAL LOW (ref 7.35–7.45)
pO2, Arterial: 75 mmHg — ABNORMAL LOW (ref 83–108)

## 2021-08-01 LAB — CBC
HCT: 46.3 % — ABNORMAL HIGH (ref 36.0–46.0)
Hemoglobin: 11.8 g/dL — ABNORMAL LOW (ref 12.0–15.0)
MCH: 20.6 pg — ABNORMAL LOW (ref 26.0–34.0)
MCHC: 25.5 g/dL — ABNORMAL LOW (ref 30.0–36.0)
MCV: 80.9 fL (ref 80.0–100.0)
Platelets: 227 10*3/uL (ref 150–400)
RBC: 5.72 MIL/uL — ABNORMAL HIGH (ref 3.87–5.11)
RDW: 23.9 % — ABNORMAL HIGH (ref 11.5–15.5)
WBC: 8.9 10*3/uL (ref 4.0–10.5)
nRBC: 5.3 % — ABNORMAL HIGH (ref 0.0–0.2)

## 2021-08-01 LAB — HCV INTERPRETATION

## 2021-08-01 LAB — COMPREHENSIVE METABOLIC PANEL
ALT: 469 U/L — ABNORMAL HIGH (ref 0–44)
AST: 217 U/L — ABNORMAL HIGH (ref 15–41)
Albumin: 3 g/dL — ABNORMAL LOW (ref 3.5–5.0)
Alkaline Phosphatase: 96 U/L (ref 38–126)
Anion gap: 9 (ref 5–15)
BUN: 56 mg/dL — ABNORMAL HIGH (ref 6–20)
CO2: 28 mmol/L (ref 22–32)
Calcium: 8.3 mg/dL — ABNORMAL LOW (ref 8.9–10.3)
Chloride: 106 mmol/L (ref 98–111)
Creatinine, Ser: 1.75 mg/dL — ABNORMAL HIGH (ref 0.44–1.00)
GFR, Estimated: 35 mL/min — ABNORMAL LOW (ref >60)
Glucose, Bld: 87 mg/dL (ref 70–99)
Potassium: 4.9 mmol/L (ref 3.5–5.1)
Sodium: 143 mmol/L (ref 135–145)
Total Bilirubin: 1.9 mg/dL — ABNORMAL HIGH (ref 0.3–1.2)
Total Protein: 6.9 g/dL (ref 6.5–8.1)

## 2021-08-01 LAB — GLUCOSE, CAPILLARY
Glucose-Capillary: 100 mg/dL — ABNORMAL HIGH (ref 70–99)
Glucose-Capillary: 100 mg/dL — ABNORMAL HIGH (ref 70–99)
Glucose-Capillary: 98 mg/dL (ref 70–99)
Glucose-Capillary: 107 mg/dL — ABNORMAL HIGH (ref 70–99)

## 2021-08-01 LAB — HIV ANTIBODY (ROUTINE TESTING W REFLEX): HIV Screen 4th Generation wRfx: NONREACTIVE

## 2021-08-01 MED ORDER — OXYCODONE HCL 5 MG PO TABS
5.0000 mg | ORAL_TABLET | Freq: Once | ORAL | Status: AC
  Administered 2021-08-01: 5 mg via ORAL
  Filled 2021-08-01: qty 1

## 2021-08-01 NOTE — Progress Notes (Signed)
Critical lab - pC02 at 66. MD Memom aware. Awaiting orders.

## 2021-08-01 NOTE — Progress Notes (Signed)
Subjective: She denies any abdominal pain, no nausea or vomiting. No BMs this morning, last one was last night.   Objective: Vital signs in last 24 hours: Temp:  [97.8 F (36.6 C)-98.5 F (36.9 C)] 98.3 F (36.8 C) (06/07 0751) Pulse Rate:  [90-100] 90 (06/07 0748) Resp:  [17-18] 17 (06/07 0331) BP: (118-141)/(68-86) 129/82 (06/07 0748) SpO2:  [86 %-96 %] 96 % (06/07 0751) Last BM Date : 07/31/21 General:   Alert and oriented, pleasant Head:  Normocephalic and atraumatic. Eyes:  No icterus, sclera clear. Conjuctiva pink.  Mouth:  Without lesions, mucosa pink and moist.  Heart:  S1, S2 present, no murmurs noted.  Lungs: Clear to auscultation bilaterally, without wheezing, rales, or rhonchi.  Abdomen:  Bowel sounds present, soft, non-tender, non-distended. No HSM or hernias noted. No rebound or guarding. No masses appreciated  Msk:  Symmetrical without gross deformities. Normal posture. Pulses:  Normal pulses noted. Extremities:  Without clubbing or edema. Neurologic:  Alert and  oriented x4;  grossly normal neurologically. Skin:  Warm and dry, intact without significant lesions.  Psych:  Alert and cooperative. Normal mood and affect.  Intake/Output from previous day: 06/06 0701 - 06/07 0700 In: -  Out: 200 [Urine:200] Intake/Output this shift: No intake/output data recorded.  Lab Results: Recent Labs    07/30/21 1913 07/31/21 0511 08/01/21 0547  WBC 11.6* 12.6* 8.9  HGB 12.6 11.4* 11.8*  HCT 46.8* 43.2 46.3*  PLT 279 264 227   BMET Recent Labs    07/30/21 1300 07/30/21 1913 07/31/21 0511 08/01/21 0547  NA 140  --  146* 143  K 4.1  --  4.3 4.9  CL 100  --  103 106  CO2 32  --  33* 28  GLUCOSE 119*  --  96 87  BUN 77*  --  61* 56*  CREATININE 2.07* 1.92* 1.61* 1.75*  CALCIUM 8.8*  --  9.0 8.3*   LFT Recent Labs    07/30/21 1300 07/31/21 0511 08/01/21 0547  PROT 7.6 6.9 6.9  ALBUMIN 3.7 3.2* 3.0*  AST 607* 417* 217*  ALT 698* 606* 469*  ALKPHOS  112 94 96  BILITOT 3.0* 2.2* 1.9*   PT/INR Recent Labs    07/31/21 0511 07/31/21 2107  LABPROT 18.0* 17.4*  INR 1.5* 1.4*    Studies/Results: CT Head Wo Contrast  Result Date: 07/30/2021 CLINICAL DATA:  Mental status changes, unknown cause. EXAM: CT HEAD WITHOUT CONTRAST TECHNIQUE: Contiguous axial images were obtained from the base of the skull through the vertex without intravenous contrast. RADIATION DOSE REDUCTION: This exam was performed according to the departmental dose-optimization program which includes automated exposure control, adjustment of the mA and/or kV according to patient size and/or use of iterative reconstruction technique. COMPARISON:  None Available. FINDINGS: Brain: Right frontal ventriculostomy catheter terminates in the ventricles. Ventricle size is normal. Midline suboccipital craniotomy noted. Foramen magnum is decompressed. 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 No acute infarct, hemorrhage, or mass lesion is present. Ventricles are of normal size. No significant extra-axial fluid collection is present. Vascular: No hyperdense vessel or unexpected calcification. Skull: Metallic fragment noted in the posterior left neck, likely corresponding to remote trauma of the left occipital skull and mastoid. Metallic density present along the lateral floor of the middle cranial fossa. Sinuses/Orbits: The paranasal sinuses and mastoid air cells are clear. The globes and orbits are within normal limits. IMPRESSION: 1. Right frontal ventriculostomy catheter terminates in the ventricles.  Ventricle size is normal. 2. 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 3. Metallic fragment in the posterior left neck, likely corresponding to remote trauma of the left occipital skull and mastoid. 4. Metallic density along the lateral floor of the middle cranial fossa.  Electronically Signed   By: San Morelle M.D.   On: 07/30/2021 15:09   US RENAL  Result Date: 07/31/2021 CLINICAL DATA:  Acute kidney injury. EXAM: RENAL / URINARY TRACT ULTRASOUND COMPLETE COMPARISON:  Renal sonogram dated Jul 04, 2021 FINDINGS: Right Kidney: Renal measurements: 10.5 x 5.4 x 5.3 = volume: 156 mL. Echogenicity within normal limits. No mass or hydronephrosis visualized. Left Kidney: Renal measurements: 9.7 x 5.3 x 4.8 = volume: 130 mL. Echogenicity within normal limits. No mass or hydronephrosis visualized. Bladder: Appears normal for degree of bladder distention. Other: Complex avascular masslike structure abutting the superior aspect of the uterus measuring at least 7.4 x 5.4 x 8.2 cm. IMPRESSION: 1. evidence of nephrolithiasis or hydronephrosis. Limited evaluation due to bowel gas shadowing. 2. Complex avascular mass abutting the superior aspect of the uterus measuring at least 7.4 x 5.4 x 8.2 cm, this may be uterine or adnexal region, further evaluation with pelvic sonogram would be helpful. Electronically Signed   By: Keane Police D.O.   On: 07/31/2021 11:09   US Abdomen Limited  Result Date: 07/30/2021 CLINICAL DATA:  Abnormal LFTs EXAM: ULTRASOUND ABDOMEN LIMITED RIGHT UPPER QUADRANT COMPARISON:  None Available. FINDINGS: Gallbladder: Filled with echogenic shadowing calculi. Appears to be wall thickening with no significant pericholecystic fluid. Negative sonographic Murphy sign. Common bile duct: Diameter: 3 mm Liver: No focal lesion identified. Within normal limits in parenchymal echogenicity. Portal vein is patent on color Doppler imaging with normal direction of blood flow towards the liver. Other: None. IMPRESSION: Cholelithiasis. Electronically Signed   By: Ofilia Neas M.D.   On: 07/30/2021 16:21   DG Chest Portable 1 View  Result Date: 07/30/2021 CLINICAL DATA:  Shortness of breath.  Mental status changes. EXAM: PORTABLE CHEST 1 VIEW COMPARISON:  02/25/2009  FINDINGS: Mildly degraded exam due to AP portable technique and patient body habitus. VP shunt catheter projects over the right chest. Apical lordotic positioning. Midline trachea. Cardiomegaly accentuated by AP portable technique. No right and no definite left pleural effusion. Breast tissue projects over the left lung base. No pneumothorax. Interstitial prominence is accentuated by low lung volumes. No well-defined lobar consolidation. IMPRESSION: Decreased sensitivity and specificity exam due to technique related factors, as described above. Cardiomegaly and low lung volumes. Pulmonary interstitial thickening, accentuated by low lung volumes. Considerations include mild pulmonary venous congestion or the sequelae of asthma/chronic bronchitis (patient is a never smoker). Electronically Signed   By: Abigail Miyamoto M.D.   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  She was found to have AKI and transaminitis, admitted for further evaluation and management.  GI consulted due to transaminitis.   Transaminitis: acutely elevated LFTs on admission, though appear to be trending down today with AST 217(607), ALT 469( 698), T bili 1.9(3) and INR 1.4 yesterday (1.6). RUQ [place] this admission unremarkable. Acute Hep panel in process. UDS  negative. CK WNL.  Reportedly on no new medications recently. Takes up to 1g tylenol a few times per week. No s/s of HE. Suspect elevated LFTs secondary to ischemic injury given possible syncopal episode on Sunday and pattern of decline in values.   Diarrhea: new onset x3 days prior to admission, postprandial watery stools. Denies rectal  bleeding/melena, associated abdominal pain, fecal incontinence, recent antibiotics, sick contacts or travel. C diff is negative, GI path panel is pending. TSH 1.328. No diarrhea so far today. Will follow for GI pathogen panel results.  Plan: Avoid hepatotoxic meds HFP and INR daily Monitor for signs of HE Continue to follow for acute hep panel Continue to follow for GI path panel   LOS: 2 days    08/01/2021, 9:38 AM   Maddox Bratcher L. Alver Sorrow, MSN, APRN, AGNP-C Adult-Gerontology Nurse Practitioner [facility]

## 2021-08-01 NOTE — Progress Notes (Signed)
PROGRESS NOTE     Christina Davenport, is a 51 y.o. female, DOB - December 25, 1970, QBH:[id number]  Admit date - 07/30/2021   Admitting Physician Evalee Mutton Kristeen Mans, MD  Outpatient Primary MD for the patient is Fayrene Helper, MD  LOS - 2  Chief Complaint  Patient presents with   Altered Mental Status        Brief Narrative:  51 y.o. 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 admitted on 07/30/2021 with acute metabolic encephalopathy in the setting of AKI and elevated LFTs and diarrhea    -Assessment and Plan: 1)Acute elevation of LFTs--- GI input appreciated -Right upper quadrant ultrasound without acute findings -Viral acute hepatitis panel pending -CK and TSH WNL -Denies EtOH use -Suspect this may be related to hypovolemia/ischemia -Overall LFTs are trending down    Latest Ref Rng & Units 08/01/2021    5:47 AM 07/31/2021    5:11 AM 07/30/2021    1:00 PM  Hepatic Function  Total Protein 6.5 - 8.1 g/dL 6.9   6.9   7.6    Albumin 3.5 - 5.0 g/dL 3.0   3.2   3.7    AST 15 - 41 U/L 217   417   607    ALT 0 - 44 U/L 469   606   698    Alk Phosphatase 38 - 126 U/L 96   94   112    Total Bilirubin 0.3 - 1.2 mg/dL 1.9   2.2   3.0      2)AKI----acute kidney injury on CKD 3b- worsening renal function due to dehydration in the setting of recent diarrhea -Creatinine was 2.66 on 07/27/2021 from a baseline usually around 1.5 -Creatinine currently improving with hydration, noted to be 1.7 today - renally adjust medications, avoid nephrotoxic agents / dehydration  / hypotension  3) acute metabolic cephalopathy--- due to #1 #2 above -Mental status has appeared to be slowly improving -Although she is somnolent, she wakes up to voice and answers questions appropriately -Checking ABG since her history/exam would be highly suspicious for sleep apnea  4)Left eye  conjunctivitis--patient's left eye typically does not close since her last surgery so she has to use artificial tears eyedrops from time to time-suspect superimposed infection - please see photos in epic -Tobramycin as ordered -Consider ophthalmology consult if fails to improve  5)Diarrhea--- leukocytosis was present, now resolved. Stool for C. difficile and stool pathogen negative  6)HTN--- BP stable at this time continue to hold BP meds  7)Morbid Obesity-/presumed OSA -Low calorie diet, portion control and increase physical activity discussed with patient -Outpatient work-up for OSA pending -Body mass index is 55.21 kg/m.  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  Suspect this is a chronic respiratory acidosis.  Will discuss with her pulmonologist.  Disposition/Need for in-Hospital Stay- patient unable to be discharged at this time due to diarrhea with AKI and elevated LFTs requiring further  investigations and work-up  Status is: Inpatient   Disposition: The patient is from: Home              Anticipated d/c is to: Home              Anticipated d/c date is: 2 days              Patient currently is not medically stable to d/c. Barriers: Not Clinically Stable-   Code Status :  -  Code Status: Full Code   Family Communication:    (patient is alert, awake and coherent)  Discussed with patient's mother at bedside  DVT Prophylaxis  :   - SCDs    heparin injection 5,000 Units Start: 07/30/21 2200   Lab Results  Component Value Date   PLT 227 08/01/2021    Inpatient Medications  Scheduled Meds:  allopurinol  100 mg Oral Daily   ferrous sulfate  325 mg Oral Daily   heparin  5,000 Units Subcutaneous Q8H   insulin aspart  0-9 Units Subcutaneous TID WC   tobramycin  2 drop Left Eye Q6H   Continuous Infusions:   PRN Meds:.albuterol, hydrALAZINE, ondansetron **OR** ondansetron (ZOFRAN) IV   Anti-infectives (From admission, onward)    None       Subjective: Christina Davenport is sleeping on my arrival, but she does wake up to voice.  She answers questions appropriately.  Does admit to feeling increasingly fatigued lately and falls asleep during the day.  She has been having frequent stools.  Her parents are at the bedside.  Objective: Vitals:   08/01/21 0331 08/01/21 0748 08/01/21 0751 08/01/21 1218  BP: (!) 141/86 129/82  108/77  Pulse: 90 90  79  Resp: 17   14  Temp: 97.9 F (36.6 C)  98.3 F (36.8 C) 98.7 F (37.1 C)  TempSrc: Oral   Oral  SpO2: 90% (!) 86% 96% 98%  Weight:      Height:        Intake/Output Summary (Last 24 hours) at 08/01/2021 1927 Last data filed at 08/01/2021 6712 Gross per 24 hour  Intake 480 ml  Output --  Net 480 ml   Filed Weights   07/30/21 1210 07/30/21 1215 07/30/21 2049  Weight: (!) 153.8 kg (!) 151.6 kg (!) 150.5 kg    Physical Exam  Gen:- Awake Alert, morbidly obese in no acute distress HEENT:-Palpable scalp shunt, left eye conjunctiva injected, facial asymmetry which is not new -Left facial droop-which is not new Neck-Supple Neck,No JVD,.  Lungs-  CTAB , fair symmetrical air movement CV- S1, S2 normal, regular  Abd-  +ve B.Sounds, Abd Soft, No tenderness, increased truncal adiposity Extremity/Skin:- Trace  edema, pedal pulses present  Psych-affect is appropriate, oriented x3 Neuro-generalized weakness no new focal deficits, no tremors  Data Reviewed: I have  personally reviewed following labs and imaging studies  CBC: Recent Labs  Lab 07/30/21 1300 07/30/21 1913 07/31/21 0511 08/01/21 0547  WBC 10.9* 11.6* 12.6* 8.9  NEUTROABS 8.1*  --   --   --   HGB 12.2 12.6 11.4* 11.8*  HCT 44.8 46.8* 43.2 46.3*  MCV 77.5* 77.7* 79.1* 80.9  PLT 263 279 264 458   Basic Metabolic Panel: Recent Labs  Lab 07/27/21 1407 07/30/21 1300 07/30/21 1913 07/31/21 0511 08/01/21 0547  NA 141 140  --  146* 143  K 4.7 4.1  --  4.3 4.9  CL 95* 100  --  103 106  CO2 25 32  --  33* 28  GLUCOSE 112* 119*  --  96 87  BUN 71* 77*  --  61* 56*  CREATININE 2.66* 2.07* 1.92* 1.61* 1.75*  CALCIUM 9.4 8.8*  --  9.0 8.3*  MG 2.0  --   --   --   --    GFR: Estimated Creatinine Clearance: 58 mL/min (A) (by C-G formula based on SCr of 1.75 mg/dL (H)). Liver Function Tests: Recent Labs  Lab 07/30/21 1300 07/31/21 0511 08/01/21 0547  AST 607* 417* 217*  ALT 698* 606* 469*  ALKPHOS 112 94 96  BILITOT 3.0* 2.2* 1.9*  PROT 7.6 6.9 6.9  ALBUMIN 3.7 3.2* 3.0*   Cardiac Enzymes: Recent Labs  Lab 07/31/21 0511  CKTOTAL 97   BNP (last 3 results) No results for input(s): PROBNP in the last 8760 hours. HbA1C: No results for input(s): HGBA1C in the last 72 hours. Sepsis Labs: _0 (procalcitonin:4,lacticidven:4) ) Recent Results (from the past 240 hour(s))  C Difficile Quick Screen w PCR reflex     Status: None   Collection Time: 07/31/21  6:01 PM  Result Value Ref Range Status   C Diff antigen NEGATIVE NEGATIVE Final   C Diff toxin NEGATIVE NEGATIVE Final   C Diff interpretation No C. difficile detected.  Final    Comment: Performed at [facility], [address], [place], [place] [address]  Gastrointestinal Panel by PCR , Stool     Status: None   Collection Time: 07/31/21  6:02 PM   Specimen: STOOL  Result Value Ref Range Status   Campylobacter species NOT DETECTED NOT DETECTED Final   Plesimonas shigelloides NOT DETECTED NOT DETECTED Final    Salmonella species NOT DETECTED NOT DETECTED Final   Yersinia enterocolitica NOT DETECTED NOT DETECTED Final   Vibrio species NOT DETECTED NOT DETECTED Final   Vibrio cholerae NOT DETECTED NOT DETECTED Final   Enteroaggregative E coli (EAEC) NOT DETECTED NOT DETECTED Final   Enteropathogenic E coli (EPEC) NOT DETECTED NOT DETECTED Final   Enterotoxigenic E coli (ETEC) NOT DETECTED NOT DETECTED Final   Shiga like toxin producing E coli (STEC) NOT DETECTED NOT DETECTED Final   Shigella/Enteroinvasive E coli (EIEC) NOT DETECTED NOT DETECTED Final   Cryptosporidium NOT DETECTED NOT DETECTED Final   Cyclospora cayetanensis NOT DETECTED NOT DETECTED Final   Entamoeba histolytica NOT DETECTED NOT DETECTED Final   Giardia lamblia NOT DETECTED NOT DETECTED Final   Adenovirus F40/41 NOT DETECTED NOT DETECTED Final   Astrovirus NOT DETECTED NOT DETECTED Final   Norovirus GI/GII NOT DETECTED NOT DETECTED Final   Rotavirus A NOT DETECTED NOT DETECTED Final   Sapovirus (I, II, IV, and V) NOT DETECTED NOT DETECTED Final    Comment: Performed at [facility], [address], [place], [place] [address]    Radiology Studies: US RENAL  Result Date: 07/31/2021 CLINICAL DATA:  Acute kidney injury. EXAM: RENAL / URINARY TRACT ULTRASOUND COMPLETE COMPARISON:  Renal sonogram dated Jul 04, 2021 FINDINGS: Right Kidney: Renal measurements: 10.5 x 5.4 x 5.3 = volume: 156 mL. Echogenicity within normal limits. No mass or hydronephrosis visualized. Left Kidney: Renal measurements: 9.7 x 5.3 x 4.8 = volume: 130 mL. Echogenicity within normal limits. No mass or hydronephrosis visualized. Bladder: Appears normal for degree of bladder distention. Other: Complex avascular masslike structure abutting the superior aspect of the uterus measuring at least 7.4 x 5.4 x 8.2 cm. IMPRESSION: 1. evidence of nephrolithiasis or hydronephrosis. Limited evaluation due to bowel gas shadowing. 2. Complex avascular  mass abutting the  superior aspect of the uterus measuring at least 7.4 x 5.4 x 8.2 cm, this may be uterine or adnexal region, further evaluation with pelvic sonogram would be helpful. Electronically Signed   By: Keane Police D.O.   On: 07/31/2021 11:09   DG CHEST PORT 1 VIEW  Result Date: 08/01/2021 CLINICAL DATA:  Shortness of breath this afternoon. EXAM: PORTABLE CHEST 1 VIEW COMPARISON:  AP chest 07/30/2021; chest two views 06/07/2021 in 12/22/2020 FINDINGS: VP shunt catheter tubing again overlies the right hemithorax. Cardiac silhouette is again mildly enlarged. Mediastinal contours are within normal limits. No definite pleural effusion. Improved lung volumes. Mild bilateral interstitial thickening is similar to prior. No pneumothorax is seen. No acute skeletal abnormality. IMPRESSION: No significant change from prior. Mild bilateral interstitial thickening may represent mild interstitial pulmonary edema. Electronically Signed   By: Yvonne Kendall M.D.   On: 08/01/2021 14:57     Scheduled Meds:  allopurinol  100 mg Oral Daily   ferrous sulfate  325 mg Oral Daily   heparin  5,000 Units Subcutaneous Q8H   insulin aspart  0-9 Units Subcutaneous TID WC   tobramycin  2 drop Left Eye Q6H   Continuous Infusions:     LOS: 2 days    Kathie Dike M.D on 08/01/2021 at 7:27 PM  Go to [email or web address] - for contact info  [facility] - Office  [phone]  If 7PM-7AM, please contact night-coverage [email or web address]  08/01/2021, 7:27 PM

## 2021-08-01 NOTE — Progress Notes (Signed)
Patient was found lethargic and difficult to arouse first thing this morning due to oxygen saturation in the 20s. Patient was placed on oxygen and woken up and stats quickly came up. Patient has been sleeping heavily on and off all day so patient has been on continous oxygen at 3L and continuous pulse oximetry. Provider made aware.

## 2021-08-02 ENCOUNTER — Ambulatory Visit: Payer: 59 | Admitting: Family Medicine

## 2021-08-02 DIAGNOSIS — J9611 Chronic respiratory failure with hypoxia: Secondary | ICD-10-CM

## 2021-08-02 DIAGNOSIS — G478 Other sleep disorders: Secondary | ICD-10-CM | POA: Diagnosis not present

## 2021-08-02 DIAGNOSIS — J9612 Chronic respiratory failure with hypercapnia: Secondary | ICD-10-CM | POA: Diagnosis not present

## 2021-08-02 DIAGNOSIS — N179 Acute kidney failure, unspecified: Secondary | ICD-10-CM | POA: Diagnosis not present

## 2021-08-02 DIAGNOSIS — R7401 Elevation of levels of liver transaminase levels: Secondary | ICD-10-CM | POA: Diagnosis not present

## 2021-08-02 DIAGNOSIS — G9340 Encephalopathy, unspecified: Secondary | ICD-10-CM | POA: Diagnosis not present

## 2021-08-02 DIAGNOSIS — I2729 Other secondary pulmonary hypertension: Secondary | ICD-10-CM

## 2021-08-02 DIAGNOSIS — N1831 Chronic kidney disease, stage 3a: Secondary | ICD-10-CM | POA: Diagnosis not present

## 2021-08-02 LAB — HEPATIC FUNCTION PANEL
ALT: 303 U/L — ABNORMAL HIGH (ref 0–44)
AST: 97 U/L — ABNORMAL HIGH (ref 15–41)
Albumin: 2.9 g/dL — ABNORMAL LOW (ref 3.5–5.0)
Alkaline Phosphatase: 79 U/L (ref 38–126)
Bilirubin, Direct: 0.8 mg/dL — ABNORMAL HIGH (ref 0.0–0.2)
Indirect Bilirubin: 0.6 mg/dL (ref 0.3–0.9)
Total Bilirubin: 1.4 mg/dL — ABNORMAL HIGH (ref 0.3–1.2)
Total Protein: 6.3 g/dL — ABNORMAL LOW (ref 6.5–8.1)

## 2021-08-02 LAB — MRSA NEXT GEN BY PCR, NASAL: MRSA by PCR Next Gen: NOT DETECTED

## 2021-08-02 LAB — HEPATITIS PANEL, ACUTE

## 2021-08-02 LAB — BASIC METABOLIC PANEL
Anion gap: 7 (ref 5–15)
BUN: 51 mg/dL — ABNORMAL HIGH (ref 6–20)
CO2: 31 mmol/L (ref 22–32)
Calcium: 8.2 mg/dL — ABNORMAL LOW (ref 8.9–10.3)
Chloride: 102 mmol/L (ref 98–111)
Creatinine, Ser: 1.77 mg/dL — ABNORMAL HIGH (ref 0.44–1.00)
GFR, Estimated: 35 mL/min — ABNORMAL LOW (ref >60)
Glucose, Bld: 73 mg/dL (ref 70–99)
Potassium: 4.2 mmol/L (ref 3.5–5.1)
Sodium: 140 mmol/L (ref 135–145)

## 2021-08-02 LAB — GLUCOSE, CAPILLARY
Glucose-Capillary: 96 mg/dL (ref 70–99)
Glucose-Capillary: 94 mg/dL (ref 70–99)
Glucose-Capillary: 92 mg/dL (ref 70–99)

## 2021-08-02 LAB — PROTIME-INR
INR: 1.3 — ABNORMAL HIGH (ref 0.8–1.2)
Prothrombin Time: 15.6 seconds — ABNORMAL HIGH (ref 11.4–15.2)

## 2021-08-02 MED ORDER — METOCLOPRAMIDE HCL 5 MG/ML IJ SOLN
10.0000 mg | Freq: Once | INTRAMUSCULAR | Status: AC
  Administered 2021-08-02: 10 mg via INTRAVENOUS
  Filled 2021-08-02: qty 2

## 2021-08-02 MED ORDER — SALINE SPRAY 0.65 % NA SOLN
1.0000 | NASAL | Status: DC | PRN
Start: 2021-08-02 — End: 2021-08-05
  Administered 2021-08-02: 1 via NASAL
  Filled 2021-08-02: qty 44

## 2021-08-02 MED ORDER — CHLORHEXIDINE GLUCONATE CLOTH 2 % EX PADS
6.0000 | MEDICATED_PAD | Freq: Every day | CUTANEOUS | Status: DC
  Administered 2021-08-02 – 2021-08-04 (×3): 6 via TOPICAL

## 2021-08-02 MED ORDER — DIPHENHYDRAMINE HCL 50 MG/ML IJ SOLN
25.0000 mg | Freq: Once | INTRAMUSCULAR | Status: AC
  Administered 2021-08-02: 25 mg via INTRAVENOUS
  Filled 2021-08-02: qty 1

## 2021-08-02 MED ORDER — SUMATRIPTAN SUCCINATE 6 MG/0.5ML ~~LOC~~ SOLN
6.0000 mg | Freq: Once | SUBCUTANEOUS | Status: AC
Start: 2021-08-02 — End: 2021-08-02
  Administered 2021-08-02: 6 mg via SUBCUTANEOUS
  Filled 2021-08-02 (×2): qty 0.5

## 2021-08-02 NOTE — Consult Note (Signed)
[facility]   Patient name: Christina Davenport Admit date: 07/30/2021  DOB: August 07, 1970 LOS: 3  MRN: [id number] Consult date: 08/02/2021  Referring provider: Dr. Roderic Palau, [facility] CC: Dyspnea    History:  51 yo female found by her mother confused and hallucinating.  Noted to have AKI, elevated liver enzymes causes metabolic encephalopathy.  Mental status improved, but had persistent daytime sleepiness.  Noted to have chronic hypoxic/hypercapnic respiratory failure on ABG with concern for sleep disordered breathing.  She is morbidly obese with BMI 55.  PCCM consulted to assist with respiratory management.  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, Diastolic CHF, Depression, DM type 2, HTN, Polycystic ovaries, Syringomyelia, Chiari malformation s/p repair  Significant events:  6/05 Admit 6/08 transfer to SDU for CPAP/Bipap  Studies:  Echo 07/27/21 >> EF 55 to 60%, mild LVH, RVSP 40.4 mmHg  Micro:    Lines:     Antibiotics:    Consults:  Gastroenterology    Interim history:    Vital signs:  BP 124/82   Pulse 95   Temp 99 F (37.2 C) (Oral)   Resp 20   Ht '5\' 5"'$  (1.651 m)   Wt (!) 150.5 kg   LMP 07/18/2021   SpO2 99%   BMI 55.21 kg/m   Intake/output:  I/O last 3 completed shifts: In: 480 [P.O.:480] Out: -    Physical exam:   General - alert Eyes - pupils reactive ENT - no sinus tenderness, no stridor Cardiac - regular rate/rhythm, no murmur Chest - equal breath sounds b/l, no wheezing or rales Abdomen - soft, non tender, + bowel sounds Extremities - no cyanosis, clubbing, or edema Skin - no rashes Neuro - normal strength, moves extremities, follows commands, Lt facial droop Psych - normal mood and behavior  Best practice:   DVT - SQ heparin SUP - NA Nutrition - Heart heathy carb modified   Assessment/plan:   Daytime hypersomnia with chronic hypoxic/hypercapnic respiratory failure  likely from sleep disordered breathing with obstructive sleep apnea and/or obesity hypoventilation syndrome. Pulmonary hypertension due to sleep disordered breathing. - seen in May 2023 as sleep consult and has sleep study ordered; can see if case manager can expedite when this gets scheduled - can try on Bipap at night and prn during the day - might need to set up trilogy/astral home vent as bridge from hospital to home before she can get additional sleep testing done - facial droop might cause issues with mask leak, depending on which mask she tries - adjust oxygen to keep SpO2 90 to 95% - needs intensive weight loss program  Elevated liver enzymes. - per primary team and gastroenterology  AKI with CKD 3b. Acute metabolic encephalopathy. DM type 2. Chronic diastolic CHF. HTN. - per primary team  D/w Dr. Roderic Palau  Resolved hospital problems:    Goals of care/Family discussions:  Code status: full code  Updated pt's parents at bedside.  Labs:      Latest Ref Rng & Units 08/02/2021    5:29 AM 08/01/2021    5:47 AM 07/31/2021    5:11 AM  CMP  Glucose 70 - 99 mg/dL 73  87  96   BUN 6 - 20 mg/dL 51  56  61   Creatinine 0.44 - 1.00 mg/dL 1.77  1.75  1.61   Sodium 135 - 145 mmol/L 140  143  146   Potassium 3.5 - 5.1 mmol/L 4.2  4.9  4.3  Chloride 98 - 111 mmol/L 102  106  103   CO2 22 - 32 mmol/L 31  28  33   Calcium 8.9 - 10.3 mg/dL 8.2  8.3  9.0   Total Protein 6.5 - 8.1 g/dL 6.3  6.9  6.9   Total Bilirubin 0.3 - 1.2 mg/dL 1.4  1.9  2.2   Alkaline Phos 38 - 126 U/L 79  96  94   AST 15 - 41 U/L 97  217  417   ALT 0 - 44 U/L 303  469  606        Latest Ref Rng & Units 08/01/2021    5:47 AM 07/31/2021    5:11 AM 07/30/2021    7:13 PM  CBC  WBC 4.0 - 10.5 K/uL 8.9  12.6  11.6   Hemoglobin 12.0 - 15.0 g/dL 11.8  11.4  12.6   Hematocrit 36.0 - 46.0 % 46.3  43.2  46.8   Platelets 150 - 400 K/uL 227  264  279     ABG    Component Value Date/Time   PHART 7.32 (L) 08/01/2021  1445   PCO2ART 76 (HH) 08/01/2021 1445   PO2ART 75 (L) 08/01/2021 1445   HCO3 39.2 (H) 08/01/2021 1445   O2SAT 95.7 08/01/2021 1445    CBG (last 3)  Recent Labs    08/01/21 2007 08/02/21 0704 08/02/21 1106  GLUCAP 107* 96 94     Past surgical history:  She  has a past surgical history that includes repair of chiari malformation (05/02); Brain surgery (11/12); tumor removed (Brain, non cancerous); laparotomy (Bilateral, 07/07/2015); Salpingoophorectomy (Right, 07/07/2015); and Colonoscopy with propofol (N/A, 09/18/2020).  Social history:  She  reports that she has never smoked. She has never used smokeless tobacco. She reports that she does not drink alcohol and does not use drugs.   Review of systems:  Reviewed and negative  Family history:  Her family history includes Cancer in her maternal grandmother, paternal grandfather, and paternal grandmother; Chiari malformation in her mother; Heart disease in her maternal grandmother; Hypertension in her father and sister.    Medications:   No current facility-administered medications on file prior to encounter.   Current Outpatient Medications on File Prior to Encounter  Medication Sig   acetaminophen (TYLENOL) 500 MG tablet Take 500 mg by mouth every 6 (six) hours as needed for mild pain.   allopurinol (ZYLOPRIM) 100 MG tablet Take 100 mg by mouth daily.   amLODipine-valsartan (EXFORGE) 10-320 MG tablet Take 1 tablet by mouth daily. (Patient taking differently: Take 1 tablet by mouth in the morning.)   atorvastatin (LIPITOR) 20 MG tablet TAKE 1 TABLET(20 MG) BY MOUTH DAILY (Patient taking differently: Take 20 mg by mouth daily.)   carvedilol (COREG) 6.25 MG tablet Take 6.25 mg by mouth 2 (two) times daily.   ferrous sulfate 325 (65 FE) MG EC tablet Take 1 tablet by mouth daily.   furosemide (LASIX) 20 MG tablet Take 3 tablets (60 mg total) by mouth daily.   meclizine (ANTIVERT) 25 MG tablet Take 1 tablet (25 mg total) by mouth 3  (three) times daily as needed for dizziness.   spironolactone (ALDACTONE) 25 MG tablet Take 0.5 tablets (12.5 mg total) by mouth daily.   White Petrolatum-Mineral Oil (EYE LUBRICANT) OINT Place 1 tablet into both eyes in the morning, at noon, and at bedtime.   Cholecalciferol 25 MCG (1000 UT) tablet Take 1,000 Units by mouth daily.     Signature:  Chesley Mires, MD [place] Pager - [phone] 08/02/2021, 3:12 PM

## 2021-08-02 NOTE — Progress Notes (Signed)
Gastroenterology Progress Note   Referring Provider: No ref. provider found Primary Care Physician:  Fayrene Helper, MD Primary Gastroenterologist:  Dr.  Patient ID: Loman Brooklyn; [id number]; October 09, 1970   Subjective:    Day started with headache but feeling better now. Had one loose stool each day for two days. No BM today so far. No abdominal pain. Eating ok, does not prefer the food. More alert today.  Objective:   Vital signs in last 24 hours: Temp:  [97.8 F (36.6 C)-98.2 F (36.8 C)] 97.8 F (36.6 C) (06/08 0308) Pulse Rate:  [84-89] 89 (06/08 0308) Resp:  [16-18] 18 (06/08 0308) BP: (121-126)/(70-71) 126/71 (06/08 0308) SpO2:  [92 %-96 %] 92 % (06/08 0308) Last BM Date : 08/02/21 General:   Alert,  Well-developed, well-nourished, pleasant and cooperative in NAD Head:  Normocephalic and atraumatic. Eyes:  Sclera clear, no icterus.  Abdomen:  Soft, nontender and nondistended. No masses, hepatosplenomegaly or hernias noted. Normal bowel sounds, without guarding, and without rebound.   Extremities:  Without clubbing, deformity or edema. Neurologic:  Alert and  oriented x4;  grossly normal neurologically. No asterixis. Skin:  Intact without significant lesions or rashes. Psych:  Alert and cooperative. Normal mood and affect.  Intake/Output from previous day: 06/07 0701 - 06/08 0700 In: 480 [P.O.:480] Out: -  Intake/Output this shift: Total I/O In: 240 [P.O.:240] Out: -   Lab Results: CBC Recent Labs    07/30/21 1913 07/31/21 0511 08/01/21 0547  WBC 11.6* 12.6* 8.9  HGB 12.6 11.4* 11.8*  HCT 46.8* 43.2 46.3*  MCV 77.7* 79.1* 80.9  PLT 279 264 227   BMET Recent Labs    07/31/21 0511 08/01/21 0547 08/02/21 0529  NA 146* 143 140  K 4.3 4.9 4.2  CL 103 106 102  CO2 33* 28 31  GLUCOSE 96 87 73  BUN 61* 56* 51*  CREATININE 1.61* 1.75* 1.77*  CALCIUM 9.0 8.3* 8.2*   LFTs Recent Labs    07/31/21 0511 08/01/21 0547 08/02/21 0529  BILITOT  2.2* 1.9* 1.4*  BILIDIR  --   --  0.8*  IBILI  --   --  0.6  ALKPHOS 94 96 79  AST 417* 217* 97*  ALT 606* 469* 303*  PROT 6.9 6.9 6.3*  ALBUMIN 3.2* 3.0* 2.9*   No results for input(s): "LIPASE" in the last 72 hours. PT/INR Recent Labs    07/31/21 0511 07/31/21 2107 08/02/21 0529  LABPROT 18.0* 17.4* 15.6*  INR 1.5* 1.4* 1.3*        Imaging Studies: DG CHEST PORT 1 VIEW  Result Date: 08/01/2021 CLINICAL DATA:  Shortness of breath this afternoon. EXAM: PORTABLE CHEST 1 VIEW COMPARISON:  AP chest 07/30/2021; chest two views 06/07/2021 in 12/22/2020 FINDINGS: VP shunt catheter tubing again overlies the right hemithorax. Cardiac silhouette is again mildly enlarged. Mediastinal contours are within normal limits. No definite pleural effusion. Improved lung volumes. Mild bilateral interstitial thickening is similar to prior. No pneumothorax is seen. No acute skeletal abnormality. IMPRESSION: No significant change from prior. Mild bilateral interstitial thickening may represent mild interstitial pulmonary edema. Electronically Signed   By: Yvonne Kendall M.D.   On: 08/01/2021 14:57   US RENAL  Result Date: 07/31/2021 CLINICAL DATA:  Acute kidney injury. EXAM: RENAL / URINARY TRACT ULTRASOUND COMPLETE COMPARISON:  Renal sonogram dated Jul 04, 2021 FINDINGS: Right Kidney: Renal measurements: 10.5 x 5.4 x 5.3 = volume: 156 mL. Echogenicity within normal limits. No mass or hydronephrosis visualized.  Left Kidney: Renal measurements: 9.7 x 5.3 x 4.8 = volume: 130 mL. Echogenicity within normal limits. No mass or hydronephrosis visualized. Bladder: Appears normal for degree of bladder distention. Other: Complex avascular masslike structure abutting the superior aspect of the uterus measuring at least 7.4 x 5.4 x 8.2 cm. IMPRESSION: 1. evidence of nephrolithiasis or hydronephrosis. Limited evaluation due to bowel gas shadowing. 2. Complex avascular mass abutting the superior aspect of the uterus measuring  at least 7.4 x 5.4 x 8.2 cm, this may be uterine or adnexal region, further evaluation with pelvic sonogram would be helpful. Electronically Signed   By: Keane Police D.O.   On: 07/31/2021 11:09   US Abdomen Limited  Result Date: 07/30/2021 CLINICAL DATA:  Abnormal LFTs EXAM: ULTRASOUND ABDOMEN LIMITED RIGHT UPPER QUADRANT COMPARISON:  None Available. FINDINGS: Gallbladder: Filled with echogenic shadowing calculi. Appears to be wall thickening with no significant pericholecystic fluid. Negative sonographic Murphy sign. Common bile duct: Diameter: 3 mm Liver: No focal lesion identified. Within normal limits in parenchymal echogenicity. Portal vein is patent on color Doppler imaging with normal direction of blood flow towards the liver. Other: None. IMPRESSION: Cholelithiasis. Electronically Signed   By: Ofilia Neas M.D.   On: 07/30/2021 16:21   CT Head Wo Contrast  Result Date: 07/30/2021 CLINICAL DATA:  Mental status changes, unknown cause. EXAM: CT HEAD WITHOUT CONTRAST TECHNIQUE: Contiguous axial images were obtained from the base of the skull through the vertex without intravenous contrast. RADIATION DOSE REDUCTION: This exam was performed according to the departmental dose-optimization program which includes automated exposure control, adjustment of the mA and/or kV according to patient size and/or use of iterative reconstruction technique. COMPARISON:  None Available. FINDINGS: Brain: Right frontal ventriculostomy catheter terminates in the ventricles. Ventricle size is normal. Midline suboccipital craniotomy noted. Foramen magnum is decompressed. 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 No acute infarct, hemorrhage, or mass lesion is present. Ventricles are of normal size. No significant extra-axial fluid collection is present. Vascular: No hyperdense vessel or unexpected calcification. Skull: Metallic fragment noted in the  posterior left neck, likely corresponding to remote trauma of the left occipital skull and mastoid. Metallic density present along the lateral floor of the middle cranial fossa. Sinuses/Orbits: The paranasal sinuses and mastoid air cells are clear. The globes and orbits are within normal limits. IMPRESSION: 1. Right frontal ventriculostomy catheter terminates in the ventricles. Ventricle size is normal. 2. 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 3. Metallic fragment in the posterior left neck, likely corresponding to remote trauma of the left occipital skull and mastoid. 4. Metallic density along the lateral floor of the middle cranial fossa. Electronically Signed   By: San Morelle M.D.   On: 07/30/2021 15:09   DG Chest Portable 1 View  Result Date: 07/30/2021 CLINICAL DATA:  Shortness of breath.  Mental status changes. EXAM: PORTABLE CHEST 1 VIEW COMPARISON:  02/25/2009 FINDINGS: Mildly degraded exam due to AP portable technique and patient body habitus. VP shunt catheter projects over the right chest. Apical lordotic positioning. Midline trachea. Cardiomegaly accentuated by AP portable technique. No right and no definite left pleural effusion. Breast tissue projects over the left lung base. No pneumothorax. Interstitial prominence is accentuated by low lung volumes. No well-defined lobar consolidation. IMPRESSION: Decreased sensitivity and specificity exam due to technique related factors, as described above. Cardiomegaly and low lung volumes. Pulmonary interstitial thickening, accentuated by low lung volumes. Considerations include mild  pulmonary venous congestion or the sequelae of asthma/chronic bronchitis (patient is a never smoker). Electronically Signed   By: Abigail Miyamoto M.D.   On: 07/30/2021 13:22   ECHOCARDIOGRAM COMPLETE  Result Date: 07/27/2021    ECHOCARDIOGRAM REPORT   Patient Name:   ANELIA CARRIVEAU Date of Exam: 07/27/2021 Medical  Rec #:  [id number]         Height:       65.0 in Accession #:    [id number]        Weight:       344.2 lb Date of Birth:  [birth year]        BSA:          2.492 m Patient Age:    51 years          BP:           141/81 mmHg Patient Gender: F                 HR:           85 bpm. Exam Location:  [facility] Procedure: 2D Echo, Cardiac Doppler and Color Doppler Indications:    R06.02 (ICD-10-CM) - Shortness of breath  History:        Patient has no prior history of Echocardiogram examinations.                 CHF; Risk Factors:Hypertension, Diabetes and Dyslipidemia. Hx of                 COVID-19, Morbid Obesity.  Sonographer:    Alvino Chapel RCS Referring Phys: [id number] [place]  1. Left ventricular ejection fraction, by estimation, is 55 to 60%. The left ventricle has normal function. The left ventricle has no regional wall motion abnormalities. There is mild left ventricular hypertrophy. Left ventricular diastolic parameters were normal.  2. Right ventricular systolic function is normal. The right ventricular size is normal. There is mildly elevated pulmonary artery systolic pressure. The estimated right ventricular systolic pressure is 59.2 mmHg.  3. Right atrial size was mildly dilated.  4. The mitral valve is normal in structure. Trivial mitral valve regurgitation.  5. The aortic valve is tricuspid. Aortic valve regurgitation is not visualized. No aortic stenosis is present.  6. The inferior vena cava is dilated in size with <50% respiratory variability, suggesting right atrial pressure of 15 mmHg. FINDINGS  Left Ventricle: Left ventricular ejection fraction, by estimation, is 55 to 60%. The left ventricle has normal function. The left ventricle has no regional wall motion abnormalities. The left ventricular internal cavity size was normal in size. There is  mild left ventricular hypertrophy. Left ventricular diastolic parameters were normal. Right Ventricle: The right ventricular size is  normal. No increase in right ventricular wall thickness. Right ventricular systolic function is normal. There is mildly elevated pulmonary artery systolic pressure. The tricuspid regurgitant velocity is 2.52  m/s, and with an assumed right atrial pressure of 15 mmHg, the estimated right ventricular systolic pressure is 92.4 mmHg. Left Atrium: Left atrial size was normal in size. Right Atrium: Right atrial size was mildly dilated. Pericardium: Trivial pericardial effusion is present. Mitral Valve: The mitral valve is normal in structure. Trivial mitral valve regurgitation. Tricuspid Valve: The tricuspid valve is normal in structure. Tricuspid valve regurgitation is trivial. Aortic Valve: The aortic valve is tricuspid. Aortic valve regurgitation is not visualized. No aortic stenosis is present. Pulmonic Valve: The pulmonic valve was not well visualized. Pulmonic valve  regurgitation is not visualized. Aorta: The aortic root is normal in size and structure. Venous: The inferior vena cava is dilated in size with less than 50% respiratory variability, suggesting right atrial pressure of 15 mmHg. IAS/Shunts: The interatrial septum was not well visualized.  LEFT VENTRICLE PLAX 2D LVIDd:         5.00 cm   Diastology LVIDs:         2.90 cm   LV e' medial:    8.70 cm/s LV PW:         1.10 cm   LV E/e' medial:  15.1 LV IVS:        1.10 cm   LV e' lateral:   11.30 cm/s LVOT diam:     2.00 cm   LV E/e' lateral: 11.6 LV SV:         76 LV SV Index:   31 LVOT Area:     3.14 cm  RIGHT VENTRICLE RV S prime:     14.70 cm/s TAPSE (M-mode): 1.9 cm LEFT ATRIUM             Index        RIGHT ATRIUM           Index LA diam:        4.30 cm 1.73 cm/m   RA Area:     24.30 cm LA Vol (A2C):   78.6 ml 31.55 ml/m  RA Volume:   79.20 ml  31.79 ml/m LA Vol (A4C):   48.8 ml 19.59 ml/m LA Biplane Vol: 61.6 ml 24.72 ml/m  AORTIC VALVE LVOT Vmax:   122.00 cm/s LVOT Vmean:  77.600 cm/s LVOT VTI:    0.242 m  AORTA Ao Root diam: 3.10 cm MITRAL VALVE                 TRICUSPID VALVE MV Area (PHT): 4.39 cm     TR Peak grad:   25.4 mmHg MV Decel Time: 173 msec     TR Vmax:        252.00 cm/s MV E velocity: 131.00 cm/s MV A velocity: 108.00 cm/s  SHUNTS MV E/A ratio:  1.21         Systemic VTI:  0.24 m                             Systemic Diam: 2.00 cm Oswaldo Milian MD Electronically signed by Oswaldo Milian MD Signature Date/Time: 07/27/2021/5:31:21 PM    Final    US RENAL  Result Date: 07/05/2021 CLINICAL DATA:  Chronic renal disease EXAM: RENAL / URINARY TRACT ULTRASOUND COMPLETE COMPARISON:  None Available. FINDINGS: Right Kidney: Renal measurements: 9.0 x 4.9 x 4.9 cm = volume: 113 mL. Echogenicity within normal limits. No mass or hydronephrosis visualized. Left Kidney: Renal measurements: 9.7 x 5.3 x 3.9 cm = volume: 105 mL. Echogenicity within normal limits. No mass or hydronephrosis visualized. Bladder: Appears normal for degree of bladder distention. Other: None. IMPRESSION: No hydronephrosis. Electronically Signed   By: Lovey Newcomer M.D.   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  She was found to have AKI and transaminitis, admitted for further evaluation and management.  GI consulted due to transaminitis.   Transaminitis: Acutely elevated LFTs on admission, trending downward.  Total bilirubin 1.4 (initially 3.0), alk phos 79, AST 97 (initially 607), ALT 303 (initially 698).  INR 1.3.  Hepatitis B surface antigen negative, hepatitis C antibody negative, hepatitis A IgM negative.  Right upper quadrant ultrasound showed gallstones with gallbladder wall thickening but no significant  pericholecystic fluid, negative Murphy sign.  Common bile duct diameter 3 mm.  Urine drug screen negative.  Etiology unclear, possibly secondary to ischemic injury, LFTs improving.   Diarrhea: Recent onset, 3 days prior to admission, since admission has had a couple of loose stools.  Typically 1/day.  C. difficile negative.  GI pathogen panel negative.  Altered mental status/lethargy: Felt to be secondary to acute metabolic encephalopathy in the setting of acute kidney injury/transaminitis.  CT head without acute findings.  Ammonia level has been normal.  Question of possible asterixis yesterday on exam but does not appear to have any today.  Today she is much more alert.  Oriented.  Complex avascular masslike structure abutting the superior aspect of the uterus measuring 7.4 x 5.4 x 8.2 cm: Noted on renal ultrasound.  Pelvic ultrasound recommended.   Plan:   Pelvic ultrasound per attending. Trend LFTs.  Further evaluation if they do not continue to improve or return to normal. Monitor for persistent diarrhea.   LOS: 3 days   Laureen Ochs. Bernarda Caffey [facility] [phone] 6/8/20231:17 PM

## 2021-08-02 NOTE — Progress Notes (Signed)
Patient complaining of a headache that is 6/10 without N/V or other neuro changes. Received order for Imitrex injection.

## 2021-08-02 NOTE — Progress Notes (Signed)
PROGRESS NOTE     Christina Davenport, is a 51 y.o. female, DOB - October 20, 1970, [id number]  Admit date - 07/30/2021   Admitting Physician Evalee Mutton Kristeen Mans, MD  Outpatient Primary MD for the patient is Fayrene Helper, MD  LOS - 3  Chief Complaint  Patient presents with   Altered Mental Status        Brief Narrative:  51 y.o. 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 admitted on 07/30/2021 with acute metabolic encephalopathy in the setting of AKI and elevated LFTs and diarrhea    -Assessment and Plan: 1)Acute elevation of LFTs--- GI input appreciated -Right upper quadrant ultrasound without acute findings -Viral hepatitis panel negative -CK and TSH WNL -Denies EtOH use -Suspect this may be related to hypovolemia/ischemia -Overall LFTs are trending down    Latest Ref Rng & Units 08/02/2021    5:29 AM 08/01/2021    5:47 AM 07/31/2021    5:11 AM  Hepatic Function  Total Protein 6.5 - 8.1 g/dL 6.3  6.9  6.9   Albumin 3.5 - 5.0 g/dL 2.9  3.0  3.2   AST 15 - 41 U/L 97  217  417   ALT 0 - 44 U/L 303  469  606   Alk Phosphatase 38 - 126 U/L 79  96  94   Total Bilirubin 0.3 - 1.2 mg/dL 1.4  1.9  2.2   Bilirubin, Direct 0.0 - 0.2 mg/dL 0.8       2)AKI----acute kidney injury on CKD 3b- worsening renal function due to dehydration in the setting of recent diarrhea -Creatinine was 2.66 on 07/27/2021 from a baseline usually around 1.5 -Creatinine currently improving with hydration, noted to be 1.7 today - renally adjust medications, avoid nephrotoxic agents / dehydration  / hypotension  3) acute metabolic cephalopathy--- due to #1 #2 above -Mental status has appeared to be slowly improving -Although she is somnolent, she wakes up to voice and answers questions appropriately -ABG shows hypercapnia which appears to be chronic -Started on BiPAP, pulmonology  consult -Will likely need to be set up with [facility] prior to discharge until she can have a sleep study performed  4)Left eye conjunctivitis--patient's left eye typically does not close since her last surgery so she has to use artificial tears eyedrops from time to time-suspect superimposed infection - please see photos in epic -Tobramycin as ordered -Consider outpatient ophthalmology consult if fails to improve  5)Diarrhea--- leukocytosis was present, now resolved. Stool for C. difficile and stool pathogen negative -Overall stool frequency appears to have improved  6)HTN--- BP stable at this time continue to hold BP meds  7)Morbid Obesity-/presumed OSA/chronic respiratory acidosis -Low calorie diet, portion control and increase physical activity discussed with patient -Body mass index is 56.46 kg/m. 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  9)DM2-recent A1c 6.6 reflecting good diabetic control PTA Use Novolog/Humalog Sliding scale insulin with Accu-Cheks/Fingersticks as ordered   10) chronic diastolic dysfunction CHF/HFpEF--- no acute exacerbation at this time Echo from 07/27/2021 with EF of 55 to 60% with mild pulmonary artery hypertension -Continue to hold diuretics at this time given AKI and diarrhea -Clinically appears compensated  11) possible pelvic mass noted on renal ultrasound -We will discuss with patient and check pelvic ultrasound  Disposition/Need  for in-Hospital Stay- patient unable to be discharged at this time due to diarrhea with AKI and elevated LFTs requiring further investigations and work-up  Status is: Inpatient   Disposition: The patient is from: Home              Anticipated d/c is to: Home              Anticipated d/c date is: 2 days              Patient currently is not medically stable to d/c. Barriers: Not  Clinically Stable-   Code Status :  -  Code Status: Full Code   Family Communication:    (patient is alert, awake and coherent)  Discussed with patient's mother at bedside  DVT Prophylaxis  :   - SCDs   heparin injection 5,000 Units Start: 07/30/21 2200   Lab Results  Component Value Date   PLT 227 08/01/2021    Inpatient Medications  Scheduled Meds:  allopurinol  100 mg Oral Daily   Chlorhexidine Gluconate Cloth  6 each Topical Daily   ferrous sulfate  325 mg Oral Daily   heparin  5,000 Units Subcutaneous Q8H   insulin aspart  0-9 Units Subcutaneous TID WC   tobramycin  2 drop Left Eye Q6H   Continuous Infusions:   PRN Meds:.albuterol, hydrALAZINE, ondansetron **OR** ondansetron (ZOFRAN) IV, sodium chloride   Anti-infectives (From admission, onward)    None       Subjective: Last bowel movement noted to be yesterday.  She is still feeling sleepy during the day.  Objective: Vitals:   08/02/21 1600 08/02/21 1605 08/02/21 1700 08/02/21 1800  BP:  (!) 157/79 (!) 170/89 (!) 164/76  Pulse: 97 95 90 96  Resp: _0 Temp: 98.2 F (36.8 C)     TempSrc: Oral     SpO2: 94% 100% 100% 97%  Weight:      Height:        Intake/Output Summary (Last 24 hours) at 08/02/2021 1934 Last data filed at 08/02/2021 1300 Gross per 24 hour  Intake 480 ml  Output --  Net 480 ml   Filed Weights   07/30/21 1215 07/30/21 2049 08/02/21 1532  Weight: (!) 151.6 kg (!) 150.5 kg (!) 153.9 kg    Physical Exam  General exam: Alert, awake, oriented x 3 Respiratory system: Clear to auscultation. Respiratory effort normal. Cardiovascular system:RRR. No murmurs, rubs, gallops. Gastrointestinal system: Abdomen is nondistended, soft and nontender. No organomegaly or masses felt. Normal bowel sounds heard. Central nervous system: Alert and oriented. No focal neurological deficits.  She was chronic left facial droop Extremities: No C/C/E, +pedal pulses Skin: No rashes, lesions or  ulcers Psychiatry: Judgement and insight appear normal. Mood & affect appropriate.    Data Reviewed: I have personally reviewed following labs and imaging studies  CBC: Recent Labs  Lab 07/30/21 1300 07/30/21 1913 07/31/21 0511 08/01/21 0547  WBC 10.9* 11.6* 12.6* 8.9  NEUTROABS 8.1*  --   --   --   HGB 12.2 12.6 11.4* 11.8*  HCT 44.8 46.8* 43.2 46.3*  MCV 77.5* 77.7* 79.1* 80.9  PLT 263 279 264 982   Basic Metabolic Panel: Recent Labs  Lab 07/27/21 1407 07/30/21 1300 07/30/21 1913 07/31/21 0511 08/01/21 0547 08/02/21 0529  NA 141 140  --  146* 143 140  K 4.7 4.1  --  4.3 4.9 4.2  CL 95* 100  --  103 106 102  CO2  25 32  --  33* 28 31  GLUCOSE 112* 119*  --  96 87 73  BUN 71* 77*  --  61* 56* 51*  CREATININE 2.66* 2.07* 1.92* 1.61* 1.75* 1.77*  CALCIUM 9.4 8.8*  --  9.0 8.3* 8.2*  MG 2.0  --   --   --   --   --    GFR: Estimated Creatinine Clearance: 58.1 mL/min (A) (by C-G formula based on SCr of 1.77 mg/dL (H)). Liver Function Tests: Recent Labs  Lab 07/30/21 1300 07/31/21 0511 08/01/21 0547 08/02/21 0529  AST 607* 417* 217* 97*  ALT 698* 606* 469* 303*  ALKPHOS 112 94 96 79  BILITOT 3.0* 2.2* 1.9* 1.4*  PROT 7.6 6.9 6.9 6.3*  ALBUMIN 3.7 3.2* 3.0* 2.9*   Cardiac Enzymes: Recent Labs  Lab 07/31/21 0511  CKTOTAL 97   BNP (last 3 results) No results for input(s): "PROBNP" in the last 8760 hours. HbA1C: No results for input(s): "HGBA1C" in the last 72 hours. Sepsis Labs: _0 (procalcitonin:4,lacticidven:4) ) Recent Results (from the past 240 hour(s))  C Difficile Quick Screen w PCR reflex     Status: None   Collection Time: 07/31/21  6:01 PM  Result Value Ref Range Status   C Diff antigen NEGATIVE NEGATIVE Final   C Diff toxin NEGATIVE NEGATIVE Final   C Diff interpretation No C. difficile detected.  Final    Comment: Performed at [facility], [address] [place], [place] [address]  Gastrointestinal Panel by PCR , Stool     Status:  None   Collection Time: 07/31/21  6:02 PM   Specimen: STOOL  Result Value Ref Range Status   Campylobacter species NOT DETECTED NOT DETECTED Final   Plesimonas shigelloides NOT DETECTED NOT DETECTED Final   Salmonella species NOT DETECTED NOT DETECTED Final   Yersinia enterocolitica NOT DETECTED NOT DETECTED Final   Vibrio species NOT DETECTED NOT DETECTED Final   Vibrio cholerae NOT DETECTED NOT DETECTED Final   Enteroaggregative E coli (EAEC) NOT DETECTED NOT DETECTED Final   Enteropathogenic E coli (EPEC) NOT DETECTED NOT DETECTED Final   Enterotoxigenic E coli (ETEC) NOT DETECTED NOT DETECTED Final   Shiga like toxin producing E coli (STEC) NOT DETECTED NOT DETECTED Final   Shigella/Enteroinvasive E coli (EIEC) NOT DETECTED NOT DETECTED Final   Cryptosporidium NOT DETECTED NOT DETECTED Final   Cyclospora cayetanensis NOT DETECTED NOT DETECTED Final   Entamoeba histolytica NOT DETECTED NOT DETECTED Final   Giardia lamblia NOT DETECTED NOT DETECTED Final   Adenovirus F40/41 NOT DETECTED NOT DETECTED Final   Astrovirus NOT DETECTED NOT DETECTED Final   Norovirus GI/GII NOT DETECTED NOT DETECTED Final   Rotavirus A NOT DETECTED NOT DETECTED Final   Sapovirus (I, II, IV, and V) NOT DETECTED NOT DETECTED Final    Comment: Performed at [facility], [address] [place], [place] [address]  MRSA Next Gen by PCR, Nasal     Status: None   Collection Time: 08/02/21  3:20 PM   Specimen: Nasal Mucosa; Nasal Swab  Result Value Ref Range Status   MRSA by PCR Next Gen NOT DETECTED NOT DETECTED Final    Comment: (NOTE) The GeneXpert MRSA Assay (FDA approved for NASAL specimens only), is one component of a comprehensive MRSA colonization surveillance program. It is not intended to diagnose MRSA infection nor to guide or monitor treatment for MRSA infections. Test performance is not FDA approved in patients less than 37 years old. Performed  at [facility], [address],  [place], [place] [address]     Radiology Studies: DG CHEST PORT 1 VIEW  Result Date: 08/01/2021 CLINICAL DATA:  Shortness of breath this afternoon. EXAM: PORTABLE CHEST 1 VIEW COMPARISON:  AP chest 07/30/2021; chest two views 06/07/2021 in 12/22/2020 FINDINGS: VP shunt catheter tubing again overlies the right hemithorax. Cardiac silhouette is again mildly enlarged. Mediastinal contours are within normal limits. No definite pleural effusion. Improved lung volumes. Mild bilateral interstitial thickening is similar to prior. No pneumothorax is seen. No acute skeletal abnormality. IMPRESSION: No significant change from prior. Mild bilateral interstitial thickening may represent mild interstitial pulmonary edema. Electronically Signed   By: Yvonne Kendall M.D.   On: 08/01/2021 14:57     Scheduled Meds:  allopurinol  100 mg Oral Daily   Chlorhexidine Gluconate Cloth  6 each Topical Daily   ferrous sulfate  325 mg Oral Daily   heparin  5,000 Units Subcutaneous Q8H   insulin aspart  0-9 Units Subcutaneous TID WC   tobramycin  2 drop Left Eye Q6H   Continuous Infusions:     LOS: 3 days    Kathie Dike M.D on 08/02/2021 at 7:34 PM  Go to [email or web address] - for contact info  [facility] - Office  [phone]  If 7PM-7AM, please contact night-coverage [email or web address]  08/02/2021, 7:34 PM

## 2021-08-03 ENCOUNTER — Inpatient Hospital Stay (HOSPITAL_COMMUNITY): Payer: 59

## 2021-08-03 DIAGNOSIS — N1831 Chronic kidney disease, stage 3a: Secondary | ICD-10-CM | POA: Diagnosis not present

## 2021-08-03 DIAGNOSIS — G4730 Sleep apnea, unspecified: Secondary | ICD-10-CM

## 2021-08-03 DIAGNOSIS — G9340 Encephalopathy, unspecified: Secondary | ICD-10-CM | POA: Diagnosis not present

## 2021-08-03 DIAGNOSIS — N179 Acute kidney failure, unspecified: Secondary | ICD-10-CM | POA: Diagnosis not present

## 2021-08-03 LAB — BASIC METABOLIC PANEL
Anion gap: 5 (ref 5–15)
BUN: 36 mg/dL — ABNORMAL HIGH (ref 6–20)
CO2: 32 mmol/L (ref 22–32)
Calcium: 8.4 mg/dL — ABNORMAL LOW (ref 8.9–10.3)
Chloride: 106 mmol/L (ref 98–111)
Creatinine, Ser: 1.25 mg/dL — ABNORMAL HIGH (ref 0.44–1.00)
GFR, Estimated: 53 mL/min — ABNORMAL LOW (ref >60)
Glucose, Bld: 80 mg/dL (ref 70–99)
Potassium: 3.9 mmol/L (ref 3.5–5.1)
Sodium: 143 mmol/L (ref 135–145)

## 2021-08-03 LAB — GLUCOSE, CAPILLARY
Glucose-Capillary: 117 mg/dL — ABNORMAL HIGH (ref 70–99)
Glucose-Capillary: 80 mg/dL (ref 70–99)
Glucose-Capillary: 100 mg/dL — ABNORMAL HIGH (ref 70–99)
Glucose-Capillary: 89 mg/dL (ref 70–99)

## 2021-08-03 LAB — HEPATIC FUNCTION PANEL
ALT: 211 U/L — ABNORMAL HIGH (ref 0–44)
AST: 50 U/L — ABNORMAL HIGH (ref 15–41)
Albumin: 2.7 g/dL — ABNORMAL LOW (ref 3.5–5.0)
Alkaline Phosphatase: 66 U/L (ref 38–126)
Bilirubin, Direct: 0.6 mg/dL — ABNORMAL HIGH (ref 0.0–0.2)
Indirect Bilirubin: 0.9 mg/dL (ref 0.3–0.9)
Total Bilirubin: 1.5 mg/dL — ABNORMAL HIGH (ref 0.3–1.2)
Total Protein: 6.1 g/dL — ABNORMAL LOW (ref 6.5–8.1)

## 2021-08-03 LAB — BLOOD GAS, ARTERIAL
Acid-Base Excess: 12.5 mmol/L — ABNORMAL HIGH (ref 0.0–2.0)
Bicarbonate: 38.9 mmol/L — ABNORMAL HIGH (ref 20.0–28.0)
Drawn by: 22223
FIO2: 30 %
O2 Saturation: 99.5 %
Patient temperature: 37
pCO2 arterial: 56 mmHg — ABNORMAL HIGH (ref 32–48)
pH, Arterial: 7.45 (ref 7.35–7.45)
pO2, Arterial: 93 mmHg (ref 83–108)

## 2021-08-03 MED ORDER — FUROSEMIDE 40 MG PO TABS
40.0000 mg | ORAL_TABLET | Freq: Every day | ORAL | Status: DC
  Administered 2021-08-03 – 2021-08-05 (×3): 40 mg via ORAL
  Filled 2021-08-03 (×3): qty 1

## 2021-08-03 MED ORDER — LOPERAMIDE HCL 2 MG PO CAPS
2.0000 mg | ORAL_CAPSULE | Freq: Once | ORAL | Status: AC
  Administered 2021-08-03: 2 mg via ORAL
  Filled 2021-08-03: qty 1

## 2021-08-03 MED ORDER — CARVEDILOL 3.125 MG PO TABS
6.2500 mg | ORAL_TABLET | Freq: Two times a day (BID) | ORAL | Status: DC
  Administered 2021-08-03 – 2021-08-05 (×4): 6.25 mg via ORAL
  Filled 2021-08-03 (×4): qty 2

## 2021-08-03 MED ORDER — DIPHENHYDRAMINE HCL 50 MG/ML IJ SOLN
25.0000 mg | Freq: Once | INTRAMUSCULAR | Status: AC
  Administered 2021-08-04: 25 mg via INTRAVENOUS
  Filled 2021-08-03: qty 1

## 2021-08-03 MED ORDER — METOCLOPRAMIDE HCL 5 MG/ML IJ SOLN
10.0000 mg | Freq: Once | INTRAMUSCULAR | Status: AC
  Administered 2021-08-04: 10 mg via INTRAVENOUS
  Filled 2021-08-03: qty 2

## 2021-08-03 NOTE — Progress Notes (Addendum)
PROGRESS NOTE     Christina Davenport, is a 51 y.o. female, DOB - 06-Sep-1970, XIP:[id number]  Admit date - 07/30/2021   Admitting Physician Evalee Mutton Kristeen Mans, MD  Outpatient Primary MD for the patient is Fayrene Helper, MD  LOS - 4  Chief Complaint  Patient presents with   Altered Mental Status        Brief Narrative:  51 y.o. 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 admitted on 07/30/2021 with acute metabolic encephalopathy in the setting of AKI and elevated LFTs and diarrhea    -Assessment and Plan: 1)Acute elevation of LFTs--- GI input appreciated -Right upper quadrant ultrasound without acute findings -Viral hepatitis panel negative -CK and TSH WNL -Denies EtOH use -Suspect this may be related to hypovolemia/ischemia -Overall LFTs are trending down    Latest Ref Rng & Units 08/03/2021    4:15 AM 08/02/2021    5:29 AM 08/01/2021    5:47 AM  Hepatic Function  Total Protein 6.5 - 8.1 g/dL 6.1  6.3  6.9   Albumin 3.5 - 5.0 g/dL 2.7  2.9  3.0   AST 15 - 41 U/L 50  97  217   ALT 0 - 44 U/L 211  303  469   Alk Phosphatase 38 - 126 U/L 66  79  96   Total Bilirubin 0.3 - 1.2 mg/dL 1.5  1.4  1.9   Bilirubin, Direct 0.0 - 0.2 mg/dL 0.6  0.8      2)AKI----acute kidney injury on CKD 3b- worsening renal function due to dehydration in the setting of recent diarrhea -Creatinine was 2.66 on 07/27/2021 from a baseline usually around 1.5 -Creatinine currently improving with hydration, noted to be 1.2 today - renally adjust medications, avoid nephrotoxic agents / dehydration  / hypotension  3) acute metabolic cephalopathy--- due to #1 #2 above -Mental status has appeared to be slowly improving -Although she is somnolent, she wakes up to voice and answers questions appropriately -ABG shows hypercapnia which appears to be chronic -Started on BiPAP, pulmonology  following -Follow-up ABG in a.m. appears significantly improved, pCO2 is down and pH is better -We will ask TOC to help set up NIV device at home until she can have her sleep study performed as an outpatient  4)Left eye conjunctivitis--patient's left eye typically does not close since her last surgery so she has to use artificial tears eyedrops from time to time-suspect superimposed infection - please see photos in epic -Tobramycin as ordered -Consider outpatient ophthalmology consult if fails to improve  5)Diarrhea--- leukocytosis was present, now resolved. Stool for C. difficile and stool pathogen negative -Overall stool frequency appears to have improved  6)HTN -Blood pressure is trending up, mildly tachycardic -Restart home dose of Coreg -Restart Lasix  7)Morbid Obesity-/presumed OSA/chronic respiratory acidosis -Low calorie diet, portion control and increase physical activity discussed with patient -Body mass index is 56.46 kg/m. 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 Garwin Brothers -We will check pelvic ultrasound, if results are equivocal, may need to consider CT -Plans will be to follow-up with her gynecologist for further management   Disposition/Need for in-Hospital Stay- patient unable to be discharged at this time due to diarrhea with AKI and elevated LFTs requiring further investigations and  work-up  Status is: Inpatient   Disposition: The patient is from: Home              Anticipated d/c is to: Home              Anticipated d/c date is: 2 days              Patient currently is not medically stable to d/c. Barriers: Not Clinically Stable-   Code Status :  -  Code Status: Full Code   Family Communication:    (patient is alert, awake and coherent)  Discussed with patient's mother at bedside  DVT Prophylaxis  :   - SCDs   heparin injection 5,000 Units Start: 07/30/21 2200   Lab Results  Component Value Date   PLT 227 08/01/2021    Inpatient Medications  Scheduled Meds:  allopurinol  100 mg Oral Daily   carvedilol  6.25 mg Oral BID WC   Chlorhexidine Gluconate Cloth  6 each Topical Daily   ferrous sulfate  325 mg Oral Daily   furosemide  40 mg Oral Daily   heparin  5,000 Units Subcutaneous Q8H   insulin aspart  0-9 Units Subcutaneous TID WC   tobramycin  2 drop Left Eye Q6H   Continuous Infusions:   PRN Meds:.albuterol, hydrALAZINE, ondansetron **OR** ondansetron (ZOFRAN) IV, sodium chloride   Anti-infectives (From admission, onward)    None       Subjective: She is feeling better today.  She slept reasonably well with BiPAP mask on.  No diarrhea.  She does feel weak.  Objective: Vitals:   08/03/21 0500 08/03/21 0600 08/03/21 0700 08/03/21 0800  BP: (!) 151/78 (!) 173/80 (!) 170/67   Pulse: 93 95 92 (!) 102  Resp: _0 Temp:   98.5 F (36.9 C)   TempSrc:   Oral   SpO2: 100% 91% 100% (!) 85%  Weight: (!) 153.9 kg     Height:        Intake/Output Summary (Last 24 hours) at 08/03/2021 1042 Last data filed at 08/03/2021 0740 Gross per 24 hour  Intake 240 ml  Output 1200 ml  Net -960 ml   Filed Weights   07/30/21 2049 08/02/21 1532 08/03/21 0500  Weight: (!) 150.5 kg (!) 153.9 kg (!) 153.9 kg    Physical Exam  General exam: Alert, awake, oriented x 3 Respiratory system: Clear to auscultation. Respiratory effort  normal. Cardiovascular system:RRR. No murmurs, rubs, gallops. Gastrointestinal system: Abdomen is nondistended, soft and nontender. No organomegaly or masses felt. Normal bowel sounds heard. Central nervous system: Alert and oriented. No focal neurological deficits.  She has chronic left facial droop Extremities: No C/C/E, +pedal pulses Skin: No rashes, lesions or ulcers Psychiatry: Judgement and insight appear normal. Mood & affect appropriate.    Data Reviewed: I have personally reviewed following labs and imaging studies  CBC: Recent Labs  Lab 07/30/21 1300 07/30/21 1913 07/31/21 0511 08/01/21 0547  WBC 10.9* 11.6* 12.6* 8.9  NEUTROABS 8.1*  --   --   --   HGB 12.2 12.6 11.4* 11.8*  HCT 44.8 46.8* 43.2 46.3*  MCV 77.5* 77.7*  79.1* 80.9  PLT 263 279 264 485   Basic Metabolic Panel: Recent Labs  Lab 07/27/21 1407 07/30/21 1300 07/30/21 1913 07/31/21 0511 08/01/21 0547 08/02/21 0529 08/03/21 0415  NA 141 140  --  146* 143 140 143  K 4.7 4.1  --  4.3 4.9 4.2 3.9  CL 95* 100  --  103 106 102 106  CO2 25 32  --  33* 28 31 32  GLUCOSE 112* 119*  --  96 87 73 80  BUN 71* 77*  --  61* 56* 51* 36*  CREATININE 2.66* 2.07* 1.92* 1.61* 1.75* 1.77* 1.25*  CALCIUM 9.4 8.8*  --  9.0 8.3* 8.2* 8.4*  MG 2.0  --   --   --   --   --   --    GFR: Estimated Creatinine Clearance: 82.3 mL/min (A) (by C-G formula based on SCr of 1.25 mg/dL (H)). Liver Function Tests: Recent Labs  Lab 07/30/21 1300 07/31/21 0511 08/01/21 0547 08/02/21 0529 08/03/21 0415  AST 607* 417* 217* 97* 50*  ALT 698* 606* 469* 303* 211*  ALKPHOS 112 94 96 79 66  BILITOT 3.0* 2.2* 1.9* 1.4* 1.5*  PROT 7.6 6.9 6.9 6.3* 6.1*  ALBUMIN 3.7 3.2* 3.0* 2.9* 2.7*   Cardiac Enzymes: Recent Labs  Lab 07/31/21 0511  CKTOTAL 97   BNP (last 3 results) No results for input(s): "PROBNP" in the last 8760 hours. HbA1C: No results for input(s): "HGBA1C" in the last 72 hours. Sepsis  Labs: _0 (procalcitonin:4,lacticidven:4) ) Recent Results (from the past 240 hour(s))  C Difficile Quick Screen w PCR reflex     Status: None   Collection Time: 07/31/21  6:01 PM  Result Value Ref Range Status   C Diff antigen NEGATIVE NEGATIVE Final   C Diff toxin NEGATIVE NEGATIVE Final   C Diff interpretation No C. difficile detected.  Final    Comment: Performed at [facility], [address], [place], [place] [address]  Gastrointestinal Panel by PCR , Stool     Status: None   Collection Time: 07/31/21  6:02 PM   Specimen: STOOL  Result Value Ref Range Status   Campylobacter species NOT DETECTED NOT DETECTED Final   Plesimonas shigelloides NOT DETECTED NOT DETECTED Final   Salmonella species NOT DETECTED NOT DETECTED Final   Yersinia enterocolitica NOT DETECTED NOT DETECTED Final   Vibrio species NOT DETECTED NOT DETECTED Final   Vibrio cholerae NOT DETECTED NOT DETECTED Final   Enteroaggregative E coli (EAEC) NOT DETECTED NOT DETECTED Final   Enteropathogenic E coli (EPEC) NOT DETECTED NOT DETECTED Final   Enterotoxigenic E coli (ETEC) NOT DETECTED NOT DETECTED Final   Shiga like toxin producing E coli (STEC) NOT DETECTED NOT DETECTED Final   Shigella/Enteroinvasive E coli (EIEC) NOT DETECTED NOT DETECTED Final   Cryptosporidium NOT DETECTED NOT DETECTED Final   Cyclospora cayetanensis NOT DETECTED NOT DETECTED Final   Entamoeba histolytica NOT DETECTED NOT DETECTED Final   Giardia lamblia NOT DETECTED NOT DETECTED Final   Adenovirus F40/41 NOT DETECTED NOT DETECTED Final   Astrovirus NOT DETECTED NOT DETECTED Final   Norovirus GI/GII NOT DETECTED NOT DETECTED Final   Rotavirus A NOT DETECTED NOT DETECTED Final   Sapovirus (I, II, IV, and V) NOT DETECTED NOT DETECTED Final    Comment: Performed at [facility], [address], [place], [place] [address]  MRSA Next Gen by PCR, Nasal     Status: None   Collection Time: 08/02/21  3:20  PM   Specimen: Nasal  Mucosa; Nasal Swab  Result Value Ref Range Status   MRSA by PCR Next Gen NOT DETECTED NOT DETECTED Final    Comment: (NOTE) The GeneXpert MRSA Assay (FDA approved for NASAL specimens only), is one component of a comprehensive MRSA colonization surveillance program. It is not intended to diagnose MRSA infection nor to guide or monitor treatment for MRSA infections. Test performance is not FDA approved in patients less than 14 years old. Performed at [facility], [address] [place], [place] [address]     Radiology Studies: DG CHEST PORT 1 VIEW  Result Date: 08/01/2021 CLINICAL DATA:  Shortness of breath this afternoon. EXAM: PORTABLE CHEST 1 VIEW COMPARISON:  AP chest 07/30/2021; chest two views 06/07/2021 in 12/22/2020 FINDINGS: VP shunt catheter tubing again overlies the right hemithorax. Cardiac silhouette is again mildly enlarged. Mediastinal contours are within normal limits. No definite pleural effusion. Improved lung volumes. Mild bilateral interstitial thickening is similar to prior. No pneumothorax is seen. No acute skeletal abnormality. IMPRESSION: No significant change from prior. Mild bilateral interstitial thickening may represent mild interstitial pulmonary edema. Electronically Signed   By: Yvonne Kendall M.D.   On: 08/01/2021 14:57     Scheduled Meds:  allopurinol  100 mg Oral Daily   carvedilol  6.25 mg Oral BID WC   Chlorhexidine Gluconate Cloth  6 each Topical Daily   ferrous sulfate  325 mg Oral Daily   furosemide  40 mg Oral Daily   heparin  5,000 Units Subcutaneous Q8H   insulin aspart  0-9 Units Subcutaneous TID WC   tobramycin  2 drop Left Eye Q6H   Continuous Infusions:     LOS: 4 days    Kathie Dike M.D on 08/03/2021 at 10:42 AM  Go to [email or web address] - for contact info  [facility] - Office  [phone]  If 7PM-7AM, please contact night-coverage [email or web address]  08/03/2021, 10:42 AM

## 2021-08-03 NOTE — Progress Notes (Signed)
Pt was off the unit in radiology when I came by.  Spoke with her mother.  She reports that pt did well with Bipap overnight and was much more alert.  Case manager working on Designer, industrial/product up.  Hopefully she can get this set up, and then get outpt sleep study done to further adjust her therapy.  Will see about getting sleep study schedule at [facility] sleep lab instead of [facility].  Will arrange for outpt pulmonary follow up in [place] office.    Please call if additional help needed while she is in hospital.  Chesley Mires, MD [place] Pager - [phone] 08/03/2021, 1:50 PM

## 2021-08-03 NOTE — Evaluation (Signed)
Physical Therapy Evaluation Patient Details Name: Christina Davenport MRN: [id number] DOB: Aug 15, 1970 Today's Date: 08/03/2021  History of Present Illness  Christina Davenport is a 51 y.o. 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-who was found by mother confused-and subsequently brought to the hospital for further evaluation and treatment.     Per patient's mother-patient has had intermittent confusion for the past several weeks-however today when the mother went to the patient's house-she had persistent confusion-and had some visual hallucinations (seeing a man in the house).  Per patient's mother-patient works for [employer] had not logged in for work (she normally [place] at [employer] AM)-and was having trouble accessing her phone.  She was brought to the ED where she was found to have AKI and transaminitis-and subsequently [facility] was consulted for admission.     Per patient-she is on multiple diuretics and antihypertensive medications-she was recently started on Aldactone-however she has not had a chance to fill the prescription yet.  She has no history of vomiting or diarrhea although she has had a very poor appetite for the past several days.  She does acknowledge getting lightheaded/dizzy upon standing for the past several days.     During my evaluation-patient was completely awake and alert.  She denied any headache, chest pain, nausea, vomiting, diarrhea, abdominal pain.   Clinical Impression  Patient functioning near baseline for functional mobility and gait other than c/o minor dizziness with SpO2 dropping from 95% to 85% during ambulation on room air, otherwise demonstrates good return for ambulation in room and hallways without loss of balance.  Plan:  Patient discharged from physical therapy to care of nursing for ambulation daily as tolerated for length of stay.           Recommendations for follow up therapy are one component of a multi-disciplinary discharge planning process, led by the attending physician.  Recommendations may be updated based on patient status, additional functional criteria and insurance authorization.  Follow Up Recommendations No PT follow up    Assistance Recommended at Discharge PRN  Patient can return home with the following  A little help with walking and/or transfers;A little help with bathing/dressing/bathroom;Assistance with cooking/housework;Help with stairs or ramp for entrance    Equipment Recommendations None recommended by PT  Recommendations for Other Services       Functional Status Assessment Patient has had a recent decline in their functional status and/or demonstrates limited ability to make significant improvements in function in a reasonable and predictable amount of time     Precautions / Restrictions Precautions Precautions: None Restrictions Weight Bearing Restrictions: No      Mobility  Bed Mobility Overal bed mobility: Modified Independent                  Transfers Overall transfer level: Modified independent                      Ambulation/Gait Ambulation/Gait assistance: Modified independent (Device/Increase time) Gait Distance (Feet): 100 Feet Assistive device: None Gait Pattern/deviations: WFL(Within Functional Limits) Gait velocity: slightly decreased     General Gait Details: grossly WFL with slightly labored cadence without loss of balance, on room air wth SpO2 dropping from 95% to 85%, limited mostly due to c/o mild dizziness  Stairs            Wheelchair Mobility    Modified Rankin (  Stroke Patients Only)       Balance Overall balance assessment: No apparent balance deficits (not formally assessed)                                           Pertinent Vitals/Pain Pain Assessment Pain Assessment: No/denies pain    Home Living  Family/patient expects to be discharged to:: Private residence Living Arrangements: Alone Available Help at Discharge: Family;Available 24 hours/day Type of Home: House Home Access: Stairs to enter Entrance Stairs-Rails: Right;Left;Can reach both Entrance Stairs-Number of Steps: 7   Home Layout: One level Home Equipment: Conservation officer, nature (2 wheels);Cane - single point;Grab bars - tub/shower Additional Comments: patient states she has access to a Shower seat and BSC if needed    Prior Function Prior Level of Function : Independent/Modified Independent             Mobility Comments: Hydrographic surveyor, drives ADLs Comments: Independent     Hand Dominance        Extremity/Trunk Assessment   Upper Extremity Assessment Upper Extremity Assessment: Overall WFL for tasks assessed    Lower Extremity Assessment Lower Extremity Assessment: Overall WFL for tasks assessed    Cervical / Trunk Assessment Cervical / Trunk Assessment: Normal  Communication   Communication: No difficulties  Cognition Arousal/Alertness: Awake/alert Behavior During Therapy: WFL for tasks assessed/performed Overall Cognitive Status: Within Functional Limits for tasks assessed                                          General Comments      Exercises     Assessment/Plan    PT Assessment Patient does not need any further PT services  PT Problem List         PT Treatment Interventions      PT Goals (Current goals can be found in the Care Plan section)  Acute Rehab PT Goals Patient Stated Goal: return home with family to assist PT Goal Formulation: With patient Time For Goal Achievement: 08/03/21 Potential to Achieve Goals: Good    Frequency       Co-evaluation               AM-PAC PT "6 Clicks" Mobility  Outcome Measure Help needed turning from your back to your side while in a flat bed without using bedrails?: None Help needed moving from lying on your back  to sitting on the side of a flat bed without using bedrails?: None Help needed moving to and from a bed to a chair (including a wheelchair)?: None Help needed standing up from a chair using your arms (e.g., wheelchair or bedside chair)?: None Help needed to walk in hospital room?: A Little Help needed climbing 3-5 steps with a railing? : A Little 6 Click Score: 22    End of Session   Activity Tolerance: Patient tolerated treatment well;Patient limited by fatigue Patient left: in chair;with call bell/phone within reach Nurse Communication: Mobility status PT Visit Diagnosis: Unsteadiness on feet (R26.81);Other abnormalities of gait and mobility (R26.89);Muscle weakness (generalized) (M62.81)    Time: 1350-1415 PT Time Calculation (min) (ACUTE ONLY): 25 min   Charges:   PT Evaluation $PT Eval Moderate Complexity: 1 Mod PT Treatments $Therapeutic Activity: 23-37 mins        2:59  PM, 08/03/21 Lonell Grandchild, MPT Physical Therapist with [facility] 336 [phone] office [phone] mobile phone

## 2021-08-03 NOTE — Progress Notes (Signed)
Gastroenterology Progress Note   Referring Provider: No ref. provider found Primary Care Physician:  Fayrene Helper, MD Primary Gastroenterologist:  Dr. Abbey Chatters  Patient ID: Loman Brooklyn; [id number]; 23-Mar-1970    Subjective   Patient reports feeling much better today.  She states that she sat up in the chair and was walked around the unit multiple times and participated in hygiene.  Tolerating diet without issue.  Denies abdominal pain, nausea, or vomiting.  Denies any confusion or hallucinations.   Objective   Vital signs in last 24 hours Temp:  [97.7 F (36.5 C)-99 F (37.2 C)] 98.5 F (36.9 C) (06/09 0700) Pulse Rate:  [89-113] 102 (06/09 0800) Resp:  [10-20] 20 (06/09 0800) BP: (119-173)/(62-94) 170/67 (06/09 0700) SpO2:  [85 %-100 %] 85 % (06/09 0800) FiO2 (%):  [40 %] 40 % (06/08 2230) Weight:  [153.9 kg] 153.9 kg (06/09 0500) Last BM Date : 08/02/21  Physical Exam General:   Alert and oriented, pleasant Head:  Normocephalic and atraumatic. Eyes:  No icterus, sclera red to left eye Neck:  Supple, without thyromegaly or masses.  Heart:  S1, S2 present, no murmurs noted.  Lungs: Clear to auscultation bilaterally, without wheezing, rales, or rhonchi.  Abdomen:  Bowel sounds present, soft, non-tender, non-distended. No HSM or hernias noted. No rebound or guarding. No masses appreciated  Pulses:  Normal pulses noted. Extremities:  Without clubbing or edema. Neurologic:  Alert and  oriented x4;  grossly normal neurologically. Skin:  Warm and dry, intact without significant lesions.  Psych:  Alert and cooperative. Normal mood and affect.  Intake/Output from previous day: 06/08 0701 - 06/09 0700 In: 480 [P.O.:480] Out: -  Intake/Output this shift: Total I/O In: -  Out: 1200 [Urine:1200]  Lab Results  Recent Labs    08/01/21 0547  WBC 8.9  HGB 11.8*  HCT 46.3*  PLT 227   BMET Recent Labs    08/01/21 0547 08/02/21 0529 08/03/21 0415  NA 143  140 143  K 4.9 4.2 3.9  CL 106 102 106  CO2 28 31 32  GLUCOSE 87 73 80  BUN 56* 51* 36*  CREATININE 1.75* 1.77* 1.25*  CALCIUM 8.3* 8.2* 8.4*   LFT Recent Labs    08/01/21 0547 08/02/21 0529 08/03/21 0415  PROT 6.9 6.3* 6.1*  ALBUMIN 3.0* 2.9* 2.7*  AST 217* 97* 50*  ALT 469* 303* 211*  ALKPHOS 96 79 66  BILITOT 1.9* 1.4* 1.5*  BILIDIR  --  0.8* 0.6*  IBILI  --  0.6 0.9   PT/INR Recent Labs    07/31/21 2107 08/02/21 0529  LABPROT 17.4* 15.6*  INR 1.4* 1.3*   Hepatitis Panel No results for input(s): "HEPBSAG", "HCVAB", "HEPAIGM", "HEPBIGM" in the last 72 hours.   Studies/Results DG CHEST PORT 1 VIEW  Result Date: 08/01/2021 CLINICAL DATA:  Shortness of breath this afternoon. EXAM: PORTABLE CHEST 1 VIEW COMPARISON:  AP chest 07/30/2021; chest two views 06/07/2021 in 12/22/2020 FINDINGS: VP shunt catheter tubing again overlies the right hemithorax. Cardiac silhouette is again mildly enlarged. Mediastinal contours are within normal limits. No definite pleural effusion. Improved lung volumes. Mild bilateral interstitial thickening is similar to prior. No pneumothorax is seen. No acute skeletal abnormality. IMPRESSION: No significant change from prior. Mild bilateral interstitial thickening may represent mild interstitial pulmonary edema. Electronically Signed   By: Yvonne Kendall M.D.   On: 08/01/2021 14:57   US RENAL  Result Date: 07/31/2021 CLINICAL DATA:  Acute kidney injury. EXAM:  RENAL / URINARY TRACT ULTRASOUND COMPLETE COMPARISON:  Renal sonogram dated Jul 04, 2021 FINDINGS: Right Kidney: Renal measurements: 10.5 x 5.4 x 5.3 = volume: 156 mL. Echogenicity within normal limits. No mass or hydronephrosis visualized. Left Kidney: Renal measurements: 9.7 x 5.3 x 4.8 = volume: 130 mL. Echogenicity within normal limits. No mass or hydronephrosis visualized. Bladder: Appears normal for degree of bladder distention. Other: Complex avascular masslike structure abutting the superior  aspect of the uterus measuring at least 7.4 x 5.4 x 8.2 cm. IMPRESSION: 1. evidence of nephrolithiasis or hydronephrosis. Limited evaluation due to bowel gas shadowing. 2. Complex avascular mass abutting the superior aspect of the uterus measuring at least 7.4 x 5.4 x 8.2 cm, this may be uterine or adnexal region, further evaluation with pelvic sonogram would be helpful. Electronically Signed   By: Keane Police D.O.   On: 07/31/2021 11:09   US Abdomen Limited  Result Date: 07/30/2021 CLINICAL DATA:  Abnormal LFTs EXAM: ULTRASOUND ABDOMEN LIMITED RIGHT UPPER QUADRANT COMPARISON:  None Available. FINDINGS: Gallbladder: Filled with echogenic shadowing calculi. Appears to be wall thickening with no significant pericholecystic fluid. Negative sonographic Murphy sign. Common bile duct: Diameter: 3 mm Liver: No focal lesion identified. Within normal limits in parenchymal echogenicity. Portal vein is patent on color Doppler imaging with normal direction of blood flow towards the liver. Other: None. IMPRESSION: Cholelithiasis. Electronically Signed   By: Ofilia Neas M.D.   On: 07/30/2021 16:21   CT Head Wo Contrast  Result Date: 07/30/2021 CLINICAL DATA:  Mental status changes, unknown cause. EXAM: CT HEAD WITHOUT CONTRAST TECHNIQUE: Contiguous axial images were obtained from the base of the skull through the vertex without intravenous contrast. RADIATION DOSE REDUCTION: This exam was performed according to the departmental dose-optimization program which includes automated exposure control, adjustment of the mA and/or kV according to patient size and/or use of iterative reconstruction technique. COMPARISON:  None Available. FINDINGS: Brain: Right frontal ventriculostomy catheter terminates in the ventricles. Ventricle size is normal. Midline suboccipital craniotomy noted. Foramen magnum is decompressed. 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  No acute infarct, hemorrhage, or mass lesion is present. Ventricles are of normal size. No significant extra-axial fluid collection is present. Vascular: No hyperdense vessel or unexpected calcification. Skull: Metallic fragment noted in the posterior left neck, likely corresponding to remote trauma of the left occipital skull and mastoid. Metallic density present along the lateral floor of the middle cranial fossa. Sinuses/Orbits: The paranasal sinuses and mastoid air cells are clear. The globes and orbits are within normal limits. IMPRESSION: 1. Right frontal ventriculostomy catheter terminates in the ventricles. Ventricle size is normal. 2. 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 3. Metallic fragment in the posterior left neck, likely corresponding to remote trauma of the left occipital skull and mastoid. 4. Metallic density along the lateral floor of the middle cranial fossa. Electronically Signed   By: San Morelle M.D.   On: 07/30/2021 15:09   DG Chest Portable 1 View  Result Date: 07/30/2021 CLINICAL DATA:  Shortness of breath.  Mental status changes. EXAM: PORTABLE CHEST 1 VIEW COMPARISON:  02/25/2009 FINDINGS: Mildly degraded exam due to AP portable technique and patient body habitus. VP shunt catheter projects over the right chest. Apical lordotic positioning. Midline trachea. Cardiomegaly accentuated by AP portable technique. No right and no definite left pleural effusion. Breast tissue projects over the left lung base. No pneumothorax. Interstitial prominence is accentuated by  low lung volumes. No well-defined lobar consolidation. IMPRESSION: Decreased sensitivity and specificity exam due to technique related factors, as described above. Cardiomegaly and low lung volumes. Pulmonary interstitial thickening, accentuated by low lung volumes. Considerations include mild pulmonary venous congestion or the sequelae of asthma/chronic bronchitis  (patient is a never smoker). Electronically Signed   By: Abigail Miyamoto M.D.   On: 07/30/2021 13:22   ECHOCARDIOGRAM COMPLETE  Result Date: 07/27/2021    ECHOCARDIOGRAM REPORT   Patient Name:   JEROLENE KUPFER Date of Exam: 07/27/2021 Medical Rec #:  [id number]         Height:       65.0 in Accession #:    [id number]        Weight:       344.2 lb Date of Birth:  October 10, 1970        BSA:          2.492 m Patient Age:    49 years          BP:           141/81 mmHg Patient Gender: F                 HR:           85 bpm. Exam Location:  [facility] Procedure: 2D Echo, Cardiac Doppler and Color Doppler Indications:    R06.02 (ICD-10-CM) - Shortness of breath  History:        Patient has no prior history of Echocardiogram examinations.                 CHF; Risk Factors:Hypertension, Diabetes and Dyslipidemia. Hx of                 COVID-19, Morbid Obesity.  Sonographer:    Alvino Chapel RCS Referring Phys: [id number] [place]  1. Left ventricular ejection fraction, by estimation, is 55 to 60%. The left ventricle has normal function. The left ventricle has no regional wall motion abnormalities. There is mild left ventricular hypertrophy. Left ventricular diastolic parameters were normal.  2. Right ventricular systolic function is normal. The right ventricular size is normal. There is mildly elevated pulmonary artery systolic pressure. The estimated right ventricular systolic pressure is 96.2 mmHg.  3. Right atrial size was mildly dilated.  4. The mitral valve is normal in structure. Trivial mitral valve regurgitation.  5. The aortic valve is tricuspid. Aortic valve regurgitation is not visualized. No aortic stenosis is present.  6. The inferior vena cava is dilated in size with <50% respiratory variability, suggesting right atrial pressure of 15 mmHg. FINDINGS  Left Ventricle: Left ventricular ejection fraction, by estimation, is 55 to 60%. The left ventricle has normal function. The left ventricle has no  regional wall motion abnormalities. The left ventricular internal cavity size was normal in size. There is  mild left ventricular hypertrophy. Left ventricular diastolic parameters were normal. Right Ventricle: The right ventricular size is normal. No increase in right ventricular wall thickness. Right ventricular systolic function is normal. There is mildly elevated pulmonary artery systolic pressure. The tricuspid regurgitant velocity is 2.52  m/s, and with an assumed right atrial pressure of 15 mmHg, the estimated right ventricular systolic pressure is 95.2 mmHg. Left Atrium: Left atrial size was normal in size. Right Atrium: Right atrial size was mildly dilated. Pericardium: Trivial pericardial effusion is present. Mitral Valve: The mitral valve is normal in structure. Trivial mitral valve regurgitation. Tricuspid Valve: The tricuspid valve is  normal in structure. Tricuspid valve regurgitation is trivial. Aortic Valve: The aortic valve is tricuspid. Aortic valve regurgitation is not visualized. No aortic stenosis is present. Pulmonic Valve: The pulmonic valve was not well visualized. Pulmonic valve regurgitation is not visualized. Aorta: The aortic root is normal in size and structure. Venous: The inferior vena cava is dilated in size with less than 50% respiratory variability, suggesting right atrial pressure of 15 mmHg. IAS/Shunts: The interatrial septum was not well visualized.  LEFT VENTRICLE PLAX 2D LVIDd:         5.00 cm   Diastology LVIDs:         2.90 cm   LV e' medial:    8.70 cm/s LV PW:         1.10 cm   LV E/e' medial:  15.1 LV IVS:        1.10 cm   LV e' lateral:   11.30 cm/s LVOT diam:     2.00 cm   LV E/e' lateral: 11.6 LV SV:         76 LV SV Index:   31 LVOT Area:     3.14 cm  RIGHT VENTRICLE RV S prime:     14.70 cm/s TAPSE (M-mode): 1.9 cm LEFT ATRIUM             Index        RIGHT ATRIUM           Index LA diam:        4.30 cm 1.73 cm/m   RA Area:     24.30 cm LA Vol (A2C):   78.6 ml 31.55  ml/m  RA Volume:   79.20 ml  31.79 ml/m LA Vol (A4C):   48.8 ml 19.59 ml/m LA Biplane Vol: 61.6 ml 24.72 ml/m  AORTIC VALVE LVOT Vmax:   122.00 cm/s LVOT Vmean:  77.600 cm/s LVOT VTI:    0.242 m  AORTA Ao Root diam: 3.10 cm MITRAL VALVE                TRICUSPID VALVE MV Area (PHT): 4.39 cm     TR Peak grad:   25.4 mmHg MV Decel Time: 173 msec     TR Vmax:        252.00 cm/s MV E velocity: 131.00 cm/s MV A velocity: 108.00 cm/s  SHUNTS MV E/A ratio:  1.21         Systemic VTI:  0.24 m                             Systemic Diam: 2.00 cm Oswaldo Milian MD Electronically signed by Oswaldo Milian MD Signature Date/Time: 07/27/2021/5:31:21 PM    Final    US RENAL  Result Date: 07/05/2021 CLINICAL DATA:  Chronic renal disease EXAM: RENAL / URINARY TRACT ULTRASOUND COMPLETE COMPARISON:  None Available. FINDINGS: Right Kidney: Renal measurements: 9.0 x 4.9 x 4.9 cm = volume: 113 mL. Echogenicity within normal limits. No mass or hydronephrosis visualized. Left Kidney: Renal measurements: 9.7 x 5.3 x 3.9 cm = volume: 105 mL. Echogenicity within normal limits. No mass or hydronephrosis visualized. Bladder: Appears normal for degree of bladder distention. Other: None. IMPRESSION: No hydronephrosis. Electronically Signed   By: Lovey Newcomer M.D.   On: 07/05/2021 16:11    Assessment  51 y.o. female with a history of CKD stage IIIb, HFpEF, HTN, type 2 diabetes, HLD, history of brain tumor s/p craniotomy followed by  radiation with chronic left facial droop and VP shunt in place who was found by mother to be confused and was subsequently brought to the ED for further evaluation and treatment.  She is found of AKI and transaminitis.  GI consulted due to transaminitis  Transaminitis: Acutely elevated LFTs on admission, continuing to trend down.  Total bilirubin 1.5 (3), alk phos 66, AST 50 (607), ALT 211 (698), INR 1.30 (1.6).  Acute hepatitis panel negative.  RUQ ultrasound with gallstones with gallbladder wall  thickening but no significant pericholecystic fluid, negative Murphy sign.  CBD measuring 3 mm.  Urine drug screen negative.  Etiology unclear, but likely secondary to ischemic injury.  Reassuringly LFTs continue to improve.  Continues to deny any abdominal pain.  Diarrhea: Recent onset 3 days prior to admission.  Since admission she has only had a couple of loose stools, none within the last 24 hours.  C. difficile and GI pathogen panel negative.  AMS/lethargy: Likely due to metabolic encephalopathy in setting of AKI and transaminitis.  CT head without acute findings.  Ammonia level remains WNL.  There was question of possible asterixis on Wednesday on exam however she was without asterixis yesterday and today.  She is alert and oriented x4 and having appropriate conversations.  She walked around the unit today and is set up in the chair.  Complex avascular masslike structure abutting the superior aspect of the uterus measuring 7.4 x 5.4 x 8.2 cm: This was noted on renal ultrasound, pelvic ultrasound was recommended, defer to hospitalist   Plan / Recommendations  Pelvic ultrasound per hospitalist Continue to trend LFTs Continue to monitor for further altered mental status and hepatic encephalopathy.    LOS: 4 days    08/03/2021, 8:42 AM   Venetia Night, MSN, FNP-BC, AGACNP-BC [facility]

## 2021-08-03 NOTE — TOC Progression Note (Addendum)
Transition of Care [facility]) - Progression Note    Patient Details  Name: Christina Davenport MRN: [id number] Date of Birth: 1970/05/27  Transition of Care [facility]) CM/SW [place], [place] Phone Number: 08/03/2021, 1:17 PM  Clinical Narrative:    CSW spoke with pt and family in room about getting NIV ordered through [facility]. CSW working with [place]. CSW updated that plan is now to get pt BiPap. CSW to be sent orders and will get MD to sign. CSW sent signed order to [facility]. They will work on getting the order started. TOC to follow.   Expected Discharge Plan: Home/Self Care    Expected Discharge Plan and Services Expected Discharge Plan: Home/Self Care                                               Social Determinants of Health (SDOH) Interventions    Readmission Risk Interventions     No data to display

## 2021-08-03 NOTE — Progress Notes (Signed)
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  Despite aggressive treatment, patient continues to exhibit signs of significant hypercapnia associated with chronic respiratory failure.  Patient requires the use of NIV both nightly and daytime with naps to help with daytime sleepiness and ability to function    - The use of the NIV will treat patient's high PCO2 levels (please see ABG results on Bipap), and use of NIV can reduce risk of exacerbation in future hospitalizations when used at night and during the day.   Patient will need these advanced settings in conjunction with the current medication regimen: BiPAP is not an option due to his functional limitations and the severity of the patient's condition.    Failure to have NIV available for use could lead to death.   Patient had significant episodes of severe lethargy due to very high CO2 levels    ABG on  date 08/01/21  at time 14:45 done on oxygen with Fi02 of 32% Arterial Blood Gas result:  pO2 75; pCO2 -76 pH 7.32,  O2 Sat 95 %.

## 2021-08-04 DIAGNOSIS — N1831 Chronic kidney disease, stage 3a: Secondary | ICD-10-CM | POA: Diagnosis not present

## 2021-08-04 DIAGNOSIS — G9340 Encephalopathy, unspecified: Secondary | ICD-10-CM | POA: Diagnosis not present

## 2021-08-04 DIAGNOSIS — N179 Acute kidney failure, unspecified: Secondary | ICD-10-CM | POA: Diagnosis not present

## 2021-08-04 LAB — BLOOD GAS, ARTERIAL
Acid-Base Excess: 18.5 mmol/L — ABNORMAL HIGH (ref 0.0–2.0)
Acid-Base Excess: 16.7 mmol/L — ABNORMAL HIGH (ref 0.0–2.0)
Bicarbonate: 44.7 mmol/L — ABNORMAL HIGH (ref 20.0–28.0)
Bicarbonate: 40.3 mmol/L — ABNORMAL HIGH (ref 20.0–28.0)
Drawn by: 22179
Drawn by: 27016
FIO2: 21 %
FIO2: 21 %
O2 Saturation: 76.6 %
O2 Saturation: 97.2 %
Patient temperature: 36.9
Patient temperature: 37
pCO2 arterial: 56 mmHg — ABNORMAL HIGH (ref 32–48)
pCO2 arterial: 42 mmHg (ref 32–48)
pH, Arterial: 7.51 — ABNORMAL HIGH (ref 7.35–7.45)
pH, Arterial: 7.59 — ABNORMAL HIGH (ref 7.35–7.45)
pO2, Arterial: 47 mmHg — ABNORMAL LOW (ref 83–108)
pO2, Arterial: 75 mmHg — ABNORMAL LOW (ref 83–108)

## 2021-08-04 LAB — BASIC METABOLIC PANEL
Anion gap: 5 (ref 5–15)
BUN: 24 mg/dL — ABNORMAL HIGH (ref 6–20)
CO2: 34 mmol/L — ABNORMAL HIGH (ref 22–32)
Calcium: 8.5 mg/dL — ABNORMAL LOW (ref 8.9–10.3)
Chloride: 103 mmol/L (ref 98–111)
Creatinine, Ser: 1.19 mg/dL — ABNORMAL HIGH (ref 0.44–1.00)
GFR, Estimated: 56 mL/min — ABNORMAL LOW (ref >60)
Glucose, Bld: 71 mg/dL (ref 70–99)
Potassium: 3.7 mmol/L (ref 3.5–5.1)
Sodium: 142 mmol/L (ref 135–145)

## 2021-08-04 LAB — GLUCOSE, CAPILLARY
Glucose-Capillary: 78 mg/dL (ref 70–99)
Glucose-Capillary: 101 mg/dL — ABNORMAL HIGH (ref 70–99)
Glucose-Capillary: 91 mg/dL (ref 70–99)
Glucose-Capillary: 102 mg/dL — ABNORMAL HIGH (ref 70–99)

## 2021-08-04 MED ORDER — SPIRONOLACTONE 12.5 MG HALF TABLET
12.5000 mg | ORAL_TABLET | Freq: Every day | ORAL | Status: DC
  Administered 2021-08-04 – 2021-08-05 (×2): 12.5 mg via ORAL
  Filled 2021-08-04 (×2): qty 1

## 2021-08-04 MED ORDER — ACETAMINOPHEN 325 MG PO TABS
650.0000 mg | ORAL_TABLET | Freq: Four times a day (QID) | ORAL | Status: DC | PRN
  Administered 2021-08-04 – 2021-08-05 (×3): 650 mg via ORAL
  Filled 2021-08-04 (×3): qty 2

## 2021-08-04 MED ORDER — AMLODIPINE BESYLATE 5 MG PO TABS
10.0000 mg | ORAL_TABLET | Freq: Every day | ORAL | Status: DC
  Administered 2021-08-04 – 2021-08-05 (×2): 10 mg via ORAL
  Filled 2021-08-04 (×2): qty 2

## 2021-08-04 NOTE — Progress Notes (Signed)
PROGRESS NOTE     Christina Davenport, is a 51 y.o. female, DOB - 1971-02-24, KZL:[id number]  Admit date - 07/30/2021   Admitting Physician Evalee Mutton Kristeen Mans, MD  Outpatient Primary MD for the patient is Fayrene Helper, MD  LOS - 5  Chief Complaint  Patient presents with   Altered Mental Status        Brief Narrative:  51 y.o. 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 admitted on 07/30/2021 with acute metabolic encephalopathy in the setting of AKI and elevated LFTs and diarrhea    -Assessment and Plan: 1)Acute elevation of LFTs--- GI input appreciated -Right upper quadrant ultrasound without acute findings -Viral hepatitis panel negative -CK and TSH WNL -Denies EtOH use -Suspect this may be related to hypovolemia/ischemia -Overall LFTs are trending down    Latest Ref Rng & Units 08/03/2021    4:15 AM 08/02/2021    5:29 AM 08/01/2021    5:47 AM  Hepatic Function  Total Protein 6.5 - 8.1 g/dL 6.1  6.3  6.9   Albumin 3.5 - 5.0 g/dL 2.7  2.9  3.0   AST 15 - 41 U/L 50  97  217   ALT 0 - 44 U/L 211  303  469   Alk Phosphatase 38 - 126 U/L 66  79  96   Total Bilirubin 0.3 - 1.2 mg/dL 1.5  1.4  1.9   Bilirubin, Direct 0.0 - 0.2 mg/dL 0.6  0.8      2)AKI----acute kidney injury on CKD 3b- worsening renal function due to dehydration in the setting of recent diarrhea -Creatinine was 2.66 on 07/27/2021 from a baseline usually around 1.5 -Creatinine currently improving with hydration, noted to be 1.19 today - renally adjust medications, avoid nephrotoxic agents / dehydration  / hypotension  3) acute metabolic cephalopathy--- due to #1 #2 above -Mental status has appeared to be slowly improving -Although she is somnolent, she wakes up to voice and answers questions appropriately -ABG shows hypercapnia which appears to be chronic -Started on BiPAP, pulmonology  following -Follow-up ABG in a.m. appears significantly improved, pCO2 is down and pH is better -TOC assisting in setting of NIV device at home -Request has been made for patient to have ABG drawn this afternoon, not wear BiPAP overnight and then have repeat ABG tomorrow morning  4)Left eye conjunctivitis--patient's left eye typically does not close since her last surgery so she has to use artificial tears eyedrops from time to time-suspect superimposed infection - please see photos in epic -Tobramycin as ordered -Consider outpatient ophthalmology follow-up  5)Diarrhea--- leukocytosis was present, now resolved. Stool for C. difficile and stool pathogen negative -Overall stool frequency appears to have improved  6)HTN -Blood pressure is trending up, mildly tachycardic -Continue Coreg, Lasix -Restart amlodipine and Aldactone  7)Morbid Obesity-/presumed OSA/chronic respiratory acidosis -Low calorie diet, portion control and increase physical activity discussed with patient -Body mass index is 56.46 kg/m. 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 Garwin Brothers -Pelvic ultrasound shows multiloculated complex cystic mass of the left ovary -Plans are to follow-up with gynecology for further work-up   Disposition/Need for in-Hospital Stay- patient unable to be discharged until home NIV can be  arranged Status is: Inpatient   Disposition: The patient is from: Home              Anticipated d/c is to: Home              Anticipated d/c date is: 2 days              Patient currently is not medically stable to d/c. Barriers: Not Clinically Stable-   Code Status :  -  Code Status: Full Code   Family Communication:    (patient is alert, awake and coherent)  Discussed with patient's mother at bedside  DVT Prophylaxis  :   - SCDs   heparin injection 5,000 Units Start: 07/30/21 2200   Lab Results  Component Value Date   PLT 227 08/01/2021    Inpatient Medications  Scheduled Meds:  allopurinol  100 mg Oral Daily   amLODipine  10 mg Oral Daily   carvedilol  6.25 mg Oral BID WC   Chlorhexidine Gluconate Cloth  6 each Topical Daily   ferrous sulfate  325 mg Oral Daily   furosemide  40 mg Oral Daily   heparin  5,000 Units Subcutaneous Q8H   insulin aspart  0-9 Units Subcutaneous TID WC   spironolactone  12.5 mg Oral Daily   tobramycin  2 drop Left Eye Q6H   Continuous Infusions:   PRN Meds:.acetaminophen, albuterol, hydrALAZINE, ondansetron **OR** ondansetron (ZOFRAN) IV, sodium chloride   Anti-infectives (From admission, onward)    None       Subjective: She is feeling better.  She feels less tired now since she has been wearing BiPAP overnight.  Reports good urine output with Lasix.  No shortness of breath.  Feels that her left eye is also improving.  Objective: Vitals:   08/04/21 0424 08/04/21 0718 08/04/21 0750 08/04/21 1157  BP: (!) 189/102     Pulse:      Resp:      Temp:  98.7 F (37.1 C)  98.5 F (36.9 C)  TempSrc:  Oral  Oral  SpO2:   94%   Weight:      Height:       No intake or output data in the 24 hours ending 08/04/21 1210  Filed Weights   07/30/21 2049 08/02/21 1532 08/03/21 0500  Weight: (!) 150.5 kg (!) 153.9 kg (!) 153.9 kg    Physical Exam  General exam: Alert, awake, oriented x 3 Respiratory system: Clear to auscultation.  Respiratory effort normal. Cardiovascular system:RRR. No murmurs, rubs, gallops. Gastrointestinal system: Abdomen is nondistended, soft and nontender. No organomegaly or masses felt. Normal bowel sounds heard. Central nervous system: Alert and oriented. No focal neurological deficits.  She has chronic left facial droop Extremities: 1+ edema bilaterally Skin: No rashes, lesions or ulcers Psychiatry: Judgement and insight appear normal. Mood & affect appropriate.    Data Reviewed: I have personally reviewed following labs and imaging studies  CBC: Recent Labs  Lab 07/30/21 1300 07/30/21 1913 07/31/21 0511 08/01/21 0547  WBC 10.9* 11.6* 12.6* 8.9  NEUTROABS 8.1*  --   --   --   HGB 12.2 12.6 11.4* 11.8*  HCT 44.8 46.8* 43.2 46.3*  MCV 77.5* 77.7* 79.1*  80.9  PLT 263 279 264 476   Basic Metabolic Panel: Recent Labs  Lab 07/31/21 0511 08/01/21 0547 08/02/21 0529 08/03/21 0415 08/04/21 0431  NA 146* 143 140 143 142  K 4.3 4.9 4.2 3.9 3.7  CL 103 106 102 106 103  CO2 33* 28 31 32 34*  GLUCOSE 96 87 73 80 71  BUN 61* 56* 51* 36* 24*  CREATININE 1.61* 1.75* 1.77* 1.25* 1.19*  CALCIUM 9.0 8.3* 8.2* 8.4* 8.5*   GFR: Estimated Creatinine Clearance: 86.5 mL/min (A) (by C-G formula based on SCr of 1.19 mg/dL (H)). Liver Function Tests: Recent Labs  Lab 07/30/21 1300 07/31/21 0511 08/01/21 0547 08/02/21 0529 08/03/21 0415  AST 607* 417* 217* 97* 50*  ALT 698* 606* 469* 303* 211*  ALKPHOS 112 94 96 79 66  BILITOT 3.0* 2.2* 1.9* 1.4* 1.5*  PROT 7.6 6.9 6.9 6.3* 6.1*  ALBUMIN 3.7 3.2* 3.0* 2.9* 2.7*   Cardiac Enzymes: Recent Labs  Lab 07/31/21 0511  CKTOTAL 97   BNP (last 3 results) No results for input(s): "PROBNP" in the last 8760 hours. HbA1C: No results for input(s): "HGBA1C" in the last 72 hours. Sepsis Labs: '@LABRCNTIP' (procalcitonin:4,lacticidven:4) ) Recent Results (from the past 240 hour(s))  C Difficile Quick Screen w PCR reflex     Status: None    Collection Time: 07/31/21  6:01 PM  Result Value Ref Range Status   C Diff antigen NEGATIVE NEGATIVE Final   C Diff toxin NEGATIVE NEGATIVE Final   C Diff interpretation No C. difficile detected.  Final    Comment: Performed at [facility], [address] [place], [place] [address]  Gastrointestinal Panel by PCR , Stool     Status: None   Collection Time: 07/31/21  6:02 PM   Specimen: STOOL  Result Value Ref Range Status   Campylobacter species NOT DETECTED NOT DETECTED Final   Plesimonas shigelloides NOT DETECTED NOT DETECTED Final   Salmonella species NOT DETECTED NOT DETECTED Final   Yersinia enterocolitica NOT DETECTED NOT DETECTED Final   Vibrio species NOT DETECTED NOT DETECTED Final   Vibrio cholerae NOT DETECTED NOT DETECTED Final   Enteroaggregative E coli (EAEC) NOT DETECTED NOT DETECTED Final   Enteropathogenic E coli (EPEC) NOT DETECTED NOT DETECTED Final   Enterotoxigenic E coli (ETEC) NOT DETECTED NOT DETECTED Final   Shiga like toxin producing E coli (STEC) NOT DETECTED NOT DETECTED Final   Shigella/Enteroinvasive E coli (EIEC) NOT DETECTED NOT DETECTED Final   Cryptosporidium NOT DETECTED NOT DETECTED Final   Cyclospora cayetanensis NOT DETECTED NOT DETECTED Final   Entamoeba histolytica NOT DETECTED NOT DETECTED Final   Giardia lamblia NOT DETECTED NOT DETECTED Final   Adenovirus F40/41 NOT DETECTED NOT DETECTED Final   Astrovirus NOT DETECTED NOT DETECTED Final   Norovirus GI/GII NOT DETECTED NOT DETECTED Final   Rotavirus A NOT DETECTED NOT DETECTED Final   Sapovirus (I, II, IV, and V) NOT DETECTED NOT DETECTED Final    Comment: Performed at [facility], [address] [place], [place] [address]  MRSA Next Gen by PCR, Nasal     Status: None   Collection Time: 08/02/21  3:20 PM   Specimen: Nasal Mucosa; Nasal Swab  Result Value Ref Range Status   MRSA by PCR Next Gen NOT DETECTED NOT DETECTED Final    Comment: (NOTE) The GeneXpert MRSA Assay (FDA  approved for NASAL specimens only), is one component of a comprehensive MRSA colonization surveillance program. It is not intended  to diagnose MRSA infection nor to guide or monitor treatment for MRSA infections. Test performance is not FDA approved in patients less than 58 years old. Performed at [facility], [address], [place], [place] [address]     Radiology Studies: US PELVIC COMPLETE WITH TRANSVAGINAL  Result Date: 08/03/2021 CLINICAL DATA:  Pelvic mass on renal ultrasound; prior RIGHT oophorectomy; LMP 07/18/2021 EXAM: TRANSABDOMINAL AND TRANSVAGINAL ULTRASOUND OF PELVIS TECHNIQUE: Both transabdominal and transvaginal ultrasound examinations of the pelvis were performed. Transabdominal technique was performed for global imaging of the pelvis including uterus, ovaries, adnexal regions, and pelvic cul-de-sac. It was necessary to proceed with endovaginal exam following the transabdominal exam to visualize the endometrium, and to characterize a cystic lesion in the LEFT adnexa. COMPARISON:  renal ultrasound 07/31/2021, pelvic ultrasound 11/12/2010 FINDINGS: Uterus Measurements: 7.6 x 3.9 x 5.3 cm = volume: 81 mL. Anteverted. Normal morphology without mass. Nabothian cysts at cervix. Endometrium Thickness: 7 mm.  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 Multiloculated complex cystic mass identified within LEFT ovary containing complex cystic collections with which contain mural nodularity and thick irregular septations as well as a hyperechoic region measuring approximately 3 cm diameter. Other findings No free pelvic fluid or additional pelvic masses. IMPRESSION: Unremarkable uterus and endometrial complex. 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  Multiloculated complex cystic mass LEFT ovary 10 cm greatest size containing a 3 cm hyperechoic region, thick irregular septations, and scattered mural nodularity, appearance overall most suggestive of a dermoid tumor of the LEFT ovary; however, due to the presence of mural nodularity and thick irregular septations, surgical evaluation is recommended to definitively exclude ovarian malignancy. Electronically Signed   By: Lavonia Dana M.D.   On: 08/03/2021 14:23     Scheduled Meds:  allopurinol  100 mg Oral Daily   amLODipine  10 mg Oral Daily   carvedilol  6.25 mg Oral BID WC   Chlorhexidine Gluconate Cloth  6 each Topical Daily   ferrous sulfate  325 mg Oral Daily   furosemide  40 mg Oral Daily   heparin  5,000 Units Subcutaneous Q8H   insulin aspart  0-9 Units Subcutaneous TID WC   spironolactone  12.5 mg Oral Daily   tobramycin  2 drop Left Eye Q6H   Continuous Infusions:     LOS: 5 days    Kathie Dike M.D on 08/04/2021 at 12:10 PM  Go to [email or web address] - for contact info  [facility] - Office  [phone]  If 7PM-7AM, please contact night-coverage [email or web address]  08/04/2021, 12:10 PM

## 2021-08-04 NOTE — Progress Notes (Signed)
Pt very upset and crying this evening because of pain and having to wait for pain medication. Tried to explain to the pt that I have to wait for pain medication orders to be entered by provider and approved by pharmacy in order to be able to obtain and administer them. She became more upset with this response because she could not understand she does not have any active PRN orders for pain medication management.   Pt called family to be involved to express how upset they are about her not having any pain medication orders readily available.   Pt requested to speak with a provider but after the medication was administered she no longer wanted to speak with the provider. With her request, RT put her on the bipap for the evening so she can rest.  Updated Dr. Josephine Cables with the pt's and families' concern for no active   Pt currently is resting on bipap with no further concerns.

## 2021-08-04 NOTE — Progress Notes (Signed)
CSW spoke with Ashly at [facility] to obtain an update about the patient's bipap that was ordered. CSW communicated updated request from [facility] to obtain approval for machine.  Madilyn Fireman, MSW, LCSW Transitions of Care  Clinical Social Worker II [phone]

## 2021-08-05 DIAGNOSIS — N1831 Chronic kidney disease, stage 3a: Secondary | ICD-10-CM | POA: Diagnosis not present

## 2021-08-05 DIAGNOSIS — N179 Acute kidney failure, unspecified: Secondary | ICD-10-CM | POA: Diagnosis not present

## 2021-08-05 DIAGNOSIS — G9340 Encephalopathy, unspecified: Secondary | ICD-10-CM | POA: Diagnosis not present

## 2021-08-05 LAB — COMPREHENSIVE METABOLIC PANEL
ALT: 106 U/L — ABNORMAL HIGH (ref 0–44)
AST: 28 U/L (ref 15–41)
Albumin: 2.7 g/dL — ABNORMAL LOW (ref 3.5–5.0)
Alkaline Phosphatase: 47 U/L (ref 38–126)
Anion gap: 4 — ABNORMAL LOW (ref 5–15)
BUN: 19 mg/dL (ref 6–20)
CO2: 33 mmol/L — ABNORMAL HIGH (ref 22–32)
Calcium: 8.1 mg/dL — ABNORMAL LOW (ref 8.9–10.3)
Chloride: 101 mmol/L (ref 98–111)
Creatinine, Ser: 1.15 mg/dL — ABNORMAL HIGH (ref 0.44–1.00)
GFR, Estimated: 58 mL/min — ABNORMAL LOW (ref >60)
Glucose, Bld: 72 mg/dL (ref 70–99)
Potassium: 3.5 mmol/L (ref 3.5–5.1)
Sodium: 138 mmol/L (ref 135–145)
Total Bilirubin: 1.7 mg/dL — ABNORMAL HIGH (ref 0.3–1.2)
Total Protein: 5.9 g/dL — ABNORMAL LOW (ref 6.5–8.1)

## 2021-08-05 LAB — BLOOD GAS, ARTERIAL
Acid-Base Excess: 15.6 mmol/L — ABNORMAL HIGH (ref 0.0–2.0)
Bicarbonate: 42.2 mmol/L — ABNORMAL HIGH (ref 20.0–28.0)
Drawn by: 10555
FIO2: 28 %
O2 Saturation: 94 %
Patient temperature: 37
pCO2 arterial: 58 mmHg — ABNORMAL HIGH (ref 32–48)
pH, Arterial: 7.47 — ABNORMAL HIGH (ref 7.35–7.45)
pO2, Arterial: 68 mmHg — ABNORMAL LOW (ref 83–108)

## 2021-08-05 LAB — GLUCOSE, CAPILLARY
Glucose-Capillary: 77 mg/dL (ref 70–99)
Glucose-Capillary: 80 mg/dL (ref 70–99)
Glucose-Capillary: 86 mg/dL (ref 70–99)

## 2021-08-05 MED ORDER — TOBRAMYCIN 0.3 % OP SOLN: 2.0000 [drp] | Freq: Four times a day (QID) | OPHTHALMIC | 0 refills | Status: DC

## 2021-08-05 MED ORDER — FUROSEMIDE 20 MG PO TABS: 40.0000 mg | ORAL_TABLET | Freq: Every day | ORAL | 6 refills | Status: DC

## 2021-08-05 NOTE — Discharge Summary (Signed)
Physician Discharge Summary  CANESHA TESFAYE JDB:[id number] DOB: 1970-05-19 DOA: 07/30/2021  PCP: Fayrene Helper, MD  Admit date: 07/30/2021 Discharge date: 08/05/2021  Admitted From: Home Disposition: Home  Recommendations for Outpatient Follow-up:  Follow up with PCP in 1-2 weeks Please obtain BMP/CBC in one week Patient will have sleep study and pulmonology follow-up set up by pulmonology office We will need to follow-up with her gynecologist for further work-up/management of left ovarian cystic mass Follow-up with ophthalmology as needed for persistent left conjunctivitis  Home Health: Equipment/Devices: BiPAP nightly  Discharge Condition: Stable CODE STATUS: Full code Diet recommendation: Heart healthy  Brief/Interim Summary: 51 y.o. 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 admitted on 07/30/2021 with acute metabolic encephalopathy in the setting of AKI and elevated LFTs and diarrhea  Discharge Diagnoses:  Principal Problem:   Acute encephalopathy Active Problems:   Morbid obesity (HCC)   Hyperlipidemia LDL goal <100   CKD (chronic kidney disease) stage 3, GFR 30-59 ml/min (HCC)   Paralysis of one side of face   Type 2 diabetes mellitus with other specified complication (HCC)   AKI (acute kidney injury) ([place])   Transaminitis   Altered mental status  Acute elevation of LFTs--- GI input appreciated -Right upper quadrant ultrasound without acute findings -Viral hepatitis panel negative -CK and TSH WNL -Denies EtOH use -Suspect this may be related to hypovolemia/ischemia -Overall LFTs are trending down     Latest Ref Rng & Units 08/03/2021    4:15 AM 08/02/2021    5:29 AM 08/01/2021    5:47 AM  Hepatic Function  Total Protein 6.5 - 8.1 g/dL 6.1  6.3  6.9   Albumin 3.5 - 5.0 g/dL 2.7  2.9  3.0   AST 15 - 41 U/L 50  97  217   ALT 0 - 44 U/L  211  303  469   Alk Phosphatase 38 - 126 U/L 66  79  96   Total Bilirubin 0.3 - 1.2 mg/dL 1.5  1.4  1.9   Bilirubin, Direct 0.0 - 0.2 mg/dL 0.6  0.8         2)AKI----acute kidney injury on CKD 3b- worsening renal function due to dehydration in the setting of recent diarrhea -Creatinine was 2.66 on 07/27/2021 from a baseline usually around 1.5 -Creatinine currently improving with hydration, noted to be 1.15 today - renally adjust medications, avoid nephrotoxic agents / dehydration  / hypotension   3) acute metabolic cephalopathy--- due to #1 #2 above -Mental status has appeared to be slowly improving -Although she is somnolent, she wakes up to voice and answers questions appropriately -ABG shows hypercapnia which appears to be chronic -Started on BiPAP, pulmonology following -Follow-up ABG in a.m. appears significantly improved, pCO2 is down and pH is better -TOC assisted in patient getting a BiPAP to use at home   4)Left eye conjunctivitis--patient's left eye typically does not close since her last surgery so she has to use artificial tears eyedrops from time to time-suspect superimposed infection - please see photos in epic -Tobramycin as ordered -Consider outpatient ophthalmology follow-up if erythema does not resolve   5)Diarrhea--- leukocytosis was present, now resolved. Stool for C. difficile and stool pathogen negative -Suspect some self-limiting viral process -Overall stool frequency appears to have improved   6)HTN -Blood pressure is trending up, mildly tachycardic -Continue Coreg, Lasix -Restart amlodipine and Aldactone  7)Morbid Obesity-/presumed OSA/chronic respiratory acidosis -Low calorie diet, portion control and increase physical activity discussed with patient -Body mass index is 56.46 kg/m. 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 Garwin Brothers -Pelvic ultrasound shows multiloculated complex cystic mass of the left ovary -Plans are to follow-up with gynecology for further work-up  Discharge Instructions  Discharge Instructions     Diet - low sodium heart healthy   Complete by: As directed    Increase activity slowly   Complete by: As directed       Allergies as of 08/05/2021       Reactions   Enalapril Maleate Hives, Itching, Swelling   Metformin Nausea And Vomiting   abd pain,    Other Itching        Medication List     STOP taking these medications    atorvastatin 20 MG tablet Commonly known as: LIPITOR       TAKE these medications    acetaminophen 500 MG tablet Commonly known as: TYLENOL Take 500 mg by mouth every 6 (six) hours as needed for mild pain.   allopurinol 100 MG tablet Commonly known as: ZYLOPRIM Take 100 mg by mouth daily.   amLODipine-valsartan 10-320 MG tablet Commonly known as: EXFORGE Take 1 tablet by mouth daily. What changed: when to take this   carvedilol 6.25 MG tablet Commonly known as: COREG Take 6.25 mg by mouth 2 (two) times daily.   Cholecalciferol 25 MCG (1000 UT) tablet Take 1,000 Units by mouth daily.   Eye Lubricant Oint Place 1 tablet into both eyes in the morning, at noon, and at bedtime.   ferrous sulfate 325 (65 FE) MG EC tablet Take 1 tablet by mouth daily.   furosemide 20 MG tablet Commonly known as:  LASIX Take 2 tablets (40 mg total) by mouth daily. What changed: how much to take   meclizine 25 MG tablet Commonly known as: ANTIVERT Take 1 tablet (25 mg total) by mouth 3 (three) times daily as needed for dizziness.   spironolactone 25 MG tablet Commonly known as: Aldactone Take 0.5 tablets (12.5 mg total) by mouth daily.   tobramycin 0.3 % ophthalmic solution Commonly known as: TOBREX Place 2 drops into the left eye every 6 (six) hours.        Follow-up Information     Chesley Mires, MD Follow up.   Specialty: Pulmonary Disease Why: office will contact you to schedule appointment for sleep study at [facility] and then follow up with Dr Halford Chessman in [place] office Contact information: [place] [address] [phone]         Servando Salina, MD. Schedule an appointment as soon as possible for a visit in 2 week(s).   Specialty: Obstetrics and Gynecology Why: follow up results of pelvic ultrasound Contact information: [place] [address] [phone]  Fayrene Helper, MD. Schedule an appointment as soon as possible for a visit in 2 week(s).   Specialty: Family Medicine Contact information: [place] [address] [phone]         FMLA papers can be faxed to [phone] if hospitalist group can be of assistance Follow up.          follow up with ophthalmologist if left eye redness persists Follow up.                 Allergies  Allergen Reactions   Enalapril Maleate Hives, Itching and Swelling   Metformin Nausea And Vomiting    abd pain,    Other Itching    Consultations: Gastroenterology Pulmonology   Procedures/Studies: US PELVIC COMPLETE WITH TRANSVAGINAL  Result Date: 08/03/2021 CLINICAL DATA:  Pelvic mass on renal ultrasound; prior RIGHT oophorectomy; LMP 07/18/2021 EXAM: TRANSABDOMINAL AND TRANSVAGINAL ULTRASOUND OF  PELVIS TECHNIQUE: Both transabdominal and transvaginal ultrasound examinations of the pelvis were performed. Transabdominal technique was performed for global imaging of the pelvis including uterus, ovaries, adnexal regions, and pelvic cul-de-sac. It was necessary to proceed with endovaginal exam following the transabdominal exam to visualize the endometrium, and to characterize a cystic lesion in the LEFT adnexa. COMPARISON:  renal ultrasound 07/31/2021, pelvic ultrasound 11/12/2010 FINDINGS: Uterus Measurements: 7.6 x 3.9 x 5.3 cm = volume: 81 mL. Anteverted. Normal morphology without mass. Nabothian cysts at cervix. Endometrium Thickness: 7 mm.  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 Multiloculated complex cystic mass identified within LEFT ovary containing complex cystic collections with which contain mural nodularity and thick irregular septations as well as a hyperechoic region measuring approximately 3 cm diameter. Other findings No free pelvic fluid or additional pelvic masses. IMPRESSION: Unremarkable uterus and endometrial complex. 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 Multiloculated complex cystic mass LEFT ovary 10 cm greatest size containing a 3 cm hyperechoic region, thick irregular septations, and scattered mural nodularity, appearance overall most suggestive of a dermoid tumor of the LEFT ovary; however, due to the presence of mural nodularity and thick irregular septations, surgical evaluation is recommended to definitively exclude ovarian malignancy. Electronically Signed   By: Lavonia Dana M.D.   On: 08/03/2021 14:23   DG CHEST PORT 1 VIEW  Result Date: 08/01/2021 CLINICAL DATA:  Shortness of breath this afternoon. EXAM: PORTABLE CHEST 1 VIEW  COMPARISON:  AP chest 07/30/2021; chest two views 06/07/2021 in 12/22/2020 FINDINGS: VP shunt catheter tubing again overlies the right hemithorax. Cardiac silhouette is again mildly enlarged. Mediastinal contours are within normal limits. No definite pleural effusion. Improved lung volumes. Mild bilateral interstitial thickening is similar to prior. No pneumothorax is seen. No acute skeletal abnormality. IMPRESSION: No significant change from prior. Mild bilateral interstitial thickening may represent mild interstitial pulmonary edema. Electronically Signed   By: Yvonne Kendall M.D.   On: 08/01/2021 14:57   US RENAL  Result Date: 07/31/2021 CLINICAL DATA:  Acute kidney injury. EXAM: RENAL / URINARY TRACT ULTRASOUND COMPLETE COMPARISON:  Renal sonogram dated Jul 04, 2021 FINDINGS: Right Kidney: Renal measurements: 10.5 x 5.4 x 5.3 = volume: 156 mL. Echogenicity within normal limits. No mass or hydronephrosis visualized. Left Kidney: Renal measurements: 9.7 x 5.3 x 4.8 = volume:  130 mL. Echogenicity within normal limits. No mass or hydronephrosis visualized. Bladder: Appears normal for degree of bladder distention. Other: Complex avascular masslike structure abutting the superior aspect of the uterus measuring at least 7.4 x 5.4 x 8.2 cm. IMPRESSION: 1. evidence of nephrolithiasis or hydronephrosis. Limited evaluation due to bowel gas shadowing. 2. Complex avascular mass abutting the superior aspect of the uterus measuring at least 7.4 x 5.4 x 8.2 cm, this may be uterine or adnexal region, further evaluation with pelvic sonogram would be helpful. Electronically Signed   By: Keane Police D.O.   On: 07/31/2021 11:09   US Abdomen Limited  Result Date: 07/30/2021 CLINICAL DATA:  Abnormal LFTs EXAM: ULTRASOUND ABDOMEN LIMITED RIGHT UPPER QUADRANT COMPARISON:  None Available. FINDINGS: Gallbladder: Filled with echogenic shadowing calculi. Appears to be wall thickening with no significant pericholecystic fluid. Negative  sonographic Murphy sign. Common bile duct: Diameter: 3 mm Liver: No focal lesion identified. Within normal limits in parenchymal echogenicity. Portal vein is patent on color Doppler imaging with normal direction of blood flow towards the liver. Other: None. IMPRESSION: Cholelithiasis. Electronically Signed   By: Ofilia Neas M.D.   On: 07/30/2021 16:21   CT Head Wo Contrast  Result Date: 07/30/2021 CLINICAL DATA:  Mental status changes, unknown cause. EXAM: CT HEAD WITHOUT CONTRAST TECHNIQUE: Contiguous axial images were obtained from the base of the skull through the vertex without intravenous contrast. RADIATION DOSE REDUCTION: This exam was performed according to the departmental dose-optimization program which includes automated exposure control, adjustment of the mA and/or kV according to patient size and/or use of iterative reconstruction technique. COMPARISON:  None Available. FINDINGS: Brain: Right frontal ventriculostomy catheter terminates in the ventricles. Ventricle size is normal. Midline suboccipital craniotomy noted. Foramen magnum is decompressed. 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 No acute infarct, hemorrhage, or mass lesion is present. Ventricles are of normal size. No significant extra-axial fluid collection is present. Vascular: No hyperdense vessel or unexpected calcification. Skull: Metallic fragment noted in the posterior left neck, likely corresponding to remote trauma of the left occipital skull and mastoid. Metallic density present along the lateral floor of the middle cranial fossa. Sinuses/Orbits: The paranasal sinuses and mastoid air cells are clear. The globes and orbits are within normal limits. IMPRESSION: 1. Right frontal ventriculostomy catheter terminates in the ventricles. Ventricle size is normal. 2. 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 3.  Metallic fragment in the posterior left neck, likely corresponding to remote trauma of the left occipital skull and mastoid. 4. Metallic density along the lateral floor of the middle cranial fossa. Electronically Signed   By: San Morelle M.D.   On: 07/30/2021 15:09   DG Chest Portable 1 View  Result Date: 07/30/2021 CLINICAL DATA:  Shortness of breath.  Mental status changes. EXAM: PORTABLE CHEST 1 VIEW COMPARISON:  02/25/2009 FINDINGS: Mildly degraded exam due to AP portable technique and patient body habitus. VP shunt catheter projects over the right chest. Apical lordotic positioning. Midline trachea. Cardiomegaly accentuated by AP portable technique. No right and no definite left pleural effusion. Breast tissue projects over the left lung base. No pneumothorax. Interstitial prominence is accentuated by low lung volumes. No well-defined lobar consolidation. IMPRESSION: Decreased sensitivity and specificity exam due to technique related factors, as described above. Cardiomegaly and low lung volumes. Pulmonary interstitial thickening, accentuated by low lung volumes. Considerations include mild pulmonary venous congestion or the sequelae of asthma/chronic bronchitis (patient is  a never smoker). Electronically Signed   By: Abigail Miyamoto M.D.   On: 07/30/2021 13:22   ECHOCARDIOGRAM COMPLETE  Result Date: 07/27/2021    ECHOCARDIOGRAM REPORT   Patient Name:   NELWYN HEBDON Date of Exam: 07/27/2021 Medical Rec #:  [id number]         Height:       65.0 in Accession #:    [id number]        Weight:       344.2 lb Date of Birth:  Feb 22, 1971        BSA:          2.492 m Patient Age:    4 years          BP:           141/81 mmHg Patient Gender: F                 HR:           85 bpm. Exam Location:  [facility] Procedure: 2D Echo, Cardiac Doppler and Color Doppler Indications:    R06.02 (ICD-10-CM) - Shortness of breath  History:        Patient has no prior history of Echocardiogram examinations.                  CHF; Risk Factors:Hypertension, Diabetes and Dyslipidemia. Hx of                 COVID-19, Morbid Obesity.  Sonographer:    Alvino Chapel RCS Referring Phys: [id number] [place]  1. Left ventricular ejection fraction, by estimation, is 55 to 60%. The left ventricle has normal function. The left ventricle has no regional wall motion abnormalities. There is mild left ventricular hypertrophy. Left ventricular diastolic parameters were normal.  2. Right ventricular systolic function is normal. The right ventricular size is normal. There is mildly elevated pulmonary artery systolic pressure. The estimated right ventricular systolic pressure is 44.9 mmHg.  3. Right atrial size was mildly dilated.  4. The mitral valve is normal in structure. Trivial mitral valve regurgitation.  5. The aortic valve is tricuspid. Aortic valve regurgitation is not visualized. No aortic stenosis is present.  6. The inferior vena cava is dilated in size with <50% respiratory variability, suggesting right atrial pressure of 15 mmHg. FINDINGS  Left Ventricle: Left ventricular ejection fraction, by estimation, is 55 to 60%. The left ventricle has normal function. The left ventricle has no regional wall motion abnormalities. The left ventricular internal cavity size was normal in size. There is  mild left ventricular hypertrophy. Left ventricular diastolic parameters were normal. Right Ventricle: The right ventricular size is normal. No increase in right ventricular wall thickness. Right ventricular systolic function is normal. There is mildly elevated pulmonary artery systolic pressure. The tricuspid regurgitant velocity is 2.52  m/s, and with an assumed right atrial pressure of 15 mmHg, the estimated right ventricular systolic pressure is 67.5 mmHg. Left Atrium: Left atrial size was normal in size. Right Atrium: Right atrial size was mildly dilated. Pericardium: Trivial pericardial effusion is present. Mitral Valve: The mitral  valve is normal in structure. Trivial mitral valve regurgitation. Tricuspid Valve: The tricuspid valve is normal in structure. Tricuspid valve regurgitation is trivial. Aortic Valve: The aortic valve is tricuspid. Aortic valve regurgitation is not visualized. No aortic stenosis is present. Pulmonic Valve: The pulmonic valve was not well visualized. Pulmonic valve regurgitation is not visualized. Aorta: The aortic root is normal in  size and structure. Venous: The inferior vena cava is dilated in size with less than 50% respiratory variability, suggesting right atrial pressure of 15 mmHg. IAS/Shunts: The interatrial septum was not well visualized.  LEFT VENTRICLE PLAX 2D LVIDd:         5.00 cm   Diastology LVIDs:         2.90 cm   LV e' medial:    8.70 cm/s LV PW:         1.10 cm   LV E/e' medial:  15.1 LV IVS:        1.10 cm   LV e' lateral:   11.30 cm/s LVOT diam:     2.00 cm   LV E/e' lateral: 11.6 LV SV:         76 LV SV Index:   31 LVOT Area:     3.14 cm  RIGHT VENTRICLE RV S prime:     14.70 cm/s TAPSE (M-mode): 1.9 cm LEFT ATRIUM             Index        RIGHT ATRIUM           Index LA diam:        4.30 cm 1.73 cm/m   RA Area:     24.30 cm LA Vol (A2C):   78.6 ml 31.55 ml/m  RA Volume:   79.20 ml  31.79 ml/m LA Vol (A4C):   48.8 ml 19.59 ml/m LA Biplane Vol: 61.6 ml 24.72 ml/m  AORTIC VALVE LVOT Vmax:   122.00 cm/s LVOT Vmean:  77.600 cm/s LVOT VTI:    0.242 m  AORTA Ao Root diam: 3.10 cm MITRAL VALVE                TRICUSPID VALVE MV Area (PHT): 4.39 cm     TR Peak grad:   25.4 mmHg MV Decel Time: 173 msec     TR Vmax:        252.00 cm/s MV E velocity: 131.00 cm/s MV A velocity: 108.00 cm/s  SHUNTS MV E/A ratio:  1.21         Systemic VTI:  0.24 m                             Systemic Diam: 2.00 cm Oswaldo Milian MD Electronically signed by Oswaldo Milian MD Signature Date/Time: 07/27/2021/5:31:21 PM    Final       Subjective: She is feeling better.  No shortness of breath.  Somnolence is  also better.  Discharge Exam: Vitals:   08/05/21 0717 08/05/21 0800 08/05/21 1130 08/05/21 1137  BP:  (!) 164/81 (!) 158/83   Pulse: 94  100 92  Resp: 18 17 (!) 21 (!) 23  Temp: 98.8 F (37.1 C)   98.3 F (36.8 C)  TempSrc: Oral   Oral  SpO2: 98%  93% 91%  Weight:      Height:        General: Pt is alert, awake, not in acute distress Cardiovascular: RRR, S1/S2 +, no rubs, no gallops Respiratory: CTA bilaterally, no wheezing, no rhonchi Abdominal: Soft, NT, ND, bowel sounds + Extremities: 1+ edema, no cyanosis    The results of significant diagnostics from this hospitalization (including imaging, microbiology, ancillary and laboratory) are listed below for reference.     Microbiology: Recent Results (from the past 240 hour(s))  C Difficile Quick Screen w PCR reflex  Status: None   Collection Time: 07/31/21  6:01 PM  Result Value Ref Range Status   C Diff antigen NEGATIVE NEGATIVE Final   C Diff toxin NEGATIVE NEGATIVE Final   C Diff interpretation No C. difficile detected.  Final    Comment: Performed at [facility], [address] [place], [place] [address]  Gastrointestinal Panel by PCR , Stool     Status: None   Collection Time: 07/31/21  6:02 PM   Specimen: STOOL  Result Value Ref Range Status   Campylobacter species NOT DETECTED NOT DETECTED Final   Plesimonas shigelloides NOT DETECTED NOT DETECTED Final   Salmonella species NOT DETECTED NOT DETECTED Final   Yersinia enterocolitica NOT DETECTED NOT DETECTED Final   Vibrio species NOT DETECTED NOT DETECTED Final   Vibrio cholerae NOT DETECTED NOT DETECTED Final   Enteroaggregative E coli (EAEC) NOT DETECTED NOT DETECTED Final   Enteropathogenic E coli (EPEC) NOT DETECTED NOT DETECTED Final   Enterotoxigenic E coli (ETEC) NOT DETECTED NOT DETECTED Final   Shiga like toxin producing E coli (STEC) NOT DETECTED NOT DETECTED Final   Shigella/Enteroinvasive E coli (EIEC) NOT DETECTED NOT DETECTED Final    Cryptosporidium NOT DETECTED NOT DETECTED Final   Cyclospora cayetanensis NOT DETECTED NOT DETECTED Final   Entamoeba histolytica NOT DETECTED NOT DETECTED Final   Giardia lamblia NOT DETECTED NOT DETECTED Final   Adenovirus F40/41 NOT DETECTED NOT DETECTED Final   Astrovirus NOT DETECTED NOT DETECTED Final   Norovirus GI/GII NOT DETECTED NOT DETECTED Final   Rotavirus A NOT DETECTED NOT DETECTED Final   Sapovirus (I, II, IV, and V) NOT DETECTED NOT DETECTED Final    Comment: Performed at [facility], [address] [place], [place] [address]  MRSA Next Gen by PCR, Nasal     Status: None   Collection Time: 08/02/21  3:20 PM   Specimen: Nasal Mucosa; Nasal Swab  Result Value Ref Range Status   MRSA by PCR Next Gen NOT DETECTED NOT DETECTED Final    Comment: (NOTE) The GeneXpert MRSA Assay (FDA approved for NASAL specimens only), is one component of a comprehensive MRSA colonization surveillance program. It is not intended to diagnose MRSA infection nor to guide or monitor treatment for MRSA infections. Test performance is not FDA approved in patients less than 76 years old. Performed at [facility], [address], [place], [place] [address]      Labs: BNP (last 3 results) Recent Labs    07/27/21 1407 07/30/21 1330  BNP 272.5* 921.1*   Basic Metabolic Panel: Recent Labs  Lab 08/01/21 0547 08/02/21 0529 08/03/21 0415 08/04/21 0431 08/05/21 0310  NA 143 140 143 142 138  K 4.9 4.2 3.9 3.7 3.5  CL 106 102 106 103 101  CO2 28 31 32 34* 33*  GLUCOSE 87 73 80 71 72  BUN 56* 51* 36* 24* 19  CREATININE 1.75* 1.77* 1.25* 1.19* 1.15*  CALCIUM 8.3* 8.2* 8.4* 8.5* 8.1*   Liver Function Tests: Recent Labs  Lab 07/31/21 0511 08/01/21 0547 08/02/21 0529 08/03/21 0415 08/05/21 0310  AST 417* 217* 97* 50* 28  ALT 606* 469* 303* 211* 106*  ALKPHOS 94 96 79 66 47  BILITOT 2.2* 1.9* 1.4* 1.5* 1.7*  PROT 6.9 6.9 6.3* 6.1* 5.9*  ALBUMIN 3.2* 3.0* 2.9* 2.7* 2.7*    No results for input(s): "LIPASE", "AMYLASE" in the last 168 hours. Recent Labs  Lab 07/30/21 1422  AMMONIA 17   CBC: Recent Labs  Lab 07/30/21 1300 07/30/21 1913 07/31/21 0511 08/01/21 0547  WBC 10.9* 11.6* 12.6* 8.9  NEUTROABS 8.1*  --   --   --   HGB 12.2 12.6 11.4* 11.8*  HCT 44.8 46.8* 43.2 46.3*  MCV 77.5* 77.7* 79.1* 80.9  PLT 263 279 264 227   Cardiac Enzymes: Recent Labs  Lab 07/31/21 0511  CKTOTAL 97   BNP: Invalid input(s): "POCBNP" CBG: Recent Labs  Lab 08/04/21 1508 08/04/21 1651 08/04/21 2103 08/05/21 0716 08/05/21 1135  GLUCAP 101* 91 102* 80 86   D-Dimer No results for input(s): "DDIMER" in the last 72 hours. Hgb A1c No results for input(s): "HGBA1C" in the last 72 hours. Lipid Profile No results for input(s): "CHOL", "HDL", "LDLCALC", "TRIG", "CHOLHDL", "LDLDIRECT" in the last 72 hours. Thyroid function studies No results for input(s): "TSH", "T4TOTAL", "T3FREE", "THYROIDAB" in the last 72 hours.  Invalid input(s): "FREET3" Anemia work up No results for input(s): "VITAMINB12", "FOLATE", "FERRITIN", "TIBC", "IRON", "RETICCTPCT" in the last 72 hours. Urinalysis    Component Value Date/Time   COLORURINE YELLOW 07/30/2021 1342   APPEARANCEUR CLEAR 07/30/2021 1342   LABSPEC 1.012 07/30/2021 1342   PHURINE 6.0 07/30/2021 1342   GLUCOSEU NEGATIVE 07/30/2021 1342   HGBUR NEGATIVE 07/30/2021 1342   BILIRUBINUR NEGATIVE 07/30/2021 1342   KETONESUR NEGATIVE 07/30/2021 1342   PROTEINUR NEGATIVE 07/30/2021 1342   UROBILINOGEN 0.2 02/25/2009 0810   NITRITE NEGATIVE 07/30/2021 1342   LEUKOCYTESUR NEGATIVE 07/30/2021 1342   Sepsis Labs Recent Labs  Lab 07/30/21 1300 07/30/21 1913 07/31/21 0511 08/01/21 0547  WBC 10.9* 11.6* 12.6* 8.9   Microbiology Recent Results (from the past 240 hour(s))  C Difficile Quick Screen w PCR reflex     Status: None   Collection Time: 07/31/21  6:01 PM  Result Value Ref Range Status   C Diff antigen  NEGATIVE NEGATIVE Final   C Diff toxin NEGATIVE NEGATIVE Final   C Diff interpretation No C. difficile detected.  Final    Comment: Performed at [facility], [address], [place], [place] [address]  Gastrointestinal Panel by PCR , Stool     Status: None   Collection Time: 07/31/21  6:02 PM   Specimen: STOOL  Result Value Ref Range Status   Campylobacter species NOT DETECTED NOT DETECTED Final   Plesimonas shigelloides NOT DETECTED NOT DETECTED Final   Salmonella species NOT DETECTED NOT DETECTED Final   Yersinia enterocolitica NOT DETECTED NOT DETECTED Final   Vibrio species NOT DETECTED NOT DETECTED Final   Vibrio cholerae NOT DETECTED NOT DETECTED Final   Enteroaggregative E coli (EAEC) NOT DETECTED NOT DETECTED Final   Enteropathogenic E coli (EPEC) NOT DETECTED NOT DETECTED Final   Enterotoxigenic E coli (ETEC) NOT DETECTED NOT DETECTED Final   Shiga like toxin producing E coli (STEC) NOT DETECTED NOT DETECTED Final   Shigella/Enteroinvasive E coli (EIEC) NOT DETECTED NOT DETECTED Final   Cryptosporidium NOT DETECTED NOT DETECTED Final   Cyclospora cayetanensis NOT DETECTED NOT DETECTED Final   Entamoeba histolytica NOT DETECTED NOT DETECTED Final   Giardia lamblia NOT DETECTED NOT DETECTED Final   Adenovirus F40/41 NOT DETECTED NOT DETECTED Final   Astrovirus NOT DETECTED NOT DETECTED Final   Norovirus GI/GII NOT DETECTED NOT DETECTED Final   Rotavirus A NOT DETECTED NOT DETECTED Final   Sapovirus (I, II, IV, and V) NOT DETECTED NOT DETECTED Final    Comment: Performed at [facility], [address], [place], [place] [address]  MRSA Next Gen  by PCR, Nasal     Status: None   Collection Time: 08/02/21  3:20 PM   Specimen: Nasal Mucosa; Nasal Swab  Result Value Ref Range Status   MRSA by PCR Next Gen NOT DETECTED NOT DETECTED Final    Comment: (NOTE) The GeneXpert MRSA Assay (FDA approved for NASAL specimens only), is one component of a comprehensive MRSA  colonization surveillance program. It is not intended to diagnose MRSA infection nor to guide or monitor treatment for MRSA infections. Test performance is not FDA approved in patients less than 42 years old. Performed at [facility], [address], [place], [place] [address]      Time coordinating discharge: 49mns  SIGNED:   JKathie Dike MD  [facility] 08/05/2021, 7:11 PM   If 7PM-7AM, please contact night-coverage [email or web address]

## 2021-08-06 ENCOUNTER — Encounter: Payer: Self-pay | Admitting: Cardiology

## 2021-08-06 ENCOUNTER — Telehealth: Payer: Self-pay

## 2021-08-06 DIAGNOSIS — Z79899 Other long term (current) drug therapy: Secondary | ICD-10-CM

## 2021-08-06 NOTE — Telephone Encounter (Signed)
Transition Care Management Follow-up Telephone Call Date of discharge and from where: 08/05/21 [place] [place] How have you been since you were released from the hospital? Tired Any questions or concerns? No  Items Reviewed: Did the pt receive and understand the discharge instructions provided? Yes  Medications obtained and verified? Yes  Other?  N/a Any new allergies since your discharge? No  Dietary orders reviewed? Yes Do you have support at home? Yes   Home Care and Equipment/Supplies: Were home health services ordered? no If so, what is the name of the agency? N/a  Has the agency set up a time to come to the patient's home? not applicable Were any new equipment or medical supplies ordered?  No What is the name of the medical supply agency? N/a Were you able to get the supplies/equipment? not applicable Do you have any questions related to the use of the equipment or supplies? No  Functional Questionnaire: (I = Independent and D = Dependent) ADLs: I  Bathing/Dressing- I  Meal Prep- I  Eating- I  Maintaining continence- I  Transferring/Ambulation- I  Managing Meds- I  Follow up appointments reviewed:  PCP Hospital f/u appt confirmed? No  Patient declined appointment. [facility] f/u appt confirmed? No   Are transportation arrangements needed? No  If their condition worsens, is the pt aware to call PCP or go to the Emergency Dept.? Yes Was the patient provided with contact information for the PCP's office or ED? Yes Was to pt encouraged to call back with questions or concerns? Yes

## 2021-08-07 ENCOUNTER — Telehealth: Payer: Self-pay

## 2021-08-07 NOTE — Telephone Encounter (Signed)
ATC patient. Line rang for approx. 2 minutes with no answer and no vm option

## 2021-08-07 NOTE — Telephone Encounter (Signed)
-----   Message from Chesley Mires, MD sent at 08/03/2021  1:51 PM EDT ----- Carron Brazen, Ms. Corpening should be getting discharged from [facility] over the weekend.  Can you call her next week to get scheduled for a tele visit with me or Beth in about 2 to 3 weeks for hospital follow up visit.  [facility], Beth ordered a split night sleep study for Ms. Damita Lack at [facility].  Can this be changed to be done at [facility]?  Also, is there a way we can expedite getting this scheduled?  Thanks.  V

## 2021-08-08 NOTE — Telephone Encounter (Signed)
Weight is down a good bit from our last visit. A lot of medical issues during her recent admission that may have played a role. Would continue medications as prescribed at her discharge  Zandra Abts MD

## 2021-08-09 NOTE — Telephone Encounter (Signed)
Called and spoke to patient. Scheduled her with Derl Barrow NP ok per Dr. Halford Chessman for 6/26 mychart video visit. Nothing further needed.

## 2021-08-10 ENCOUNTER — Inpatient Hospital Stay: Payer: 59 | Admitting: Family Medicine

## 2021-08-13 ENCOUNTER — Telehealth: Payer: 59 | Admitting: Nurse Practitioner

## 2021-08-17 ENCOUNTER — Telehealth: Payer: Self-pay | Admitting: Family Medicine

## 2021-08-20 ENCOUNTER — Telehealth (INDEPENDENT_AMBULATORY_CARE_PROVIDER_SITE_OTHER): Payer: 59 | Admitting: Primary Care

## 2021-08-20 ENCOUNTER — Telehealth: Payer: Self-pay | Admitting: Primary Care

## 2021-08-20 ENCOUNTER — Telehealth: Payer: Self-pay | Admitting: *Deleted

## 2021-08-20 DIAGNOSIS — G4710 Hypersomnia, unspecified: Secondary | ICD-10-CM

## 2021-08-20 DIAGNOSIS — G4730 Sleep apnea, unspecified: Secondary | ICD-10-CM | POA: Diagnosis not present

## 2021-08-20 NOTE — Telephone Encounter (Signed)
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 At risk for obstructive/central sleep apnea. She has symptoms of daytime sleepiness and restless sleep. Epworth score 15. BMI 56. Needs split night sleep study, high suspicion she will need CPAP vs BIPAP.  Can we expedite sleep study please???? August is too far out. This is urgent

## 2021-08-21 NOTE — Telephone Encounter (Signed)
I called and got her appointment moved up to this week. When I called the patient she wasn't able to do that so I gave her the number for the sleep lab. She rescheduled it for the original date of 8/1. She told me that she was fine to wait until then if she had to because she feels like her cpap is helping at this time.

## 2021-08-22 ENCOUNTER — Encounter (HOSPITAL_BASED_OUTPATIENT_CLINIC_OR_DEPARTMENT_OTHER): Payer: 59 | Admitting: Pulmonary Disease

## 2021-08-24 NOTE — Telephone Encounter (Signed)
Can she get a bmet recheck kidney functino, may need to cut back fluid pill since weight has further trended down  Zandra Abts MD

## 2021-08-27 ENCOUNTER — Telehealth: Payer: Self-pay | Admitting: Pulmonary Disease

## 2021-08-27 DIAGNOSIS — G4730 Sleep apnea, unspecified: Secondary | ICD-10-CM

## 2021-08-27 NOTE — Telephone Encounter (Signed)
Bipap 08/06/21 to 08/20/21 >> used on 14 of 14 nights with average 6 hrs 32 min.  Average AHI 48.1 with Bipap 20/5 cm H2O   Please send an order to [facility] to have her Bipap setting changed to auto Bipap with max IPAP 20, minimum EPAP 8, and pressure support 4 cm H2O.

## 2021-08-29 NOTE — Telephone Encounter (Signed)
Called and notified patient of recommendations from Dr. Halford Chessman to have pressures adjusted. She voiced understanding.  Order placed. Nothing further needed.

## 2021-09-17 ENCOUNTER — Other Ambulatory Visit: Payer: Self-pay | Admitting: Family Medicine

## 2021-09-21 ENCOUNTER — Other Ambulatory Visit: Payer: Self-pay | Admitting: Family Medicine

## 2021-09-25 ENCOUNTER — Encounter: Payer: 59 | Admitting: Pulmonary Disease

## 2021-09-25 ENCOUNTER — Ambulatory Visit: Payer: 59 | Attending: Primary Care | Admitting: Pulmonary Disease

## 2021-09-25 DIAGNOSIS — G4719 Other hypersomnia: Secondary | ICD-10-CM | POA: Insufficient documentation

## 2021-09-25 DIAGNOSIS — Z9189 Other specified personal risk factors, not elsewhere classified: Secondary | ICD-10-CM | POA: Insufficient documentation

## 2021-09-26 ENCOUNTER — Encounter: Payer: Self-pay | Admitting: Cardiology

## 2021-09-26 ENCOUNTER — Ambulatory Visit (INDEPENDENT_AMBULATORY_CARE_PROVIDER_SITE_OTHER): Payer: 59 | Admitting: Cardiology

## 2021-09-26 VITALS — BP 130/80 | HR 88 | Ht 65.0 in | Wt 292.8 lb

## 2021-09-26 DIAGNOSIS — I10 Essential (primary) hypertension: Secondary | ICD-10-CM

## 2021-09-26 DIAGNOSIS — R0609 Other forms of dyspnea: Secondary | ICD-10-CM

## 2021-09-26 DIAGNOSIS — R600 Localized edema: Secondary | ICD-10-CM

## 2021-09-26 NOTE — Progress Notes (Signed)
Clinical Summary Ms. Plate is a 51 y.o.female  1.HTN -she reports component of white coat HTN -home bp's usually 130s/80s - recent normal renin/aldo ratio Upcoming sleep apnea evaluation - compliant with meds   2. Leg swelling/DOE - last visit volume overloaded, SOB/DOE. Weight had gone up from 307 last year to 344 lbs.  - changed to lasix '60mg'$  daily with significant diuresis - had AKI during hospital admission with diarrhea, lasix lowered to '40mg'$  daily - today weight down dramatically to 292 lbs. Swelling and breathing improved.  07/2021 echo: LVEF 55-60%, mild LVH, normal diastolic function, normal RV, PASP 40 -using elliptical machine up to 3 miles a day.      3. CKD - last Ctr 1.76, GFR 35 - followed by Dr Theador Hawthorne  - admit 07/2021 AKI on CKD in setting of diarrhea - f/u labs showed Cr back to 1.19   4. OSA  - using cpap   Past Medical History:  Diagnosis Date   Brain tumor (benign) ([place]) 2011   excision followed by radiation   CHF (congestive heart failure) ([place])    Phreesia 12/20/2019, patient denies today 08/08/20   Depression, major, single episode, moderate ([place]) 12/26/2019   PHQ 9 score of 12 in 11/2019   Diabetes mellitus type 2 in obese ([place]) 12/09/2007   Qualifier: Diagnosis of  By: Moshe Cipro MD, Margaret     Diabetes mellitus without complication ([place])    Phreesia 12/20/2019   Diabetes mellitus, type 2 ([place])    diet controlled   Endometrial thickening on ultra sound 01/08/2012   Reports had pelvic exam in 2015 will send for record    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   History of brain tumor 12/28/2015   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     allopurinol (ZYLOPRIM) 100 MG tablet Take 100 mg by mouth daily.     amLODipine (NORVASC) 5 MG tablet TAKE 1 TABLET BY MOUTH EVERY EVENING AT 9 PM 90 tablet 2   amLODipine-valsartan (EXFORGE) 10-320 MG tablet TAKE 1 TABLET BY MOUTH DAILY 90 tablet 2   carvedilol (COREG) 6.25 MG tablet Take 6.25 mg by mouth 2 (two) times daily.     Cholecalciferol 25 MCG (1000 UT) tablet Take 1,000 Units by mouth daily.     ferrous sulfate 325 (65 FE) MG EC tablet Take 1 tablet by mouth daily.     furosemide (LASIX) 20 MG tablet Take 2 tablets (40 mg total) by mouth daily. 90 tablet 6   spironolactone (ALDACTONE) 25 MG tablet Take 0.5 tablets (12.5 mg total) by mouth daily. 15 tablet 6   tobramycin (TOBREX) 0.3 % ophthalmic solution Place 2 drops into the left eye every 6 (six) hours. 5 mL 0   White Petrolatum-Mineral Oil (EYE LUBRICANT) OINT Place 1 tablet into both eyes in  the morning, at noon, and at bedtime.     No current facility-administered medications for this visit.     Past Surgical History:  Procedure Laterality Date   BRAIN SURGERY  11/12   COLONOSCOPY WITH PROPOFOL N/A 09/18/2020   Procedure: COLONOSCOPY WITH PROPOFOL;  Surgeon: Eloise Harman, DO;  Location[facility];  Service: Endoscopy;  Laterality: N/A;  8:30am   LAPAROTOMY Bilateral 07/07/2015   Procedure: EXPLORATORY LAPAROTOMY  LEFT OVARIAN CYSTECTOMY, LEFT PARATUBAL CYSTECTOMY;  Surgeon: Servando Salina, MD;  Location: [facility];  Service: Gynecology;  Laterality: Bilateral;   repair of chiari malformation  05/02   SALPINGOOPHORECTOMY Right 07/07/2015   Procedure: RIGHT SALPINGO OOPHORECTOMY;  Surgeon: Servando Salina, MD;  Location: [facility];  Service:  Gynecology;  Laterality: Right;   tumor removed  Brain, non cancerous   12/2009     Allergies  Allergen Reactions   Enalapril Maleate Hives, Itching and Swelling   Metformin Nausea And Vomiting    abd pain,    Other Itching    vasotec      Family History  Problem Relation Age of Onset   Chiari malformation Mother        requiring surgery    Hypertension Father    Hypertension Sister    Cancer Maternal Grandmother        kidney   Heart disease Maternal Grandmother    Cancer Paternal Grandmother        stomach   Cancer Paternal Grandfather        lung   Colon polyps Neg Hx    Colon cancer Neg Hx      Social History Ms. Spark reports that she has never smoked. She has never used smokeless tobacco. Ms. Heidemann reports no history of alcohol use.   Review of Systems CONSTITUTIONAL: No weight loss, fever, chills, weakness or fatigue.  HEENT: Eyes: No visual loss, blurred vision, double vision or yellow sclerae.No hearing loss, sneezing, congestion, runny nose or sore throat.  SKIN: No rash or itching.  CARDIOVASCULAR: per hpi RESPIRATORY: No shortness of breath, cough or sputum.  GASTROINTESTINAL: No anorexia, nausea, vomiting or diarrhea. No abdominal pain or blood.  GENITOURINARY: No burning on urination, no polyuria NEUROLOGICAL: No headache, dizziness, syncope, paralysis, ataxia, numbness or tingling in the extremities. No change in bowel or bladder control.  MUSCULOSKELETAL: No muscle, back pain, joint pain or stiffness.  LYMPHATICS: No enlarged nodes. No history of splenectomy.  PSYCHIATRIC: No history of depression or anxiety.  ENDOCRINOLOGIC: No reports of sweating, cold or heat intolerance. No polyuria or polydipsia.  Marland Kitchen   Physical Examination Today's Vitals   09/26/21 0819  BP: 130/80  Pulse: 88  SpO2: 94%  Weight: 292 lb 12.8 oz (132.8 kg)  Height: '5\' 5"'$  (1.651 m)   Body mass index is 48.72 kg/m.  Gen: resting comfortably, no acute  distress HEENT: no scleral icterus, pupils equal round and reactive, no palptable cervical adenopathy,  CV Resp: Clear to auscultation bilaterally GI: abdomen is soft, non-tender, non-distended, normal bowel sounds, no hepatosplenomegaly MSK: extremities are warm, no edema.  Skin: warm, no rash Neuro:  no focal deficits Psych: appropriate affect   Diagnostic Studies     Assessment and Plan   1.LE edema/DOE - benign echo, marked diuresis with 50 lbs weight loss since May. Swelling and breathing improved - continue lasix '40mg'$  daily.    2. HTN -at goal continue current meds       Arnoldo Lenis, M.D.

## 2021-09-26 NOTE — Patient Instructions (Signed)

## 2021-10-02 ENCOUNTER — Telehealth: Payer: Self-pay | Admitting: Pulmonary Disease

## 2021-10-02 DIAGNOSIS — G4730 Sleep apnea, unspecified: Secondary | ICD-10-CM

## 2021-10-02 DIAGNOSIS — Z9189 Other specified personal risk factors, not elsewhere classified: Secondary | ICD-10-CM

## 2021-10-02 NOTE — Procedures (Signed)
    Patient Name: Christina Davenport, Christina Davenport Date: 09/25/2021 Gender: Female D.O.B: 1970/07/24 Age (years): 78 Referring Provider: Geraldo Pitter NP Height (inches): 65 Interpreting Physician: Chesley Mires MD, ABSM Weight (lbs): 342 RPSGT: Rosebud Poles BMI: 33 MRN: [id number] Neck Size: 17.50  CLINICAL INFORMATION Sleep Study Type: Split Night CPAP  Indication for sleep study: snoring, sleep disruption, apnea.    Epworth Sleepiness Score: 9  SLEEP STUDY TECHNIQUE As per the AASM Manual for the Scoring of Sleep and Associated Events v2.3 (April 2016) with a hypopnea requiring 4% desaturations.  The channels recorded and monitored were frontal, central and occipital EEG, electrooculogram (EOG), submentalis EMG (chin), nasal and oral airflow, thoracic and abdominal wall motion, anterior tibialis EMG, snore microphone, electrocardiogram, and pulse oximetry. Continuous positive airway pressure (CPAP) was initiated when the patient met split night criteria and was titrated according to treat sleep-disordered breathing.  MEDICATIONS Medications self-administered by patient taken the night of the study : N/A  RESPIRATORY PARAMETERS Diagnostic  Total AHI (/hr): 43.8 RDI (/hr): 44.3 OA Index (/hr): 5.9 CA Index (/hr): 0.0 REM AHI (/hr): 80.0 NREM AHI (/hr): 42.9 Supine AHI (/hr): 35.3 Non-supine AHI (/hr): 46 Min O2 Sat (%): 52.00 Mean O2 (%): 90.27 Time below 88% (min): 29.8   Titration  She was tried on CPAP up to 15 cm H2O.  She continued to have respiratory events with oxygen desaturation.  She had 1.5 to 2 liters supplemental oxygen applied with CPAP.  SLEEP ARCHITECTURE The recording time for the entire night was 411.8 minutes.  During a baseline period of 162.5 minutes, the patient slept for 122.0 minutes in REM and nonREM, yielding a sleep efficiency of 75.1%. Sleep onset after lights out was 8.8 minutes with a REM latency of 88.0 minutes. The patient spent 10.66% of the  night in stage N1 sleep, 64.75% in stage N2 sleep, 22.13% in stage N3 and 2.5% in REM.  During the titration period of 236.8 minutes, the patient slept for 161.5 minutes in REM and nonREM, yielding a sleep efficiency of 68.2%. Sleep onset after CPAP initiation was 20.7 minutes with a REM latency of 34.0 minutes. The patient spent 9.91% of the night in stage N1 sleep, 25.08% in stage N2 sleep, 35.91% in stage N3 and 29.1% in REM.  CARDIAC DATA The 2 lead EKG demonstrated sinus rhythm. The mean heart rate was 74.48 beats per minute. Other EKG findings include: PVCs.  LEG MOVEMENT DATA The total Periodic Limb Movements of Sleep (PLMS) were 0. The PLMS index was 0.00 .  IMPRESSIONS - Severe obstructive sleep apnea with an AHI of 43.8 and SpO2 low of 52%. - She had sub-optimal titration with CPAP and supplemental oxygen.  DIAGNOSIS - Obstructive Sleep Apnea (G47.33) - Nocturnal Hypoxemia (G47.36)  RECOMMENDATIONS - Recommend BiPAP titration given sub-optimal CPAP titration. - Avoid alcohol, sedatives and other CNS depressants that may worsen sleep apnea and disrupt normal sleep architecture. - Sleep hygiene should be reviewed to assess factors that may improve sleep quality. - Weight management and regular exercise should be initiated or continued.  [Electronically signed] 10/02/2021 04:54 PM  Chesley Mires MD, ABSM Diplomate, American Board of Sleep Medicine NPI: [id number]   [place] PH: [phone]   FX: [phone] [place]

## 2021-10-02 NOTE — Telephone Encounter (Signed)
PSG 09/25/21 >> AHI 43.8, SpO2 low 52%.  Sub-optimal titration with CPAP.   Please let her know her sleep study showed severe obstructive sleep apnea.  CPAP was not sufficient to control her sleep apnea.  She should continue using auto Bipap.  Please arrange for overnight oximetry with auto Bipap and get her last 30 days auto Bipap download.  Will let her know if she needs additional sleep testing after these are reviewed.    She is scheduled for ROV with me on 10/15/21.  This can be rescheduled to 3 months unless she would like to keep appointment on 10/15/21.

## 2021-10-03 ENCOUNTER — Telehealth: Payer: Self-pay | Admitting: Pulmonary Disease

## 2021-10-03 NOTE — Telephone Encounter (Signed)
Auto Bipap 09/03/21 to 10/02/21 >> used on 29 of 30 nights with average 6 hrs 48 min.  Average AHI 1.1 with median Bipap 13/9 and 95 th percentile Bipap 14/10 cm H2O.   Please let her know her Bipap report shows great control of her sleep apnea after recent pressure setting changes.  Please make sure she has overnight oximetry scheduled with her using auto Bipap.

## 2021-10-03 NOTE — Telephone Encounter (Signed)
ATC patient. Phone rang for approx. 2 minutes with no vm option "remote access code" ?  Bipap DL report placed on Dr. Juanetta Gosling desk.

## 2021-10-03 NOTE — Telephone Encounter (Signed)
Called patient and went over recommendations with her. Patient was willing to be rescheduled and is agreeable to doing ONO test with Bipap. Patient voiced understanding. She states she is hopeful that she will not need oxygen to sleep again because she did not sleep well with it when she was in the hospital. Patient is aware we will call with ONO results once we receive them. Nothing further needed.

## 2021-10-08 ENCOUNTER — Telehealth: Payer: Self-pay | Admitting: Pulmonary Disease

## 2021-10-08 NOTE — Telephone Encounter (Signed)
Called and spoke to patient and went over that [place] received her order for the ONO and that they should be in contact with her in regards to the test. Patient asked if she could have [facility] number. Gave her the number and she will follow up with them. Nothing further needed

## 2021-10-09 LAB — HM DIABETES EYE EXAM

## 2021-10-15 ENCOUNTER — Ambulatory Visit: Payer: 59 | Admitting: Pulmonary Disease

## 2021-10-22 ENCOUNTER — Telehealth: Payer: Self-pay | Admitting: Pulmonary Disease

## 2021-10-22 NOTE — Telephone Encounter (Signed)
Called and spoke to patient and she states that for now she would like to wait until her appt in October to discuss results and next steps further. She states she is having a hard time with her insurance company and is wanting to wait until at least October to do any more testing. She also states she still is trying to get adjusted to her bipap machine and does not want to pursue oxygen for right now, she is trying to lose weight and is hoping this will also help her. Advised patient to call if she changes her mind or has any further questions before her upcoming appt on 11/29/2021   Routing to Dr. Halford Chessman as an [place]

## 2021-10-22 NOTE — Telephone Encounter (Signed)
Noted  

## 2021-10-22 NOTE — Telephone Encounter (Signed)
ONO with Bipap 10/09/21 >> test time 8 hrs 22 min.  Baseline SpO2 91%, low SpO2 73%.  Spent 1 hr 14 min with SpO2 < 88%.   Please let her know her oxygen level is still low at night with Bipap.  She needs to have an in lab Bipap titration study to determine whether she qualifies to get oxygen set up at night with Bipap.  Please schedule if she is agreeable.  Specify in the order to start the study on Bipap 13/9 cm H2O.

## 2021-10-26 ENCOUNTER — Encounter: Payer: Self-pay | Admitting: Pulmonary Disease

## 2021-11-29 ENCOUNTER — Ambulatory Visit (INDEPENDENT_AMBULATORY_CARE_PROVIDER_SITE_OTHER): Payer: 59 | Admitting: Pulmonary Disease

## 2021-11-29 ENCOUNTER — Encounter: Payer: Self-pay | Admitting: Pulmonary Disease

## 2021-11-29 VITALS — BP 138/92 | HR 86 | Temp 97.6°F | Ht 65.0 in | Wt 291.2 lb

## 2021-11-29 DIAGNOSIS — Z23 Encounter for immunization: Secondary | ICD-10-CM

## 2021-11-29 DIAGNOSIS — J9611 Chronic respiratory failure with hypoxia: Secondary | ICD-10-CM

## 2021-11-29 DIAGNOSIS — J9612 Chronic respiratory failure with hypercapnia: Secondary | ICD-10-CM

## 2021-11-29 DIAGNOSIS — E662 Morbid (severe) obesity with alveolar hypoventilation: Secondary | ICD-10-CM

## 2021-11-29 DIAGNOSIS — G4733 Obstructive sleep apnea (adult) (pediatric): Secondary | ICD-10-CM | POA: Diagnosis not present

## 2021-11-29 NOTE — Patient Instructions (Signed)
Flu shot today Follow up in 1 year 

## 2021-11-29 NOTE — Progress Notes (Signed)
[facility]  Chief Complaint  Patient presents with   Follow-up    Everything going well Last phone note: discuss further sleep testing     Past Surgical History:  She  has a past surgical history that includes repair of chiari malformation (05/02); Brain surgery (11/12); tumor removed (Brain, non cancerous); laparotomy (Bilateral, 07/07/2015); Salpingoophorectomy (Right, 07/07/2015); and Colonoscopy with propofol (N/A, 09/18/2020).  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 female with obstructive sleep apnea, obesity hypoventilation syndrome and chronic respiratory failure.      Subjective:   She has lost about 50 lbs since June 2023.  She has been using an under-the-desk elliptical exercise machine at work.  She is amazed how much better she feels since using Bipap.  She has nasal mask and this is comfortable.  Not having sinus congestion or dry mouth.  Uses saline nasal spray before putting Bipap on.  Physical Exam:   Appearance - well kempt   ENMT - no sinus tenderness, no oral exudate, no LAN, Mallampati 4 airway, no stridor, Lt side facial droop  Respiratory - equal breath sounds bilaterally, no wheezing or rales  CV - s1s2 regular rate and rhythm, no murmurs  Ext - no clubbing, no edema  Skin - no rashes  Psych - normal mood and affect   Pulmonary testing:  ABG 08/01/21 >> pH 7.32, PCO2 76, PO2 75  Sleep Tests:  PSG 09/25/21 >> AHI 43.8, SpO2 low 52% ONO with Bipap 10/09/21 >> test time 8 hrs 22 min.  Baseline SpO2 91%, low SpO2 73%.  Spent 1 hr 14  min with SpO2 < 88%. Auto Bipap 10/30/21 to 11/28/21 >> used on 30 of 30 night with average 7 hrs 38 min.  Average AHI 1 with median Bipap 13/9 and 95 th percentile Bipap 14/10 cm H2O  Cardiac Tests:  Echo 07/27/21 >> EF 55 to 60%, mild LVH, RVSP 40.4 mmHg  Social History:  She  reports that she has never smoked. She has never used smokeless tobacco. She reports that she does not drink alcohol and does not use drugs.  Family History:  Her family history includes Cancer in her maternal grandmother, paternal grandfather, and paternal grandmother; Chiari malformation in her mother; Heart disease in her maternal grandmother; Hypertension in her father and sister.     Assessment/Plan:   Chronic hypoxic and hypercapnic respiratory failure in the setting of obstructive sleep apnea with obesity hypoventilation syndrome. - she is compliant with Bipap and reports benefit from therapy - she uses [employer] for her DME - current Bipap ordered in June 2023 - continue auto Bipap with max IPAP 20, min EPAP 8, PS 4 cm H2O - don't think she will need supplemental oxygen at night since she has lost significant amount of weight - she is hopeful to lose another 50 lbs by Spring 2024  Vaccinations. - influenza vaccine given today  Time Spent Involved in Patient Care on Day of Examination:  26 minutes  Follow up:   Patient Instructions  Flu shot today  Follow up in 1 year  Medication List:   Allergies as of 11/29/2021       Reactions   Enalapril Maleate Hives, Itching, Swelling   Metformin Nausea And Vomiting   abd pain,    Other Itching   vasotec        Medication List        Accurate as of November 29, 2021  8:48 AM. If you have any questions, ask your nurse or doctor.          STOP taking these medications    amLODipine-valsartan 10-320 MG tablet Commonly known as: EXFORGE Stopped by: Chesley Mires, MD   tobramycin 0.3 % ophthalmic solution Commonly known as: TOBREX Stopped  by: Chesley Mires, MD       TAKE these medications    acetaminophen 500 MG tablet Commonly known as: TYLENOL Take 500 mg by mouth every 6 (six) hours as needed for mild pain.   allopurinol 100 MG tablet Commonly known as: ZYLOPRIM Take 100 mg by mouth daily.   carvedilol 6.25 MG tablet Commonly known as: COREG Take 6.25 mg by mouth 2 (two) times daily.   Cholecalciferol 25 MCG (1000 UT) tablet Take 1,000 Units by mouth daily.   Eye Lubricant Oint Place 1 tablet into both eyes in the morning, at noon, and at bedtime.   ferrous sulfate 325 (65 FE) MG EC tablet Take 1 tablet by mouth daily.   furosemide 20 MG tablet Commonly known as: LASIX Take 2 tablets (40 mg total) by mouth daily.   multivitamin tablet Take 1 tablet by mouth daily.   spironolactone 25 MG tablet Commonly known as: Aldactone Take 0.5 tablets (12.5 mg total) by mouth daily.        Signature:  Chesley Mires, MD [place] Pager - [phone] 11/29/2021, 8:48 AM

## 2021-12-19 ENCOUNTER — Ambulatory Visit: Payer: 59 | Admitting: Pulmonary Disease

## 2022-01-27 ENCOUNTER — Other Ambulatory Visit: Payer: Self-pay | Admitting: Family Medicine

## 2022-02-24 ENCOUNTER — Other Ambulatory Visit: Payer: Self-pay | Admitting: Cardiology

## 2022-03-12 ENCOUNTER — Other Ambulatory Visit: Payer: Self-pay | Admitting: Cardiology

## 2022-04-15 ENCOUNTER — Ambulatory Visit: Payer: 59 | Attending: Cardiology | Admitting: Cardiology

## 2022-04-15 ENCOUNTER — Encounter: Payer: Self-pay | Admitting: Cardiology

## 2022-04-15 VITALS — BP 150/90 | HR 86 | Ht 65.0 in | Wt 297.0 lb

## 2022-04-15 DIAGNOSIS — I5032 Chronic diastolic (congestive) heart failure: Secondary | ICD-10-CM | POA: Diagnosis not present

## 2022-04-15 DIAGNOSIS — I10 Essential (primary) hypertension: Secondary | ICD-10-CM | POA: Diagnosis not present

## 2022-04-15 NOTE — Progress Notes (Signed)
Clinical Summary Ms. Rothschild is a 52 y.o.female  1.HTN -she reports component of white coat HTN - recent normal renin/aldo ratio She has OSA on cpap  - home bp's 120s/70s - compliant with meds     2. Chronic HFpEF - at prior visit she was signficiantly volume overloaded, SOB/DOE. Weight had gone up from 307 last year to 344 lbs.  - changed to lasix 73m daily with significant diuresis - had AKI during hospital admission with diarrhea, lasix lowered to 479mdaily  07/2021 echo: LVEF 55-60%, mild LVH, normal diastolic function, normal RV, PASP 40 -using elliptical machine up to 4.7  miles a day.    - at our 09/2021 visit she was 292 lbs. Reports has gained 5 lbs over the holidays she attibributes to diet, no recent edema.  - Jan 2024 Cr 1.25 - Takes lasix 4080maily.      3. CKD - Jan 2024 Cr 1.25 - followed by Dr BhuTheador Hawthorne- admit 07/2021 AKI on CKD in setting of diarrhea - f/u labs showed Cr back to 1.19   4. OSA  - using cpap  5. 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    Past Medical History:  Diagnosis Date   [place] (benign) [place]   excision followed by radiation   CHF (congestive heart failure) ([facility]  [facility]reesia 12/20/2019, patient denies today 08/08/20   Depression, major, single episode, moderate ([facility]/31/2021   PHQ 9 score of 12 in 11/2019   Diabetes mellitus type 2 in obese ([facility]/14/2009   Qualifier: Diagnosis of  By: SimMoshe Cipro, Margaret     Diabetes mellitus without complication ([facility]  Phreesia 12/20/2019   Diabetes mellitus, type 2 ([facility] [place]  diet controlled   Endometrial thickening on ultra sound 01/08/2012   Reports had pelvic exam in 2015 will send for record    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   History of brain tumor 12/28/2015   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     allopurinol (ZYLOPRIM) 100 MG tablet Take 100 mg by mouth daily.     carvedilol (COREG) 6.25 MG tablet Take 6.25 mg by mouth 2 (two) times daily.     Cholecalciferol 25 MCG (1000 UT) tablet Take 1,000 Units by mouth daily.     ferrous sulfate 325 (65 FE) MG EC tablet Take 1 tablet by mouth daily.     furosemide (LASIX) 20 MG tablet Take 2 tablets (40 mg total) by mouth daily. 60 tablet 6   Multiple Vitamin (MULTIVITAMIN) tablet Take 1 tablet by mouth daily.     spironolactone (ALDACTONE) 25 MG tablet TAKE 1/2 TABLET(12.5 MG) BY MOUTH DAILY 45 tablet 3   White Petrolatum-Mineral Oil (EYE LUBRICANT) OINT Place  1 tablet into both eyes in the morning, at noon, and at bedtime.     No current facility-administered medications for this visit.     Past Surgical History:  Procedure Laterality Date   BRAIN SURGERY  11/12   COLONOSCOPY WITH PROPOFOL N/A 09/18/2020   Procedure: COLONOSCOPY WITH PROPOFOL;  Surgeon: Eloise Harman, DO;  [facility];  Service: Endoscopy;  Laterality: N/A;  8:30am   LAPAROTOMY Bilateral 07/07/2015   Procedure: EXPLORATORY LAPAROTOMY  LEFT OVARIAN CYSTECTOMY, LEFT PARATUBAL CYSTECTOMY;  Surgeon: Servando Salina, MD;  Location: [facility];  Service: Gynecology;  Laterality: Bilateral;   repair of chiari malformation  05/02   SALPINGOOPHORECTOMY Right 07/07/2015   Procedure: RIGHT SALPINGO OOPHORECTOMY;  Surgeon: Servando Salina, MD;  Location: [facility];  Service: Gynecology;  Laterality: Right;    tumor removed  Brain, non cancerous   12/2009     Allergies  Allergen Reactions   Enalapril Maleate Hives, Itching and Swelling   Metformin Nausea And Vomiting    abd pain,    Other Itching    vasotec      Family History  Problem Relation Age of Onset   Chiari malformation Mother        requiring surgery    Hypertension Father    Hypertension Sister    Cancer Maternal Grandmother        kidney   Heart disease Maternal Grandmother    Cancer Paternal Grandmother        stomach   Cancer Paternal Grandfather        lung   Colon polyps Neg Hx    Colon cancer Neg Hx      Social History Ms. Ballas reports that she has never smoked. She has never used smokeless tobacco. Ms. Gervasio reports no history of alcohol use.   Review of Systems CONSTITUTIONAL: No weight loss, fever, chills, weakness or fatigue.  HEENT: Eyes: No visual loss, blurred vision, double vision or yellow sclerae.No hearing loss, sneezing, congestion, runny nose or sore throat.  SKIN: No rash or itching.  CARDIOVASCULAR: per hpi RESPIRATORY: No shortness of breath, cough or sputum.  GASTROINTESTINAL: No anorexia, nausea, vomiting or diarrhea. No abdominal pain or blood.  GENITOURINARY: No burning on urination, no polyuria NEUROLOGICAL: No headache, dizziness, syncope, paralysis, ataxia, numbness or tingling in the extremities. No change in bowel or bladder control.  MUSCULOSKELETAL: No muscle, back pain, joint pain or stiffness.  LYMPHATICS: No enlarged nodes. No history of splenectomy.  PSYCHIATRIC: No history of depression or anxiety.  ENDOCRINOLOGIC: No reports of sweating, cold or heat intolerance. No polyuria or polydipsia.  Marland Kitchen   Physical Examination Today's Vitals   04/15/22 0830 04/15/22 0844  BP: (!) 166/90 (!) 150/90  Pulse: 86   SpO2: 92%   Weight: 297 lb (134.7 kg)   Height: 5' 5"$  (1.651 m)    Body mass index is 49.42 kg/m.  Gen: resting comfortably, no acute distress HEENT: no  scleral icterus, pupils equal round and reactive, no palptable cervical adenopathy,  CV: RRR, no m/r/g, no jvd Resp: Clear to auscultation bilaterally GI: abdomen is soft, non-tender, non-distended, normal bowel sounds, no hepatosplenomegaly MSK: extremities are warm, no edema.  Skin: warm, no rash Neuro:  no focal deficits Psych: appropriate affect   Diagnostic Studies     Assessment and Plan   1.Chronic HFpEF - euvolemic today, has done very well on lasix 12m daily.  - continue current meds   2. HTN -pattern of white  coat HTN - bp's elevated here, home numbers well controlled. Continue current meds   F/u 6 months     Arnoldo Lenis, M.D

## 2022-04-15 NOTE — Patient Instructions (Signed)
Medication Instructions:  Continue all current medications.   Labwork: none  Testing/Procedures: none  Follow-Up: 6 months   Any Other Special Instructions Will Be Listed Below (If Applicable).   If you need a refill on your cardiac medications before your next appointment, please call your pharmacy.  

## 2022-08-05 ENCOUNTER — Other Ambulatory Visit: Payer: Self-pay | Admitting: Cardiology

## 2022-09-04 ENCOUNTER — Other Ambulatory Visit: Payer: Self-pay | Admitting: Cardiology

## 2022-10-17 ENCOUNTER — Telehealth: Payer: Self-pay | Admitting: Cardiology

## 2022-10-17 ENCOUNTER — Ambulatory Visit: Payer: 59 | Admitting: Cardiology

## 2022-10-17 MED ORDER — SPIRONOLACTONE 25 MG PO TABS: 12.5000 mg | ORAL_TABLET | Freq: Every day | ORAL | 1 refills | Status: DC

## 2022-10-17 MED ORDER — FUROSEMIDE 20 MG PO TABS: 40.0000 mg | ORAL_TABLET | Freq: Every day | ORAL | 1 refills | Status: DC

## 2022-10-17 NOTE — Telephone Encounter (Signed)
    1. Which medications need to be refilled? (please list name of each medication and dose if known) lASIX 20 MG & Aldactone 25 mg    2. Would you like to learn more about the convenience, safety, & potential cost savings by using the [facility]? no   3. Are you open to using the [facility] (Type [facility]. no   4. Which pharmacy/location (including street and city if local pharmacy) is medication to be sent to?[facility] [place]   5. Do they need a 30 day or 90 day supply? 90   Patient had appointment today with Dr. Wyline Mood. She needs refills until she can get another appointment.

## 2022-10-17 NOTE — Telephone Encounter (Signed)
Done

## 2022-12-05 ENCOUNTER — Ambulatory Visit: Payer: 59 | Attending: Nurse Practitioner | Admitting: Nurse Practitioner

## 2022-12-05 ENCOUNTER — Encounter: Payer: Self-pay | Admitting: Nurse Practitioner

## 2022-12-05 VITALS — BP 142/80 | HR 71 | Ht 65.0 in | Wt 318.6 lb

## 2022-12-05 DIAGNOSIS — I10 Essential (primary) hypertension: Secondary | ICD-10-CM | POA: Diagnosis not present

## 2022-12-05 DIAGNOSIS — I5032 Chronic diastolic (congestive) heart failure: Secondary | ICD-10-CM | POA: Diagnosis not present

## 2022-12-05 DIAGNOSIS — G4733 Obstructive sleep apnea (adult) (pediatric): Secondary | ICD-10-CM | POA: Diagnosis not present

## 2022-12-05 DIAGNOSIS — N183 Chronic kidney disease, stage 3 unspecified: Secondary | ICD-10-CM | POA: Diagnosis not present

## 2022-12-05 MED ORDER — FUROSEMIDE 20 MG PO TABS: 40.0000 mg | ORAL_TABLET | Freq: Every day | ORAL | 1 refills | Status: DC

## 2022-12-05 MED ORDER — SPIRONOLACTONE 25 MG PO TABS: 12.5000 mg | ORAL_TABLET | Freq: Every day | ORAL | 1 refills | Status: DC

## 2022-12-05 NOTE — Patient Instructions (Addendum)
Medication Instructions:  Your physician has recommended you make the following change in your medication:  Please increase Lasix to 40 Mg daily with 1 extra 20 Mg tablet as needed. Continue all other medications as prescribed.   Labwork: None  Testing/Procedures: None  Follow-Up: Your physician recommends that you schedule a follow-up appointment in: 6 Months   Any Other Special Instructions Will Be Listed Below (If Applicable).  If you need a refill on your cardiac medications before your next appointment, please call your pharmacy.

## 2022-12-05 NOTE — Progress Notes (Signed)
Cardiology Office Note:  .   Date:  12/05/2022  ID:  Christina Davenport, DOB 11-Feb-1971, MRN [id number] PCP: Lovey Newcomer, PA  [facility] Providers Cardiologist:  Dina Rich, MD    History of Present Illness: .   Christina Davenport is a 52 y.o. female with a PMH of chronic HFpEF, HTN, CKD (follows Dr. Wolfgang Phoenix), OHS and OSA on CPAP, who presents today for 6 month follow-up.   Last seen by Dr. Dina Rich on April 15, 2022.  Was doing well at the time.  Blood pressure was elevated in office, felt to be component whitecoat hypertension as home BP was well controlled.   Today she presents for 6 month follow-up. She states she has been having issues with her mattress at home, this is caused back pain.  Due to this, she has had treatment for this, has noticed more leg swelling and weight gain.  She has been taking Lasix that has helped her lose weight.  Says she is going to start a more consistent exercise regimen soon. Overall doing well from a cardiac perspective.  Denies any chest pain, shortness of breath, palpitations, syncope, presyncope, dizziness, orthopnea, PND, acute bleeding, or claudication.   ROS: Negative.  See HPI.  Studies Reviewed: Marland Kitchen    EKG:  EKG Interpretation Date/Time:  Thursday December 05 2022 08:47:08 EDT Ventricular Rate:  84 PR Interval:  154 QRS Duration:  76 QT Interval:  340 QTC Calculation: 401 R Axis:   -41  Text Interpretation: Normal sinus rhythm Left axis deviation Low voltage QRS Possible Anterolateral infarct (cited on or before 03-Jul-2015) When compared with ECG of 12-Sep-2020 08:19, No significant change was found Confirmed by Sharlene Dory [phone]) on 12/05/2022 8:49:59 AM   Echo 07/2021: 1. Left ventricular ejection fraction, by estimation, is 55 to 60%. The  left ventricle has normal function. The left ventricle has no regional  wall motion abnormalities. There is mild left ventricular hypertrophy.  Left ventricular  diastolic parameters  were normal.   2. Right ventricular systolic function is normal. The right ventricular  size is normal. There is mildly elevated pulmonary artery systolic  pressure. The estimated right ventricular systolic pressure is 40.4 mmHg.   3. Right atrial size was mildly dilated.   4. The mitral valve is normal in structure. Trivial mitral valve  regurgitation.   5. The aortic valve is tricuspid. Aortic valve regurgitation is not  visualized. No aortic stenosis is present.   6. The inferior vena cava is dilated in size with <50% respiratory  variability, suggesting right atrial pressure of 15 mmHg.  Risk Assessment/Calculations:    HYPERTENSION CONTROL Vitals:   12/05/22 0838 12/05/22 0844 12/05/22 0855  BP: (!) 138/110 (!) 138/110 (!) 142/80    The patient's blood pressure is elevated above target today.  In order to address the patient's elevated BP: The blood pressure is usually elevated in clinic.  Blood pressures monitored at home have been optimal.; Blood pressure will be monitored at home to determine if medication changes need to be made.; Follow up with general cardiology has been recommended.          Physical Exam:   VS:  BP (!) 142/80 (BP Location: Right Arm, Patient Position: Sitting, Cuff Size: Normal) Comment (BP Location): Right forearm  Pulse 71   Ht 5\' 5"  (1.651 m)   Wt (!) 318 lb 9.6 oz (144.5 kg)   SpO2 97%   BMI 53.02 kg/m    Wt  Readings from Last 3 Encounters:  12/05/22 (!) 318 lb 9.6 oz (144.5 kg)  04/15/22 297 lb (134.7 kg)  11/29/21 291 lb 3.2 oz (132.1 kg)    GEN: Morbidly obese, 52 y.o. female in no acute distress NECK: No JVD; No carotid bruits CARDIAC: S1/S2, RRR, no murmurs, rubs, gallops RESPIRATORY:  Clear and diminished to auscultation without rales, wheezing or rhonchi  EXTREMITIES:  Nonpitting edema; No deformity   ASSESSMENT AND PLAN: .    Chronic HFpEF Stage C, NYHA class I symptoms. EF 07/2021 55-60%.  Weight has been  up due to limited mobility related to back pain.  Has been taking Lasix and has noticed weight loss with this.  Continue carvedilol, Lasix, and spironolactone.  Will provide refills per her request.  Instructed to take Lasix 40 mg daily and stated she may take an extra 20 mg tablet as needed daily for leg swelling or weight gain.  She verbalized understanding. Low sodium diet, fluid restriction <2L, and daily weights encouraged. Educated to contact our office for weight gain of 2 lbs overnight or 5 lbs in one week.   2. HTN Blood pressure elevated in all this today, home BPs are at goal.  Patient states she has whitecoat hypertension. Given BP log and salty six diet sheet. Discussed to monitor BP at home at least 2 hours after medications and sitting for 5-10 minutes. Continue Davenport medication regimen. Heart healthy diet and regular cardiovascular exercise encouraged.   3. CKD stage 3 Most recent serum creatinine was 1.43 with eGFR 45.  Avoid nephrotoxic agents.  No medication changes at this time.  Continue follow-up with PCP and nephrology (she follows Dr. Wolfgang Phoenix).  4. OSA on CPAP Encouraged continued compliance.  5. Morbid obesity Weight loss via diet and exercise encouraged. Discussed the impact being overweight would have on cardiovascular risk. Given information regarding PREP program. She will let us know if interested and will place referral. Verbalized understanding.    Dispo: Will request most recent labs from PCP's office.  Will provide refills per her request. Follow-up with Dr. Dina Rich or APP in 6 months or sooner anything changes.  Signed, Sharlene Dory, NP

## 2023-02-11 IMAGING — US US RENAL
1 series · 14 of 25 positions shown · non-contrast
Comparison: Renal sonogram dated July 04, 2021

CLINICAL DATA: Acute kidney injury.

EXAM:
RENAL / URINARY TRACT ULTRASOUND COMPLETE

[Series 1: us renal · 80 acquisitions, 14 frames shown]
[im 1/80]
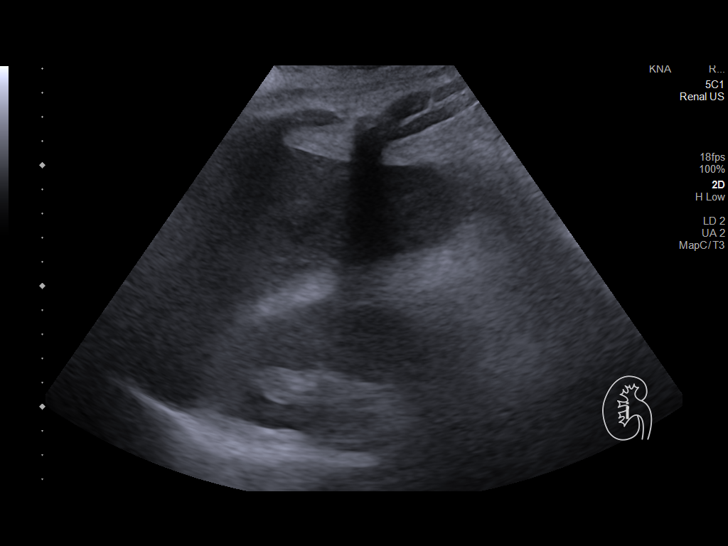
[im 7/80]
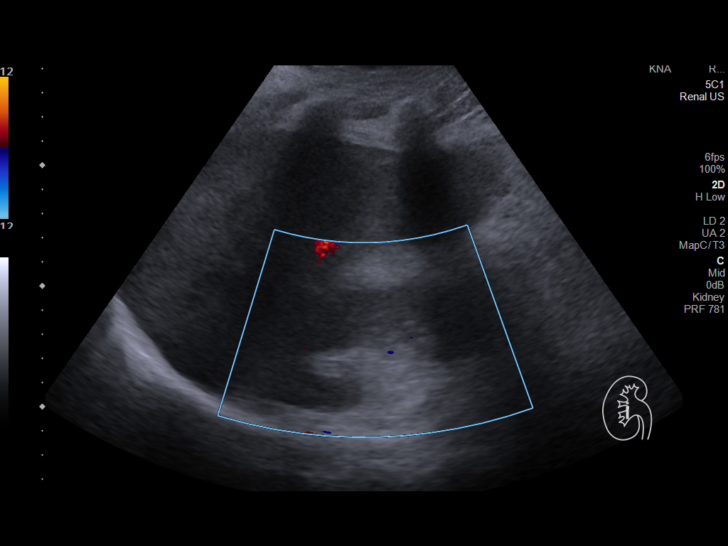
[im 14/80]
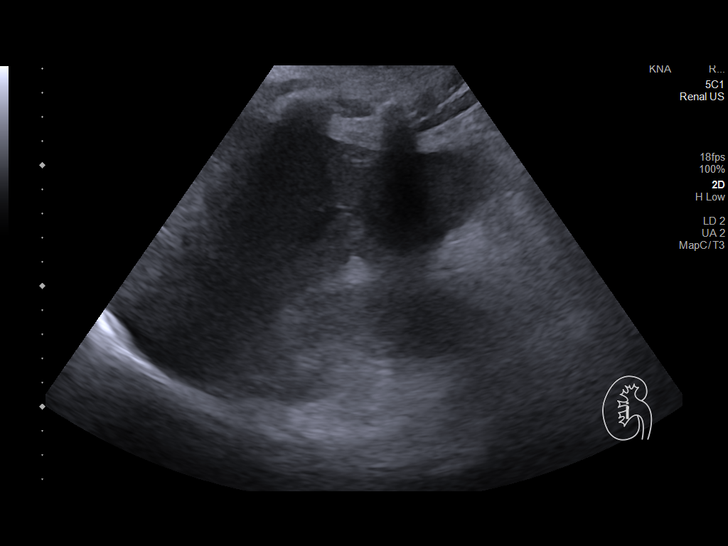
[im 20/80]
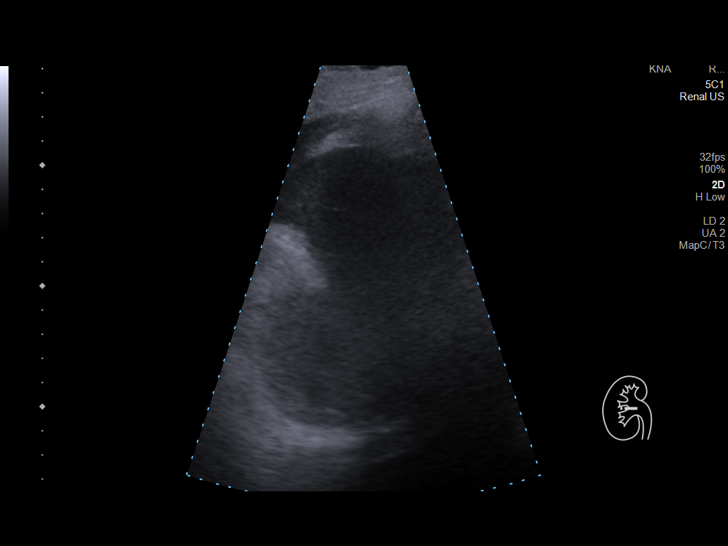
[im 27/80]
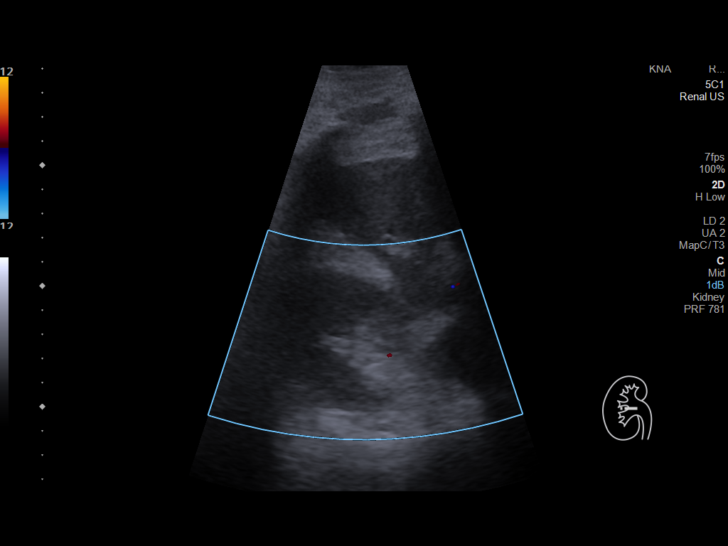
[im 30/80]
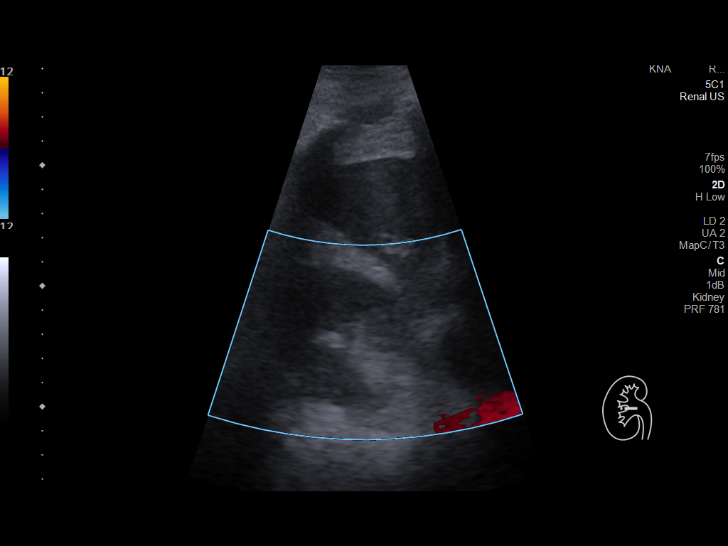
[im 37/80]
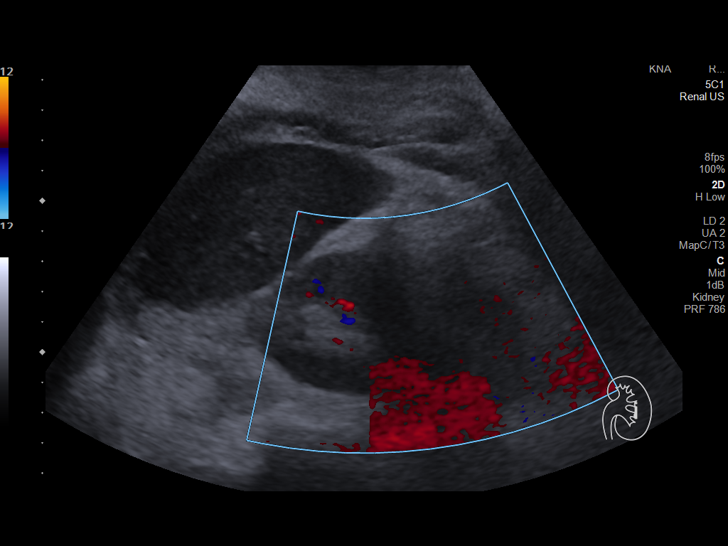
[im 43/80]
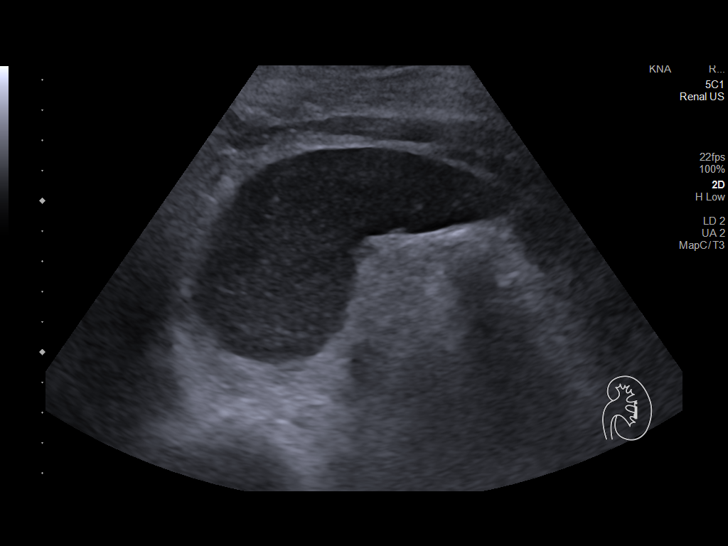
[im 50/80]
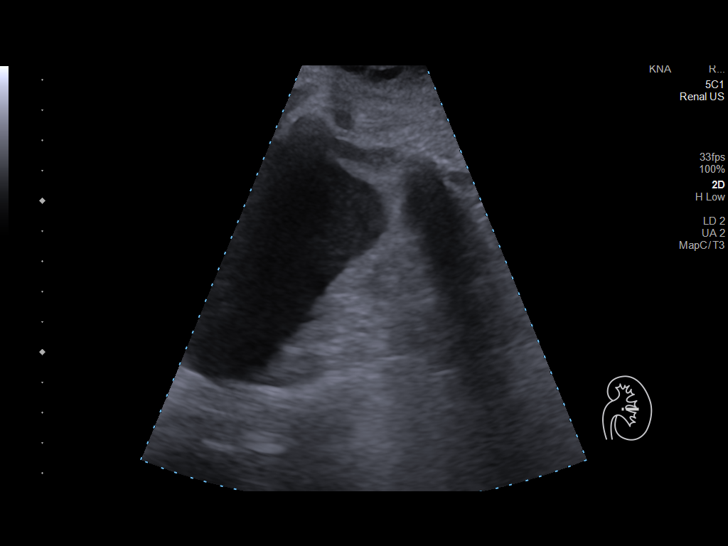
[im 53/80]
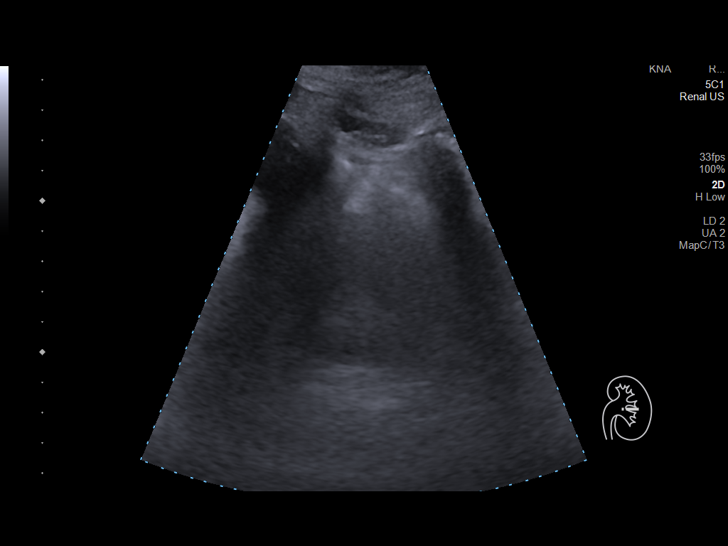
[im 60/80]
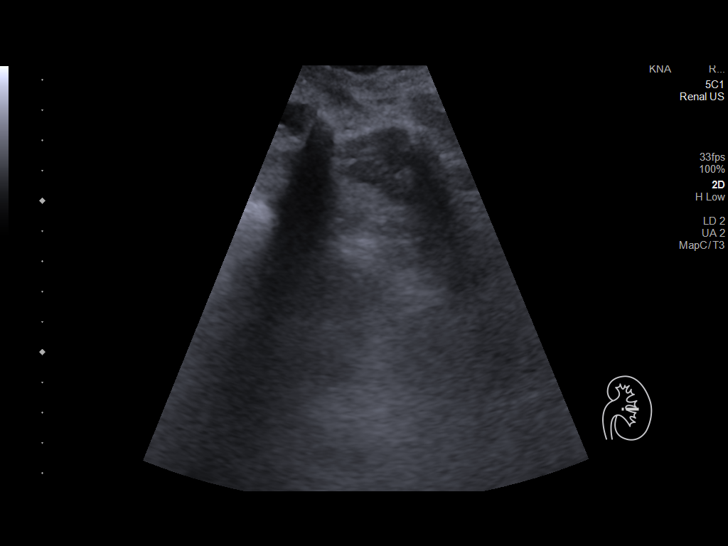
[im 66/80]
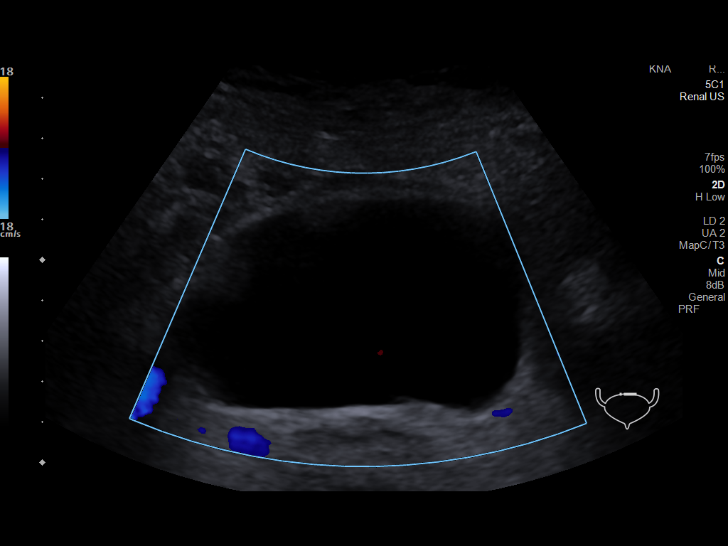
[im 73/80]
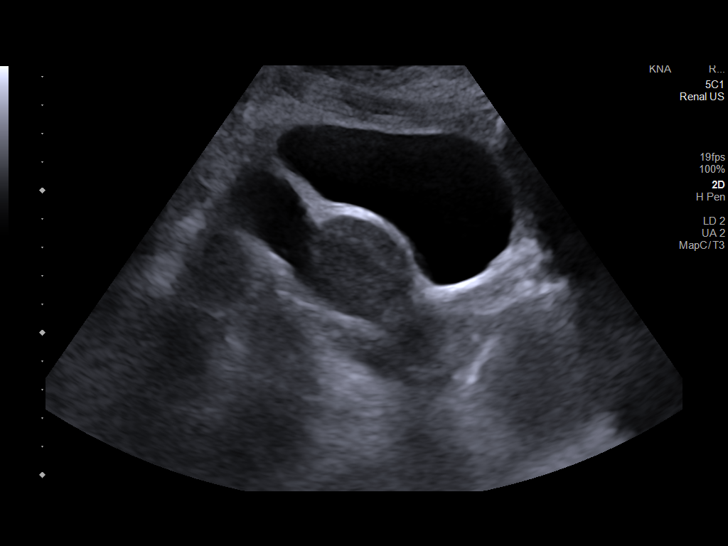
[im 80/80]
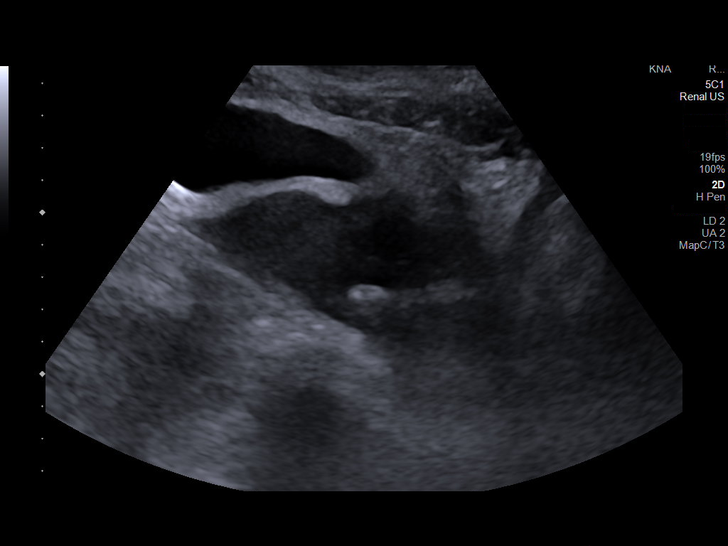

[14 of 25 positions shown; findings below may reference images not displayed]

FINDINGS: Right Kidney:

Renal measurements: 10.5 x 5.4 x 5.3 = volume: 156 mL. Echogenicity
within normal limits. No mass or hydronephrosis visualized.

Left Kidney:

Renal measurements: 9.7 x 5.3 x 4.8 = volume: 130 mL. Echogenicity
within normal limits. No mass or hydronephrosis visualized.

Bladder:

Appears normal for degree of bladder distention.

Other:

Complex avascular masslike structure abutting the superior aspect of
the uterus measuring at least 7.4 x 5.4 x 8.2 cm.
IMPRESSION: 1. evidence of nephrolithiasis or hydronephrosis. Limited evaluation
due to bowel gas shadowing.

2. Complex avascular mass abutting the superior aspect of the uterus
measuring at least 7.4 x 5.4 x 8.2 cm, this may be uterine or
adnexal region, further evaluation with pelvic sonogram would be
helpful.

## 2023-06-05 ENCOUNTER — Encounter: Payer: Self-pay | Admitting: Nephrology

## 2023-06-05 ENCOUNTER — Ambulatory Visit: Payer: 59 | Attending: Nurse Practitioner | Admitting: Nurse Practitioner

## 2023-06-05 ENCOUNTER — Encounter: Payer: Self-pay | Admitting: Nurse Practitioner

## 2023-06-05 VITALS — BP 140/80 | HR 86 | Ht 65.0 in | Wt 312.0 lb

## 2023-06-05 DIAGNOSIS — N183 Chronic kidney disease, stage 3 unspecified: Secondary | ICD-10-CM | POA: Diagnosis not present

## 2023-06-05 DIAGNOSIS — I10 Essential (primary) hypertension: Secondary | ICD-10-CM | POA: Diagnosis not present

## 2023-06-05 DIAGNOSIS — I5032 Chronic diastolic (congestive) heart failure: Secondary | ICD-10-CM | POA: Diagnosis not present

## 2023-06-05 DIAGNOSIS — G4733 Obstructive sleep apnea (adult) (pediatric): Secondary | ICD-10-CM | POA: Diagnosis not present

## 2023-06-05 MED ORDER — CARVEDILOL 6.25 MG PO TABS: 6.2500 mg | ORAL_TABLET | Freq: Two times a day (BID) | ORAL | 1 refills | Status: AC

## 2023-06-05 MED ORDER — AMLODIPINE BESYLATE 5 MG PO TABS: 5.0000 mg | ORAL_TABLET | Freq: Every day | ORAL | 1 refills | Status: DC

## 2023-06-05 MED ORDER — FUROSEMIDE 20 MG PO TABS: 40.0000 mg | ORAL_TABLET | Freq: Every day | ORAL | 1 refills | Status: DC

## 2023-06-05 MED ORDER — VALSARTAN 40 MG PO TABS: 20.0000 mg | ORAL_TABLET | Freq: Every day | ORAL | 1 refills | Status: DC

## 2023-06-05 MED ORDER — SPIRONOLACTONE 25 MG PO TABS: 12.5000 mg | ORAL_TABLET | Freq: Every day | ORAL | 1 refills | Status: DC

## 2023-06-05 NOTE — Patient Instructions (Addendum)

## 2023-06-05 NOTE — Progress Notes (Unsigned)
 Cardiology Office Note:  .   Date:  06/05/2023 ID:  Salvadore Dom, DOB 1971/02/20, MRN [id number] PCP: Lovey Newcomer, PA  [facility] Providers Cardiologist:  Dina Rich, MD    History of Present Illness: .   Christina Davenport is a 53 y.o. female with a PMH of chronic HFpEF, HTN, CKD (follows Dr. Wolfgang Phoenix), OHS and OSA on CPAP, who presents today for 6 month follow-up.   Last seen by Dr. Dina Rich on April 15, 2022.  Was doing well at the time.  Blood pressure was elevated in office, felt to be component whitecoat hypertension as home BP was well controlled.   12/05/2023 - Today she presents for 6 month follow-up. She states she has been having issues with her mattress at home, this is caused back pain.  Due to this, she has had treatment for this, has noticed more leg swelling and weight gain.  She has been taking Lasix that has helped her lose weight.  Says she is going to start a more consistent exercise regimen soon. Overall doing well from a cardiac perspective.  Denies any chest pain, shortness of breath, palpitations, syncope, presyncope, dizziness, orthopnea, PND, acute bleeding, or claudication.  06/05/2023 - Today she presents for follow-up.  She is doing well and denies any acute cardiac plaints or issues.  Tells me she has been previously diagnosed with whitecoat syndrome.  BPs tend to run a little higher in the office and her home readings.  Shows me her BP readings from home that are overall well-controlled.  Most recent SBP 120's. Denies any chest pain, shortness of breath, palpitations, syncope, presyncope, dizziness, orthopnea, PND, swelling or significant weight changes, acute bleeding, or claudication.   ROS: Negative.  See HPI.  Studies Reviewed: Marland Kitchen    EKG: EKG is not ordered today.     Echo 07/2021: 1. Left ventricular ejection fraction, by estimation, is 55 to 60%. The  left ventricle has normal function. The left ventricle has no regional   wall motion abnormalities. There is mild left ventricular hypertrophy.  Left ventricular diastolic parameters  were normal.   2. Right ventricular systolic function is normal. The right ventricular  size is normal. There is mildly elevated pulmonary artery systolic  pressure. The estimated right ventricular systolic pressure is 40.4 mmHg.   3. Right atrial size was mildly dilated.   4. The mitral valve is normal in structure. Trivial mitral valve  regurgitation.   5. The aortic valve is tricuspid. Aortic valve regurgitation is not  visualized. No aortic stenosis is present.   6. The inferior vena cava is dilated in size with <50% respiratory  variability, suggesting right atrial pressure of 15 mmHg.      Physical Exam:   VS:  BP (!) 140/80   Pulse 86   Ht 5\' 5"  (1.651 m)   Wt (!) 312 lb (141.5 kg)   SpO2 100%   BMI 51.92 kg/m    Wt Readings from Last 3 Encounters:  06/05/23 (!) 312 lb (141.5 kg)  12/05/22 (!) 318 lb 9.6 oz (144.5 kg)  04/15/22 297 lb (134.7 kg)    GEN: Morbidly obese, 53 y.o. female in no acute distress NECK: No JVD; No carotid bruits CARDIAC: S1/S2, RRR, no murmurs, rubs, gallops RESPIRATORY:  Clear and diminished to auscultation without rales, wheezing or rhonchi  EXTREMITIES:  Nonpitting edema; No deformity   ASSESSMENT AND PLAN: .    Chronic HFpEF Stage C, NYHA class I symptoms.  EF 07/2021 55-60%.  Weight has been stable recently.  Difficult to ascertain volume status due to her BMI.  Does not appear volume overloaded or show signs of heart failure exacerbation.  Continue current medication regimen.  Will provide refills per her request. Low sodium diet, fluid restriction <2L, and daily weights encouraged. Educated to contact our office for weight gain of 2 lbs overnight or 5 lbs in one week.  2. HTN Blood pressure elevated office, has better BP readings at home, most recent BP well-controlled.  She has whitecoat hypertension. Given BP log and salty six  diet sheet. Discussed to monitor BP at home at least 2 hours after medications and sitting for 5-10 minutes.  Discussed to contact office if SBP is consistently greater than 130 at home, she verbalized understanding.  Continue current medication regimen. Heart healthy diet and regular cardiovascular exercise encouraged.   3. CKD stage 3 Will request most recent labs from her kidney doctor and PCP.  Avoid nephrotoxic agents.  No medication changes at this time.  Continue follow-up with PCP and nephrology (she follows Dr. Wolfgang Phoenix).  4. OSA on CPAP Encouraged continued compliance.  5. Morbid obesity Weight loss via diet and exercise encouraged. Discussed the impact being overweight would have on cardiovascular risk. Given information regarding [facility].   Dispo: Will request most recent labs.  Will provide refills per her request. Follow-up with Dr. Dina Rich or APP in 6 months or sooner anything changes.  Signed, Sharlene Dory, NP

## 2023-09-22 ENCOUNTER — Telehealth: Payer: Self-pay | Admitting: *Deleted

## 2023-09-22 NOTE — Telephone Encounter (Signed)
 Copied from CRM [id number]. Topic: Appointments - Scheduling Inquiry for Clinic >> Sep 22, 2023  8:27 AM Corean SAUNDERS wrote: Reason for CRM:  Please call patient back to schedule her annual Bipap follow up as she used to see Dr. Shellia but is willing to see an APP however [facility] is not showing openings for any APP's at [facility]. Patient is attempting to be seen in August as she is going on a cruise and is trying to avoid running out of supplies while on [place].  Patient is scheduled with Dr. Alghnaim on Monday 11/24/23 at 8:30 am---will mail information to patient and she voiced her understanding

## 2023-11-03 ENCOUNTER — Other Ambulatory Visit: Payer: Self-pay

## 2023-11-03 DIAGNOSIS — I5032 Chronic diastolic (congestive) heart failure: Secondary | ICD-10-CM

## 2023-11-03 DIAGNOSIS — I10 Essential (primary) hypertension: Secondary | ICD-10-CM

## 2023-11-03 MED ORDER — AMLODIPINE BESYLATE 5 MG PO TABS: 5.0000 mg | ORAL_TABLET | Freq: Every day | ORAL | 2 refills | Status: AC

## 2023-11-05 MED ORDER — FUROSEMIDE 20 MG PO TABS: 40.0000 mg | ORAL_TABLET | Freq: Every day | ORAL | 2 refills | Status: AC

## 2023-11-05 MED ORDER — VALSARTAN 40 MG PO TABS: 20.0000 mg | ORAL_TABLET | Freq: Every day | ORAL | 2 refills | Status: AC

## 2023-11-05 MED ORDER — SPIRONOLACTONE 25 MG PO TABS: 12.5000 mg | ORAL_TABLET | Freq: Every day | ORAL | 2 refills | Status: AC

## 2023-11-24 ENCOUNTER — Ambulatory Visit: Admitting: Pulmonary Disease

## 2024-01-18 NOTE — Progress Notes (Unsigned)
 New Patient Pulmonology Office Visit   Subjective:  Patient ID: Christina Davenport, female    DOB: 09-07-1970  MRN: [id number]  Referred by: Jolee Elsie RAMAN, PA  CC: No chief complaint on file.   HPI Christina Davenport is a 53 y.o. female with hx of OHS, morbid obesity, and chronic respiratory failure who presents to establish care.  Last seen by Dr. Shellia on 2023  {PULM QUESTIONNAIRES (Optional):33196}  ROS  Allergies: Enalapril maleate, Metformin , and Other  Current Outpatient Medications:    acetaminophen  (TYLENOL ) 500 MG tablet, Take 500 mg by mouth every 6 (six) hours as needed for mild pain., Disp: , Rfl:    allopurinol  (ZYLOPRIM ) 100 MG tablet, Take 100 mg by mouth daily., Disp: , Rfl:    amLODipine  (NORVASC ) 5 MG tablet, Take 1 tablet (5 mg total) by mouth daily., Disp: 90 tablet, Rfl: 2   carvedilol  (COREG ) 6.25 MG tablet, Take 1 tablet (6.25 mg total) by mouth 2 (two) times daily., Disp: 180 tablet, Rfl: 1   Cholecalciferol 25 MCG (1000 UT) tablet, Take 1,000 Units by mouth daily., Disp: , Rfl:    ferrous sulfate  325 (65 FE) MG EC tablet, Take 1 tablet by mouth daily., Disp: , Rfl:    furosemide  (LASIX ) 20 MG tablet, Take 2 tablets (40 mg total) by mouth daily. With 1 extra 20 Mg tablet daily as needed, Disp: 270 tablet, Rfl: 2   ibuprofen  (ADVIL ) 800 MG tablet, Take 800 mg by mouth every 8 (eight) hours as needed. (Patient not taking: Reported on 06/05/2023), Disp: , Rfl:    Multiple Vitamin (MULTIVITAMIN) tablet, Take 1 tablet by mouth daily., Disp: , Rfl:    spironolactone  (ALDACTONE ) 25 MG tablet, Take 0.5 tablets (12.5 mg total) by mouth daily., Disp: 45 tablet, Rfl: 2   valsartan  (DIOVAN ) 40 MG tablet, Take 0.5 tablets (20 mg total) by mouth daily., Disp: 45 tablet, Rfl: 2   White Petrolatum-Mineral Oil (EYE LUBRICANT) OINT, Place 1 tablet into both eyes in the morning, at noon, and at bedtime., Disp: , Rfl:  Past Medical History:  Diagnosis Date   Brain tumor  (benign) (HCC) 2011   excision followed by radiation   CHF (congestive heart failure) (HCC)    Phreesia 12/20/2019, patient denies today 08/08/20   Depression, major, single episode, moderate (HCC) 12/26/2019   PHQ 9 score of 12 in 11/2019   Diabetes mellitus type 2 in obese 12/09/2007   Qualifier: Diagnosis of  By: Antonetta MD, Margaret     Diabetes mellitus without complication (HCC)    Phreesia 12/20/2019   Diabetes mellitus, type 2 (HCC)    diet controlled   Endometrial thickening on ultra sound 01/08/2012   Reports had pelvic exam in 2015 will send for record    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   History of brain tumor 12/28/2015   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       Hypertension    Obesity    Ovarian cyst 01/08/2012   PCO (polycystic ovaries)    S/P right oophorectomy 07/07/2015   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   Past Surgical History:  Procedure Laterality Date   BRAIN SURGERY  11/12   COLONOSCOPY WITH PROPOFOL  N/A 09/18/2020   Procedure: COLONOSCOPY WITH PROPOFOL ;  Surgeon: Cindie Carlin POUR, DO;  Location: [facility];  Service: Endoscopy;  Laterality: N/A;  8:30am   LAPAROTOMY Bilateral 07/07/2015   Procedure: EXPLORATORY LAPAROTOMY  LEFT OVARIAN CYSTECTOMY, LEFT PARATUBAL CYSTECTOMY;  Surgeon: Dickie Carder, MD;  Location: [facility];  Service: Gynecology;  Laterality: Bilateral;   repair of chiari malformation  05/02   SALPINGOOPHORECTOMY Right 07/07/2015   Procedure: RIGHT SALPINGO OOPHORECTOMY;  Surgeon: Dickie Carder, MD;  Location: [facility];  Service: Gynecology;  Laterality: Right;   tumor removed  Brain, non cancerous   12/2009   Family History  Problem Relation Age of Onset   Chiari malformation Mother        requiring surgery    Hypertension Father    Hypertension Sister    Cancer Maternal Grandmother        kidney   Heart disease Maternal Grandmother    Cancer  Paternal Grandmother        stomach   Cancer Paternal Grandfather        lung   Colon polyps Neg Hx    Colon cancer Neg Hx    Social History   Socioeconomic History   Marital status: Single    Spouse name: Not on file   Number of children: Not on file   Years of education: Not on file   Highest education level: Not on file  Occupational History   Not on file  Tobacco Use   Smoking status: Never   Smokeless tobacco: Never  Vaping Use   Vaping status: Never Used  Substance and Sexual Activity   Alcohol use: No   Drug use: No   Sexual activity: Yes    Birth control/protection: None  Other Topics Concern   Not on file  Social History Narrative   Not on file   Social Drivers of Health   Financial Resource Strain: Not on file  Food Insecurity: Not on file  Transportation Needs: Not on file  Physical Activity: Not on file  Stress: Not on file  Social Connections: Not on file  Intimate Partner Violence: Not on file       Objective:  There were no vitals taken for this visit. {Pulm Vitals (Optional):32837}  Physical Exam  Diagnostic Review:  {Labs (Optional):32838} PSG 09/25/21 >> AHI 43.8, SpO2 low 52% ONO with Bipap 10/09/21 >> test time 8 hrs 22 min.  Baseline SpO2 91%, low SpO2 73%.  Spent 1 hr 14 min with SpO2 < 88%.    Assessment & Plan:   Assessment & Plan   No orders of the defined types were placed in this encounter.     No follow-ups on file.   Jakori Burkett, MD

## 2024-01-19 ENCOUNTER — Ambulatory Visit: Admitting: Pulmonary Disease

## 2024-01-19 ENCOUNTER — Encounter: Payer: Self-pay | Admitting: Pulmonary Disease

## 2024-01-19 VITALS — BP 99/47 | HR 99 | Ht 65.0 in | Wt 320.0 lb

## 2024-01-19 DIAGNOSIS — G4733 Obstructive sleep apnea (adult) (pediatric): Secondary | ICD-10-CM | POA: Diagnosis not present

## 2024-01-19 DIAGNOSIS — Z6841 Body Mass Index (BMI) 40.0 and over, adult: Secondary | ICD-10-CM | POA: Diagnosis not present

## 2024-01-19 DIAGNOSIS — I10 Essential (primary) hypertension: Secondary | ICD-10-CM

## 2024-01-19 NOTE — Patient Instructions (Signed)
  VISIT SUMMARY: Today, you had your annual follow-up for maintaining your CPAP supplies. You have shown excellent compliance with your BiPAP therapy for sleep apnea, resulting in significant symptom improvement. Your hypertension and edema are well-managed with your current medications.  YOUR PLAN: OBSTRUCTIVE SLEEP APNEA: Your sleep apnea is well-controlled with BiPAP therapy, and you have experienced significant improvements in your symptoms. -Continue using your BiPAP machine with the current settings. -Ensure you have an annual follow-up to maintain your BiPAP equipment supply.  HYPERTENSION AND EDEMA: Your hypertension and edema are being managed effectively with your current diuretic medications. -Continue taking Lasix  (two tablets daily) and spironolactone  (half a tablet daily) as prescribed.  Contains text generated by [employer].
